# Patient Record
Sex: Male | Born: 1955
Health system: Southern US, Community
[De-identification: ages and names within clinical notes are randomized; demographics above are authoritative.]

## PROBLEM LIST (undated history)

## (undated) DIAGNOSIS — R972 Elevated prostate specific antigen [PSA]: Secondary | ICD-10-CM

## (undated) DIAGNOSIS — Z973 Presence of spectacles and contact lenses: Secondary | ICD-10-CM

## (undated) DIAGNOSIS — N138 Other obstructive and reflux uropathy: Secondary | ICD-10-CM

## (undated) DIAGNOSIS — E7800 Pure hypercholesterolemia, unspecified: Secondary | ICD-10-CM

## (undated) DIAGNOSIS — J40 Bronchitis, not specified as acute or chronic: Secondary | ICD-10-CM

## (undated) DIAGNOSIS — K219 Gastro-esophageal reflux disease without esophagitis: Secondary | ICD-10-CM

## (undated) DIAGNOSIS — R310 Gross hematuria: Secondary | ICD-10-CM

## (undated) DIAGNOSIS — N200 Calculus of kidney: Secondary | ICD-10-CM

## (undated) DIAGNOSIS — H15101 Unspecified episcleritis, right eye: Secondary | ICD-10-CM

## (undated) DIAGNOSIS — F329 Major depressive disorder, single episode, unspecified: Secondary | ICD-10-CM

## (undated) DIAGNOSIS — I10 Essential (primary) hypertension: Secondary | ICD-10-CM

## (undated) DIAGNOSIS — E291 Testicular hypofunction: Secondary | ICD-10-CM

## (undated) DIAGNOSIS — J189 Pneumonia, unspecified organism: Secondary | ICD-10-CM

## (undated) DIAGNOSIS — Z87442 Personal history of urinary calculi: Secondary | ICD-10-CM

## (undated) DIAGNOSIS — N401 Enlarged prostate with lower urinary tract symptoms: Secondary | ICD-10-CM

## (undated) DIAGNOSIS — M1712 Unilateral primary osteoarthritis, left knee: Secondary | ICD-10-CM

## (undated) DIAGNOSIS — L709 Acne, unspecified: Secondary | ICD-10-CM

## (undated) DIAGNOSIS — F32A Depression, unspecified: Secondary | ICD-10-CM

## (undated) DIAGNOSIS — R030 Elevated blood-pressure reading, without diagnosis of hypertension: Secondary | ICD-10-CM

## (undated) DIAGNOSIS — E785 Hyperlipidemia, unspecified: Secondary | ICD-10-CM

## (undated) DIAGNOSIS — K8020 Calculus of gallbladder without cholecystitis without obstruction: Secondary | ICD-10-CM

## (undated) DIAGNOSIS — F909 Attention-deficit hyperactivity disorder, unspecified type: Secondary | ICD-10-CM

## (undated) HISTORY — DX: Unspecified episcleritis, right eye: H15.101

## (undated) HISTORY — DX: Elevated prostate specific antigen (PSA): R97.20

## (undated) HISTORY — DX: Gastro-esophageal reflux disease without esophagitis: K21.9

## (undated) HISTORY — DX: Unilateral primary osteoarthritis, left knee: M17.12

## (undated) HISTORY — DX: Gross hematuria: R31.0

## (undated) HISTORY — PX: COLONOSCOPY: SHX174

## (undated) HISTORY — DX: Benign prostatic hyperplasia with lower urinary tract symptoms: N40.1

## (undated) HISTORY — DX: Major depressive disorder, single episode, unspecified: F32.9

## (undated) HISTORY — PX: PROSTATE BIOPSY: SHX241

## (undated) HISTORY — DX: Attention-deficit hyperactivity disorder, unspecified type: F90.9

## (undated) HISTORY — DX: Calculus of kidney: N20.0

## (undated) HISTORY — DX: Calculus of gallbladder without cholecystitis without obstruction: K80.20

## (undated) HISTORY — DX: Hyperlipidemia, unspecified: E78.5

## (undated) HISTORY — PX: LITHOTRIPSY: SUR834

## (undated) HISTORY — DX: Acne, unspecified: L70.9

## (undated) HISTORY — DX: Elevated blood-pressure reading, without diagnosis of hypertension: R03.0

## (undated) HISTORY — PX: FRACTURE SURGERY: SHX138

## (undated) HISTORY — DX: Depression, unspecified: F32.A

## (undated) HISTORY — DX: Other obstructive and reflux uropathy: N13.8

## (undated) HISTORY — PX: THROAT SURGERY: SHX803

## (undated) HISTORY — DX: Bronchitis, not specified as acute or chronic: J40

## (undated) HISTORY — DX: Testicular hypofunction: E29.1

## (undated) HISTORY — PX: KNEE ARTHROSCOPY: SHX127

## (undated) HISTORY — PX: ROTATOR CUFF REPAIR: SHX139

---
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
## 2002-08-24 HISTORY — PX: ROTATOR CUFF REPAIR: SHX139

## 2006-01-01 ENCOUNTER — Inpatient Hospital Stay (HOSPITAL_COMMUNITY): Admission: EM | Admit: 2006-01-01 | Discharge: 2006-01-04 | Payer: Self-pay | Admitting: Emergency Medicine

## 2007-08-25 DIAGNOSIS — E291 Testicular hypofunction: Secondary | ICD-10-CM

## 2007-08-25 HISTORY — DX: Testicular hypofunction: E29.1

## 2008-05-10 LAB — HM COLONOSCOPY

## 2008-12-07 ENCOUNTER — Encounter: Admission: RE | Admit: 2008-12-07 | Discharge: 2008-12-07 | Payer: Self-pay | Admitting: Orthopedic Surgery

## 2011-01-09 NOTE — H&P (Signed)
NAME:  JT, BRABEC              ACCOUNT NO.:  [id number]   MEDICAL RECORD NO.:  [id number]          PATIENT TYPE:  INP   LOCATION:  1823                         FACILITY:  [facility]   PHYSICIAN:  Jefry H. Pollyann Kennedy, MD     DATE OF BIRTH:  August 08, 1956   DATE OF ADMISSION:  01/01/2006  DATE OF DISCHARGE:                                HISTORY & PHYSICAL   TIME AND PLACE OF ADM:  10 p.m., [facility].   REASON FOR ADMISSION:  Retropharyngeal abscess.   HISTORY:  This is a 55 year old gentleman who began having a severe sore  throat, difficulty swallowing, and difficulty turning his head because of  neck discomfort on Tuesday, 3 days prior to today.  His symptoms have  progressed, gotten worse, and he went to an Urgent Care earlier today where  recommendation was made that he go to the emergency department for workup of  possible meningitis.  In the emergency department, he was found to have a  left shift on his differential but no significantly elevated white blood  cell count.  He was also found on CT scan of the neck to have a  retropharyngeal fluid collection.   PAST MEDICAL HISTORY:  Negative.   PAST SURGICAL HISTORY:  Multiple orthopedic surgeries.   MEDICATIONS:  None.   ALLERGIES:  SULFA causes a rash.   SOCIAL HISTORY:  No history of smoking or alcohol use.  The patient and his  wife and family recently moved here from [place].   PHYSICAL EXAMINATION:  He is a healthy-appearing gentleman lying on his side  guarding his neck and head, trying very hard not to move.  There are no  palpable neck masses.  There is no swelling or erythema of the neck.  There  is no trismus.  Oral cavity and pharynx reveal healthy-appearing tongue,  soft palate, and lateral pharyngeal walls.  There is some fullness of the  posterior pharyngeal wall and the oropharynx.  Nasal exam clear.   IMPRESSION:  Suspected retropharyngeal abscess.   PLAN:  Admit to the hospital.  Perform incision and drainage under anesthesia  in the operating room and then continue on intravenous support and  intravenous antibiotic.      Jefry H. Pollyann Kennedy, MD  Electronically Signed    JHR/MEDQ  D:  01/01/2006  T:  01/01/2006  Job:  [id number]

## 2011-01-09 NOTE — Op Note (Signed)
Carlos Ellis, Carlos Ellis              ACCOUNT NO.:  [id number]   MEDICAL RECORD NO.:  [id number]          PATIENT TYPE:  INP   LOCATION:  3027                         FACILITY:  [facility]   PHYSICIAN:  Jefry H. Pollyann Kennedy, MD     DATE OF BIRTH:  October 06, 1955   DATE OF PROCEDURE:  01/01/2006  DATE OF DISCHARGE:                                 OPERATIVE REPORT   PREOPERATIVE DIAGNOSIS:  Suspected retropharyngeal abscess.   POSTOPERATIVE DIAGNOSIS:  Suspected retropharyngeal abscess.   OPERATION/PROCEDURE:  Incision and drainage of suspected retropharyngeal  abscess.   SURGEON:  Jefry H. Pollyann Kennedy, M.D.   ANESTHESIA:  General endotracheal anesthesia.   COMPLICATIONS:  None.   FINDINGS:  Edematous, boggytype tissue of the posterior pharynx without a  discrete abscess cavity identified.  No complications.   SPECIMENS:  Specimen sent for culture and sensitivity.   DISPOSITION:  The patient tolerated the procedure well.  Was awakened and  extubated and transferred to the recovery room in stable condition.   INDICATIONS:  This is a 55 year old gentleman with a three-day history of  sore throat, difficulty swallowing and trouble turning his head.  The CT  scan imaging in the emergency department revealed food collection in the  retropharyngeal space.  Risks, benefits and alternatives and complications  of the procedure were explained to the patient and his wife who seemed to  understand and agreed to surgery.   DESCRIPTION OF PROCEDURE:  The patient was taken to the operating room and  placed on the operating table in the supine position.  Following induction  of general endotracheal anesthesia, head was turned.  The patient was draped  in the standard fashion.  A Crowe-Davis mouth gag was inserted into the oral  cavity, used for retracting the tongue and mandible and attached to the Mayo  stand. Red rubber catheter was inserted into the right side of the nose and  withdrawn through the mouth and  used to retract the soft palate and uvula.  One percent Xylocaine with epinephrine was infiltrated into the mucosa of  the posterior pharyngeal wall.  An 18-gauge needle was used on a 5 mL  syringe to aspirate in multiple areas to try to obtain sample of the pus.  Very small amount of cloudy fluid was obtained and was sent for culture and  sensitivity testing.  During the aspiration, the needle inadvertently  punctured the endotracheal tube cuff line which deflated the cuff.  The  endotracheal tube was placed without difficulty.  A 15-  scalpel was used to make the vertical incision in the posterior pharyngeal  wall mucosa.  Hemostat was then used to spread open the retropharyngeal  tissues.  Again no distinct abscess cavity was identified.  There was no  bleeding.  The patient was then awakened from anesthesia, extubated and  transferred to recovery in stable condition.      Jefry H. Pollyann Kennedy, MD  Electronically Signed     JHR/MEDQ  D:  01/02/2006  T:  01/04/2006  Job:  [id number]

## 2011-01-09 NOTE — Discharge Summary (Signed)
NAMEBRAYTON, BAUMGARTNER              ACCOUNT NO.:  [id number]   MEDICAL RECORD NO.:  [id number]          PATIENT TYPE:  INP   LOCATION:  3027                         FACILITY:  [facility]   PHYSICIAN:  Jefry H. Pollyann Kennedy, MD     DATE OF BIRTH:  12-17-1955   DATE OF ADMISSION:  01/01/2006  DATE OF DISCHARGE:  01/04/2006                                 DISCHARGE SUMMARY   ADMISSION DIAGNOSIS:  Suspected retropharyngeal abscess.   DISCHARGE DIAGNOSIS:  Status post incision and drainage of retropharyngeal  cellulitis.   PROCEDURE DURING STAY:  Incision and drainage of retropharyngeal space.   COMPLICATIONS:  None.   CONSULTATIONS:  None.   HISTORY:  This is a 55 year old who was admitted through the emergency  department  on May 11th with clinical evidence of retropharyngeal space  abscess. He underwent the above-mentioned procedure on the day of admission.  Following that he remained in the hospital and general nursing floor on  intravenous antibiotics and slowly improved to where he was able to turn his  head to the side better, able to eat and drink a little bit better. He was  discharged to home on the 14th with instructions to continue on Augmentin on  10 days, use narcotic analgesics that were prescribed and to follow up with  me later in the week.      Jefry H. Pollyann Kennedy, MD  Electronically Signed     JHR/MEDQ  D:  01/04/2006  T:  01/04/2006  Job:  [id number]

## 2011-05-18 ENCOUNTER — Encounter: Payer: Self-pay | Admitting: Family Medicine

## 2011-05-18 ENCOUNTER — Ambulatory Visit (INDEPENDENT_AMBULATORY_CARE_PROVIDER_SITE_OTHER): Payer: BC Managed Care – PPO | Admitting: Family Medicine

## 2011-05-18 ENCOUNTER — Telehealth: Payer: Self-pay | Admitting: Family Medicine

## 2011-05-18 VITALS — BP 118/78 | HR 88 | Temp 97.7°F | Wt 260.0 lb

## 2011-05-18 DIAGNOSIS — E785 Hyperlipidemia, unspecified: Secondary | ICD-10-CM

## 2011-05-18 DIAGNOSIS — Z23 Encounter for immunization: Secondary | ICD-10-CM

## 2011-05-18 DIAGNOSIS — E291 Testicular hypofunction: Secondary | ICD-10-CM

## 2011-05-18 MED ORDER — TESTOSTERONE 12.5 MG/ACT (1%) TD GEL: 5.0000 g | Freq: Every day | TRANSDERMAL | Status: DC

## 2011-05-18 MED ORDER — TESTOSTERONE CYPIONATE 200 MG/ML IM SOLN: 200.0000 mg | INTRAMUSCULAR | Status: AC

## 2011-05-18 NOTE — Telephone Encounter (Signed)
Pls request records from [facility] in [place].  Thx--PM

## 2011-05-18 NOTE — Assessment & Plan Note (Signed)
Will check prolactin and FLP as soon as he can. Rx's given for Androgel and testosterone injections: he'll see which of these will be the most viable option financially when he goes to his pharmacy (he has a very high rx med deductable). If he chooses androgel, he'll come for next f/u in about 2-3 wks for o/v and testosterone level. If he chooses testost injections, he'll come in here for first one, then have his wife give the next one in 2 wks, then f/u in office for o/v and testos level 1 week after this second injection.

## 2011-05-18 NOTE — Assessment & Plan Note (Signed)
Obtain old records. Check FLP ASAP.

## 2011-05-18 NOTE — Progress Notes (Signed)
Office Note 05/18/2011  CC:  Chief Complaint  Patient presents with  . Establish Care    low testosterone    HPI:  Carlos Ellis is a 55 y.o. White male who is here to establish care and discuss low testosterone. Patient's most recent primary MD: Brooks Sailors in [place]. Old records (labs from NP at [facility]) were reviewed during today's visit.  Has suffered from mild depression, lack of motivation, poor energy level over the last couple of years, somewhat associated with poor economy and job seeking. Also has hx of low testosterone a few years ago and took injections for a while but due to lack of effective communication at his former PMD office he decided to d/c the med.  Says he never felt much improvement from it anyway.  At counseling center recently, the NP checked some labs and his testosterone came back low again and they recommended he f/u with his primary care MD about this.  Denies ED.    Past Medical History  Diagnosis Date  . Depression   . Borderline hyperlipidemia     Past Surgical History  Procedure Date  . Knee arthroscopy     Left: 1990s and early 2000's.  . Rotator cuff repair     Right  . Lithotripsy     1990s  . Throat surgery     Infected branchial cleft cyst 2007  . Colonoscopy     X 2, normal (for strong FH of colon cancer.  Last in 2009 was normal.  Repeat 5 yrs ([facility]).    No family history on file.  History   Social History  . Marital Status: Married    Spouse Name: N/A    Number of Children: N/A  . Years of Education: N/A   Occupational History  . Not on file.   Social History Main Topics  . Smoking status: Never Smoker   . Smokeless tobacco: Never Used  . Alcohol Use: 1.8 oz/week    3 Glasses of wine per week  . Drug Use: No  . Sexually Active: Not on file   Other Topics Concern  . 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  Drinks 2-3 glasses of wine per week.  No drugs.NO exercise.    Outpatient Encounter Prescriptions as of 05/18/2011  Medication Sig Dispense Refill  . citalopram (CELEXA) 20 MG tablet Take 20 mg by mouth daily.        . Testosterone (ANDROGEL PUMP) 1.25 GM/ACT (1%) GEL Place 5 g onto the skin daily.  75 g  5  . testosterone cypionate (DEPOTESTOTERONE CYPIONATE) 200 MG/ML injection Inject 1 mL (200 mg total) into the muscle every 14 (fourteen) days.  10 mL  5    Allergies  Allergen Reactions  . Sulfa Antibiotics     Can't remember reaction    ROS Review of Systems  Constitutional: Negative for fever and fatigue.  HENT: Negative for congestion and sore throat.   Eyes: Negative for visual disturbance.  Respiratory: Negative for cough.   Cardiovascular: Negative for chest pain.  Gastrointestinal: Negative for nausea and abdominal pain.  Genitourinary: Negative for dysuria.  Musculoskeletal: Negative for back pain and joint swelling.  Skin: Negative for rash.  Neurological: Negative for weakness and headaches.  Hematological:  Negative for adenopathy.     PE; Blood pressure 118/78, pulse 88, temperature 97.7 F (36.5 C), temperature source Oral, weight 260 lb (117.935 kg), SpO2 95.00%. Gen: Alert, well appearing.  Patient is oriented to person, place, time, and situation. HEENT: Scalp without lesions or hair loss.  Ears: EACs clear, normal epithelium.  TMs with good light reflex and landmarks bilaterally.  Eyes: no injection, icteris, swelling, or exudate.  EOMI, PERRLA. Nose: no drainage or turbinate edema/swelling.  No injection or focal lesion.  Mouth: lips without lesion/swelling.  Oral mucosa pink and moist.  Dentition intact and without obvious caries or gingival swelling.  Oropharynx without erythema, exudate, or swelling.  Neck: supple,  ROM full. No lymphadenopathy, thyromegaly, or mass. Chest: symmetric expansion, nonlabored respirations.  Clear and equal breath sounds in all lung fields.   CV: RRR, no m/r/g.  Peripheral pulses 2+ and symmetric. ABD: soft, NT, ND, BS normal.  No hepatospenomegaly or mass.  No bruits. EXT: no clubbing, cyanosis, or edema.   Pertinent labs:  None here today. Testost 05/13/11 via [facility] was 203.52 ng/dl.  Remainder of labs from that date were wnl (CBC, CMET, TSH, T4, vit B12, folate, and vitamin D).  ASSESSMENT AND PLAN:   New patient: obtain old records.  Hypogonadism, male Will check prolactin and FLP as soon as he can. Rx's given for Androgel and testosterone injections: he'll see which of these will be the most viable option financially when he goes to his pharmacy (he has a very high rx med deductable). If he chooses androgel, he'll come for next f/u in about 2-3 wks for o/v and testosterone level. If he chooses testost injections, he'll come in here for first one, then have his wife give the next one in 2 wks, then f/u in office for o/v and testos level 1 week after this second injection.  Borderline hyperlipidemia Obtain old records. Check FLP ASAP.   Depression: recently started on citalopram by therapist/NP at [facility], FNP.  Masoud Hejazi is the MD there but patient states he has not seen him).  I told patient he could f/u with them or with me for this problem, either one is fine.  Return for lab visit for fasting lipids and prolactin level ASAP, and office follow up to be determined.

## 2011-05-19 ENCOUNTER — Other Ambulatory Visit (INDEPENDENT_AMBULATORY_CARE_PROVIDER_SITE_OTHER): Payer: BC Managed Care – PPO

## 2011-05-19 DIAGNOSIS — E785 Hyperlipidemia, unspecified: Secondary | ICD-10-CM

## 2011-05-19 DIAGNOSIS — E291 Testicular hypofunction: Secondary | ICD-10-CM

## 2011-05-19 LAB — PROLACTIN: Prolactin: 7.2 ng/mL (ref 2.1–17.1)

## 2011-05-19 LAB — LIPID PANEL
Cholesterol: 160 mg/dL (ref 0–200)
HDL: 48 mg/dL (ref >39.00)
LDL Cholesterol: 98 mg/dL (ref 0–99)
Total CHOL/HDL Ratio: 3
Triglycerides: 69 mg/dL (ref 0.0–149.0)
VLDL: 13.8 mg/dL (ref 0.0–40.0)

## 2011-05-19 NOTE — Telephone Encounter (Signed)
Faxed request

## 2011-05-26 ENCOUNTER — Encounter: Payer: Self-pay | Admitting: Family Medicine

## 2011-06-02 ENCOUNTER — Telehealth: Payer: Self-pay | Admitting: Family Medicine

## 2011-06-02 NOTE — Telephone Encounter (Signed)
Pt will have injections.  Appt scheduled for tomorrow at 10:00am.  Advised next OV will be after 2nd injection.

## 2011-06-02 NOTE — Telephone Encounter (Signed)
Patient is asking when he should schedule his "injection" and his next OV

## 2011-06-03 ENCOUNTER — Ambulatory Visit: Payer: BC Managed Care – PPO

## 2011-06-04 ENCOUNTER — Encounter: Payer: Self-pay | Admitting: Family Medicine

## 2011-10-06 ENCOUNTER — Encounter: Payer: Self-pay | Admitting: Family Medicine

## 2011-10-06 ENCOUNTER — Ambulatory Visit (INDEPENDENT_AMBULATORY_CARE_PROVIDER_SITE_OTHER): Payer: Managed Care, Other (non HMO) | Admitting: Family Medicine

## 2011-10-06 DIAGNOSIS — L259 Unspecified contact dermatitis, unspecified cause: Secondary | ICD-10-CM

## 2011-10-06 DIAGNOSIS — L309 Dermatitis, unspecified: Secondary | ICD-10-CM

## 2011-10-06 DIAGNOSIS — J40 Bronchitis, not specified as acute or chronic: Secondary | ICD-10-CM

## 2011-10-06 HISTORY — DX: Bronchitis, not specified as acute or chronic: J40

## 2011-10-06 MED ORDER — NYSTATIN 100000 UNIT/GM EX CREA: TOPICAL_CREAM | Freq: Two times a day (BID) | CUTANEOUS | Status: DC

## 2011-10-06 MED ORDER — HYDROCOD POLST-CHLORPHEN POLST 10-8 MG/5ML PO LQCR: 5.0000 mL | Freq: Two times a day (BID) | ORAL | Status: DC | PRN

## 2011-10-06 MED ORDER — AMOXICILLIN-POT CLAVULANATE 875-125 MG PO TABS: 1.0000 | ORAL_TABLET | Freq: Two times a day (BID) | ORAL | Status: AC

## 2011-10-06 MED ORDER — GUAIFENESIN ER 600 MG PO TB12: 1200.0000 mg | ORAL_TABLET | Freq: Two times a day (BID) | ORAL | Status: DC

## 2011-10-06 MED ORDER — ALIGN PO CAPS: 1.0000 | ORAL_CAPSULE | Freq: Every day | ORAL | Status: DC

## 2011-10-06 NOTE — Patient Instructions (Signed)

## 2011-10-07 ENCOUNTER — Encounter: Payer: Self-pay | Admitting: Family Medicine

## 2011-10-07 DIAGNOSIS — K219 Gastro-esophageal reflux disease without esophagitis: Secondary | ICD-10-CM | POA: Insufficient documentation

## 2011-10-07 DIAGNOSIS — F329 Major depressive disorder, single episode, unspecified: Secondary | ICD-10-CM | POA: Insufficient documentation

## 2011-10-07 DIAGNOSIS — H15101 Unspecified episcleritis, right eye: Secondary | ICD-10-CM | POA: Insufficient documentation

## 2011-10-07 DIAGNOSIS — F32A Depression, unspecified: Secondary | ICD-10-CM | POA: Insufficient documentation

## 2011-10-07 DIAGNOSIS — M1990 Unspecified osteoarthritis, unspecified site: Secondary | ICD-10-CM | POA: Insufficient documentation

## 2011-10-07 DIAGNOSIS — L709 Acne, unspecified: Secondary | ICD-10-CM | POA: Insufficient documentation

## 2011-10-07 DIAGNOSIS — N200 Calculus of kidney: Secondary | ICD-10-CM | POA: Insufficient documentation

## 2011-10-07 DIAGNOSIS — M109 Gout, unspecified: Secondary | ICD-10-CM | POA: Insufficient documentation

## 2011-10-07 NOTE — Assessment & Plan Note (Signed)
Increase rest and fluids, start Augmentin and Mucinex and report if symptoms worsen

## 2011-10-07 NOTE — Progress Notes (Signed)
Patient ID: Carlos Ellis, male   DOB: [birth year], 56 y.o.   MRN: [id number] Carlos Ellis [id number] [birth year] 10/07/2011      Progress Note-Follow Up  Subjective  Chief Complaint  Chief Complaint  Patient presents with  . URI    HPI  Patient is a 56 year old Caucasian male who is in today with roughly two-week history of worsening respiratory symptoms. Has been struggling with head congestion and rhinorrhea. Malaise and myalgias also present on and off for the last several days it moved into his chest and he is having more coughing and rattling in his chest. No high-grade fevers and chills but some low-grade ones for possible. No chest pain or palpitations. No shortness of breath or wheezing. No ear pain but some throat irritation is noted. He has not taken any significant medications thus far for that illness but is concerned about the length of time he's had the symptoms.  Past Medical History  Diagnosis Date  . Borderline hyperlipidemia   . Gout   . Acne     Dr. Katrinka Blazing (was on accutane at one point)  . Episcleritis of right eye     w/mild scleritis right eye ([facility] opht 2011); also with hx of ocular varicella at age 56.  . Hypogonadism male 2009  . Bronchitis 10/06/2011  . Osteoarthritis     Dr. Turner Daniels  . Depression     Zoloft 2005  . GERD (gastroesophageal reflux disease)   . Nephrolithiasis     Past Surgical History  Procedure Date  . Knee arthroscopy     Left: 1990s and early 2000's.  . Rotator cuff repair     Right  . Lithotripsy     1990s  . Throat surgery     Infected branchial cleft cyst 2007  . Colonoscopy     X 2, normal (for strong FH of colon cancer.  Last in 2009 was normal.  Repeat 10 yrs ([facility]).    History reviewed. No pertinent family history.  History   Social History  . Marital Status: Married    Spouse Name: N/A    Number of Children: N/A  . Years of Education: N/A   Occupational History  . Not on file.   Social History Main Topics    . Smoking status: Never Smoker   . Smokeless tobacco: Never Used  . Alcohol Use: 1.8 oz/week    3 Glasses of wine per week  . Drug Use: No  . Sexually Active: Not on file   Other Topics Concern  . 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  Drinks 2-3 glasses of wine per week.  No drugs.NO exercise.    Current Outpatient Prescriptions on File Prior to Visit  Medication Sig Dispense Refill  . citalopram (CELEXA) 20 MG tablet Take 20 mg by mouth daily.        . Testosterone (ANDROGEL PUMP) 1.25 GM/ACT (1%) GEL Place 5 g onto the skin daily.  75 g  5    Allergies  Allergen Reactions  . Sulfa Antibiotics     Can't remember reaction    Review of Systems  Review of Systems  Constitutional: Positive for malaise/fatigue. Negative for fever.  HENT: Positive for congestion and sore throat.   Eyes: Negative for discharge.  Respiratory: Positive  for cough and sputum production. Negative for shortness of breath.   Cardiovascular: Negative for chest pain, palpitations and leg swelling.  Gastrointestinal: Negative for nausea, abdominal pain and diarrhea.  Genitourinary: Negative for dysuria.  Musculoskeletal: Positive for myalgias. Negative for falls.  Skin: Negative for rash.  Neurological: Positive for headaches. Negative for loss of consciousness.  Endo/Heme/Allergies: Negative for polydipsia.  Psychiatric/Behavioral: Negative for depression and suicidal ideas. The patient is not nervous/anxious and does not have insomnia.     Objective  BP 132/85  Pulse 92  Temp(Src) 99.6 F (37.6 C) (Temporal)  Wt 263 lb (119.296 kg)  SpO2 96%  Physical Exam  Physical Exam  Constitutional: He is oriented to person, place, and time and well-developed, well-nourished, and in no  distress. No distress.  HENT:  Head: Normocephalic and atraumatic.  Eyes: Conjunctivae are normal.  Neck: Neck supple. No thyromegaly present.  Cardiovascular: Normal rate, regular rhythm and normal heart sounds.   No murmur heard. Pulmonary/Chest: Effort normal and breath sounds normal. No respiratory distress.       Coughing, decreased bs b/l bases  Abdominal: He exhibits no distension and no mass. There is no tenderness.  Musculoskeletal: He exhibits no edema.  Neurological: He is alert and oriented to person, place, and time.  Skin: Skin is warm.  Psychiatric: Memory, affect and judgment normal.     Lab Results  Component Value Date   CHOL 160 05/19/2011   Lab Results  Component Value Date   HDL 48.00 05/19/2011   Lab Results  Component Value Date   LDLCALC 98 05/19/2011   Lab Results  Component Value Date   TRIG 69.0 05/19/2011   Lab Results  Component Value Date   CHOLHDL 3 05/19/2011     Assessment & Plan  Bronchitis Increase rest and fluids, start Augmentin and Mucinex and report if symptoms worsen

## 2012-02-12 ENCOUNTER — Encounter: Payer: Self-pay | Admitting: Family Medicine

## 2012-02-12 ENCOUNTER — Ambulatory Visit (INDEPENDENT_AMBULATORY_CARE_PROVIDER_SITE_OTHER): Payer: Managed Care, Other (non HMO) | Admitting: Family Medicine

## 2012-02-12 ENCOUNTER — Ambulatory Visit: Payer: Managed Care, Other (non HMO)

## 2012-02-12 VITALS — BP 127/80 | HR 83 | Temp 97.2°F | Wt 260.0 lb

## 2012-02-12 DIAGNOSIS — M1990 Unspecified osteoarthritis, unspecified site: Secondary | ICD-10-CM

## 2012-02-12 DIAGNOSIS — Z0000 Encounter for general adult medical examination without abnormal findings: Secondary | ICD-10-CM

## 2012-02-12 DIAGNOSIS — E291 Testicular hypofunction: Secondary | ICD-10-CM

## 2012-02-12 MED ORDER — MELOXICAM 7.5 MG PO TABS: ORAL_TABLET | ORAL | Status: DC

## 2012-02-12 NOTE — Progress Notes (Signed)
Office Note 02/14/2012  CC:  Chief Complaint  Patient presents with  . Annual Exam    BSA physical form    HPI:  Carlos Ellis is a 56 y.o. White male who is here for CPE/ [employer] form to fill out.  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   Reports no new problems.  Still struggling with his weight, depression is stable, feels like his left knee pains are bothering him more lately: he describes intermittent pain, locking/catching with deep flexion and extension and says it does not swell or turn red.  No giving way. He tries advil occasionally but says past rx of mobic helped quite a bit and asks to get back on this, plus he says he'll likely go back to Dr. Turner Daniels, his orthopedist, soon.     Past Medical History  Diagnosis Date  . Borderline hyperlipidemia   . Gout   . Acne     Dr. Katrinka Blazing (was on accutane at one point)  . Episcleritis of right eye     w/mild scleritis right eye ([facility] opht 2011); also with hx of ocular varicella at age 19.  . Hypogonadism male 2009  . Bronchitis 10/06/2011  . Osteoarthritis     Dr. Turner Daniels  . Depression     Zoloft 2005  . GERD (gastroesophageal reflux disease)   . Nephrolithiasis     Past Surgical History  Procedure Date  . Knee arthroscopy     Left: 1990s and early 2000's.  . Rotator cuff repair     Right  . Lithotripsy     1990s  . Throat surgery     Infected branchial cleft cyst 2007  . Colonoscopy     X 2, normal (for strong FH of colon cancer.  Last in 2009 was normal.  Repeat 10 yrs ([facility]).    History reviewed. No pertinent family history.  History   Social History  . Marital Status: Married    Spouse Name: N/A    Number of Children: N/A  . Years of Education: N/A   Occupational History  . Not on file.   Social History Main Topics  . Smoking status: Never Smoker   . Smokeless tobacco: Never Used  . Alcohol Use: 1.8 oz/week    3 Glasses of wine per week    . Drug Use: No  . Sexually Active: Not on file   Other Topics Concern  . 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  Drinks 2-3 glasses of wine per week.  No drugs.NO exercise.    MEDS: citalopram 20mg  qd, advil prn,   Allergies  Allergen Reactions  . Sulfa Antibiotics     Can't remember reaction    ROS Review of Systems  Constitutional: Negative for fever, chills, appetite change and fatigue.  HENT: Negative for ear pain, congestion, sore throat, neck stiffness and dental problem.   Eyes: Negative for discharge, redness and visual disturbance.  Respiratory: Negative for cough, chest tightness, shortness of breath and wheezing.   Cardiovascular: Negative for chest pain, palpitations and leg swelling.  Gastrointestinal: Negative for nausea, vomiting, abdominal pain, diarrhea and blood in stool.  Genitourinary: Negative for dysuria, urgency, frequency, hematuria, flank pain and difficulty urinating.  Musculoskeletal: Positive for  arthralgias (left knee). Negative for myalgias, back pain and joint swelling.  Skin: Negative for pallor and rash.  Neurological: Negative for dizziness, speech difficulty, weakness and headaches.  Hematological: Negative for adenopathy. Does not bruise/bleed easily.  Psychiatric/Behavioral: Negative for confusion and disturbed wake/sleep cycle. The patient is not nervous/anxious.     PE; Blood pressure 127/80, pulse 83, temperature 97.2 F (36.2 C), temperature source Temporal, weight 260 lb (117.935 kg). Gen: Alert, well appearing.  Patient is oriented to person, place, time, and situation. ENT: Ears: EACs clear, normal epithelium.  TMs with good light reflex and landmarks bilaterally.  Eyes: no injection, icteris, swelling, or exudate.   EOMI, PERRLA. Nose: no drainage or turbinate edema/swelling.  No injection or focal lesion.  Mouth: lips without lesion/swelling.  Oral mucosa pink and moist.  Dentition intact and without obvious caries or gingival swelling.  Oropharynx without erythema, exudate, or swelling.  Neck: supple/nontender.  No LAD, mass, or TM.  Carotid pulses 2+ bilaterally, without bruits. CV: RRR, no m/r/g.   LUNGS: CTA bilat, nonlabored resps, good aeration in all lung fields. ABD: soft, NT, ND, BS normal.  No hepatospenomegaly or mass.  No bruits. EXT: no clubbing, cyanosis, or edema.  Musculoskeletal: no joint swelling, erythema, warmth, or tenderness.  ROM of all joints intact. Left knee with mild medial joint line tenderness.  No instability.  Varus and valgus stress testing reveal no pain or laxity. Neuro: CN 2-12 intact bilaterally, strength 5/5 in proximal and distal upper extremities and lower extremities bilaterally.  No sensory deficits.  No tremor.  No disdiadochokinesis.  No ataxia.  Upper extremity and lower extremity DTRs symmetric.  No pronator drift. Genitals normal; both testes normal without tenderness, masses, hydroceles, varicoceles, erythema or swelling. Shaft normal, circumcised, meatus normal without discharge. No inguinal hernia noted. No inguinal lymphadenopathy. Rectal exam: negative without mass, lesions or tenderness.  Prostate without enlargement, tenderness, or nodule.  Pertinent labs:  Lab Results  Component Value Date   CHOL 160 05/19/2011   HDL 48.00 05/19/2011   LDLCALC 98 05/19/2011   TRIG 69.0 05/19/2011   CHOLHDL 3 05/19/2011     ASSESSMENT AND PLAN:   Health maintenance examination Reviewed age and gender appropriate health maintenance issues (prudent diet, regular exercise, health risks of tobacco and excessive alcohol, use of seatbelts, fire alarms in home, use of sunscreen).  Also reviewed age and gender appropriate health screening as well as vaccine  recommendations. He's UTD on vaccines and routine screening blood work.   Recheck in office in 3-4 mo for routine f/u and fasting labs.  Osteoarthritis Will restart meloxicam 7.5mg -15mg  qd prn. Encouraged pt to go back to Dr. Turner Daniels, his orthopedist, to get reappraisal of the knee and see if anything else needs to be pursued regarding treatment/therapy.  Hypogonadism, male He wants to return and restart testosterone injections.  After initial shot by [facility], he thinks he will decide to contine q2 wk injections but give them himself at home.    FOLLOW UP:  Return for office f/u + fasting labs in 3-4 months.  Nurse visit for testosterone injection at his convenience.Marland Kitchen

## 2012-02-14 DIAGNOSIS — Z0000 Encounter for general adult medical examination without abnormal findings: Secondary | ICD-10-CM | POA: Insufficient documentation

## 2012-02-14 NOTE — Assessment & Plan Note (Signed)
Will restart meloxicam 7.5mg -15mg  qd prn. Encouraged pt to go back to Dr. Turner Daniels, his orthopedist, to get reappraisal of the knee and see if anything else needs to be pursued regarding treatment/therapy.

## 2012-02-14 NOTE — Assessment & Plan Note (Signed)
He wants to return and restart testosterone injections.  After initial shot by [facility], he thinks he will decide to contine q2 wk injections but give them himself at home.

## 2012-02-14 NOTE — Assessment & Plan Note (Signed)
Reviewed age and gender appropriate health maintenance issues (prudent diet, regular exercise, health risks of tobacco and excessive alcohol, use of seatbelts, fire alarms in home, use of sunscreen).  Also reviewed age and gender appropriate health screening as well as vaccine recommendations. He's UTD on vaccines and routine screening blood work.   Recheck in office in 3-4 mo for routine f/u and fasting labs.

## 2012-05-13 ENCOUNTER — Ambulatory Visit: Payer: Managed Care, Other (non HMO) | Admitting: Family Medicine

## 2013-08-24 DIAGNOSIS — R972 Elevated prostate specific antigen [PSA]: Secondary | ICD-10-CM

## 2013-08-24 DIAGNOSIS — N138 Other obstructive and reflux uropathy: Secondary | ICD-10-CM

## 2013-08-24 HISTORY — DX: Elevated prostate specific antigen (PSA): R97.20

## 2013-08-24 HISTORY — DX: Benign prostatic hyperplasia with lower urinary tract symptoms: N13.8

## 2013-10-13 ENCOUNTER — Ambulatory Visit (INDEPENDENT_AMBULATORY_CARE_PROVIDER_SITE_OTHER): Payer: BC Managed Care – PPO | Admitting: Family Medicine

## 2013-10-13 ENCOUNTER — Encounter: Payer: Self-pay | Admitting: Family Medicine

## 2013-10-13 VITALS — BP 148/85 | HR 80 | Temp 98.0°F | Resp 16 | Ht 71.5 in | Wt 266.0 lb

## 2013-10-13 DIAGNOSIS — Z205 Contact with and (suspected) exposure to viral hepatitis: Secondary | ICD-10-CM

## 2013-10-13 DIAGNOSIS — Z0000 Encounter for general adult medical examination without abnormal findings: Secondary | ICD-10-CM

## 2013-10-13 DIAGNOSIS — Z23 Encounter for immunization: Secondary | ICD-10-CM

## 2013-10-13 DIAGNOSIS — Z20828 Contact with and (suspected) exposure to other viral communicable diseases: Secondary | ICD-10-CM

## 2013-10-13 DIAGNOSIS — Z125 Encounter for screening for malignant neoplasm of prostate: Secondary | ICD-10-CM

## 2013-10-13 LAB — LIPID PANEL
Cholesterol: 181 mg/dL (ref 0–200)
HDL: 48.5 mg/dL (ref >39.00)
LDL Cholesterol: 113 mg/dL — ABNORMAL HIGH (ref 0–99)
Total CHOL/HDL Ratio: 4
Triglycerides: 100 mg/dL (ref 0.0–149.0)
VLDL: 20 mg/dL (ref 0.0–40.0)

## 2013-10-13 LAB — CBC WITH DIFFERENTIAL/PLATELET
Basophils Absolute: 0 10*3/uL (ref 0.0–0.1)
Basophils Relative: 0.4 % (ref 0.0–3.0)
Eosinophils Absolute: 0.1 10*3/uL (ref 0.0–0.7)
Eosinophils Relative: 1.6 % (ref 0.0–5.0)
HCT: 46.8 % (ref 39.0–52.0)
Hemoglobin: 15.5 g/dL (ref 13.0–17.0)
Lymphocytes Relative: 17.3 % (ref 12.0–46.0)
Lymphs Abs: 1.4 10*3/uL (ref 0.7–4.0)
MCHC: 33 g/dL (ref 30.0–36.0)
MCV: 90.3 fl (ref 78.0–100.0)
Monocytes Absolute: 0.5 10*3/uL (ref 0.1–1.0)
Monocytes Relative: 6.1 % (ref 3.0–12.0)
Neutro Abs: 6.2 10*3/uL (ref 1.4–7.7)
Neutrophils Relative %: 74.6 % (ref 43.0–77.0)
Platelets: 293 10*3/uL (ref 150.0–400.0)
RBC: 5.19 Mil/uL (ref 4.22–5.81)
RDW: 14 % (ref 11.5–14.6)
WBC: 8.3 10*3/uL (ref 4.5–10.5)

## 2013-10-13 LAB — PSA: PSA: 3.65 ng/mL (ref 0.10–4.00)

## 2013-10-13 LAB — COMPREHENSIVE METABOLIC PANEL
ALT: 36 U/L (ref 0–53)
AST: 27 U/L (ref 0–37)
Albumin: 4.2 g/dL (ref 3.5–5.2)
Alkaline Phosphatase: 54 U/L (ref 39–117)
BUN: 12 mg/dL (ref 6–23)
CO2: 24 mEq/L (ref 19–32)
Calcium: 9.3 mg/dL (ref 8.4–10.5)
Chloride: 108 mEq/L (ref 96–112)
Creatinine, Ser: 0.9 mg/dL (ref 0.4–1.5)
GFR: 90.5 mL/min (ref >60.00)
Glucose, Bld: 101 mg/dL — ABNORMAL HIGH (ref 70–99)
Potassium: 4.5 mEq/L (ref 3.5–5.1)
Sodium: 139 mEq/L (ref 135–145)
Total Bilirubin: 0.6 mg/dL (ref 0.3–1.2)
Total Protein: 6.6 g/dL (ref 6.0–8.3)

## 2013-10-13 LAB — TSH: TSH: 3.48 u[IU]/mL (ref 0.35–5.50)

## 2013-10-13 MED ORDER — MELOXICAM 7.5 MG PO TABS: ORAL_TABLET | ORAL | Status: DC

## 2013-10-13 NOTE — Patient Instructions (Signed)
Monitor bp outside of office (your drugstore) 3-5 times over the next 1-2 weeks. Call or return if average top number is >140 or average bottom number is > 90. Remember to rest for 10 min prior to checking your bp.

## 2013-10-13 NOTE — Addendum Note (Signed)
Addended by: Eulah PontALBRIGHT, Matai Carpenito M on: 10/13/2013 02:44 PM   Modules accepted: Orders

## 2013-10-13 NOTE — Progress Notes (Signed)
Office Note 10/13/2013  CC:  Chief Complaint  Patient presents with  . Annual Exam    HPI:  Carlos Ellis is a 58 y.o. White male who is here for CPE. Last CPE was 02/12/12 and this is the last time I saw him.   Still some left knee arthritis ("bone on bone" per pt) for which he gets occ steroid injection--most recent with Dr. Turner Daniels was fall 2014. He got his screening labs done this morning.  Has decreased urine stream on many mornings, without pain or straining involved.  As the day goes on this improves.  No nocturia.  No feeling of incomplete emptying or daytime urinary urgency or frequency.  No hematuria.   Past Medical History  Diagnosis Date  . Borderline hyperlipidemia   . Gout   . Acne     Dr. Katrinka Blazing (was on accutane at one point)  . Episcleritis of right eye     w/mild scleritis right eye ([facility] opht 2011); also with hx of ocular varicella at age 8.  . Hypogonadism male 2009  . Bronchitis 10/06/2011  . Osteoarthritis of left knee     Dr. Turner Daniels  . Depression     Zoloft 2005  . GERD (gastroesophageal reflux disease)   . Nephrolithiasis     Past Surgical History  Procedure Laterality Date  . Knee arthroscopy      Left: 1990s and early 2000's.  . Rotator cuff repair      Right  . Lithotripsy      1990s  . Throat surgery      Infected branchial cleft cyst 2007  . Colonoscopy      X 2, normal (for strong FH of colon cancer.  Last in 2009 was normal.  Repeat 10 yrs ([facility]).    History reviewed. No pertinent family history.  History   Social History  . Marital Status: Married    Spouse Name: N/A    Number of Children: N/A  . Years of Education: N/A   Occupational History  . Not on file.   Social History Main Topics  . Smoking status: Never Smoker   . Smokeless tobacco: Never Used  . Alcohol Use: 1.8 oz/week    3 Glasses of wine per week  . Drug Use: No  . Sexual Activity: Not on file   Other Topics Concern  . Not on file   Social History  Narrative   Married, 2 teenage children.   Relocated to [place] from [place] 2007.   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   No tobacco.  Drinks 2-3 glasses of wine per week.  No drugs.   Walks his dog about 1 mile per day.   MEDS: ibuprofen prn  Allergies  Allergen Reactions  . Sulfa Antibiotics     Can't remember reaction  *-  ROS Review of Systems  Constitutional: Negative for fever, chills, appetite change and fatigue.  HENT: Negative for congestion, dental problem, ear pain and sore throat.   Eyes: Negative for discharge, redness and visual disturbance.  Respiratory: Negative for cough, chest tightness, shortness of breath and wheezing.   Cardiovascular: Negative for chest pain, palpitations and leg swelling.  Gastrointestinal: Negative for nausea, vomiting, abdominal pain, diarrhea and blood in stool.  Genitourinary: Positive for difficulty urinating (as per HPI). Negative for dysuria, urgency, frequency, hematuria and flank pain.  Musculoskeletal: Positive for arthralgias (left knee-as per HPI). Negative for back pain,  joint swelling, myalgias and neck stiffness.  Skin: Negative for pallor and rash.  Neurological: Negative for dizziness, speech difficulty, weakness and headaches.  Hematological: Negative for adenopathy. Does not bruise/bleed easily.  Psychiatric/Behavioral: Negative for confusion and sleep disturbance. The patient is not nervous/anxious.     PE; Blood pressure 148/85, pulse 80, temperature 98 F (36.7 C), temperature source Oral, resp. rate 16, height 5' 11.5" (1.816 m), weight 266 lb (120.657 kg), SpO2 97.00%. Gen: Alert, well appearing, overweight-appearing.  Patient is oriented to person, place, time, and situation. AFFECT: pleasant, lucid thought and speech. ENT: Ears: EACs clear, normal epithelium.  TMs with good light reflex and landmarks bilaterally.  Eyes: no injection, icteris, swelling, or  exudate.  EOMI, PERRLA. Nose: no drainage or turbinate edema/swelling.  No injection or focal lesion.  Mouth: lips without lesion/swelling.  Oral mucosa pink and moist.  Dentition intact and without obvious caries or gingival swelling.  Oropharynx without erythema, exudate, or swelling.  Neck: supple/nontender.  No LAD, mass, or TM.  Carotid pulses 2+ bilaterally, without bruits. CV: RRR, no m/r/g.   LUNGS: CTA bilat, nonlabored resps, good aeration in all lung fields. ABD: soft, NT, ND, BS normal.  No hepatospenomegaly or mass.  No bruits. EXT: no clubbing, cyanosis, or edema.  Musculoskeletal: no joint swelling, erythema, warmth, or tenderness.  ROM of all joints intact. Skin - no sores or suspicious lesions or rashes or color changes Rectal exam: negative without mass, lesions or tenderness, PROSTATE EXAM: smooth and symmetric without nodules or tenderness.   Pertinent labs:  Health panel and PSA are pending  ASSESSMENT AND PLAN:   Health maintenance examination Reviewed age and gender appropriate health maintenance issues (prudent diet, regular exercise, health risks of tobacco and excessive alcohol, use of seatbelts, fire alarms in home, use of sunscreen).  Also reviewed age and gender appropriate health screening as well as vaccine recommendations. HP and PSA drawn today. DRE normal today. Colon cancer screening UTD. Flu vaccine IM today.  Borderline elevated bp today: Monitor bp outside of office (your drugstore) 3-5 times over the next 1-2 weeks. Call or return if average top number is >140 or average bottom number is > 90. Remember to rest for 10 min prior to checking your bp.  He brings up something about contact with a coworker "years ago" who had hepatitis---doesn't know what kind--says "they came in after that and gave everybody shots".  Also recalls being told after that at a blood bank that he could not donate blood.  He is not sure about f/u testing so I added hep panel to  labs today.   An After Visit Summary was printed and given to the patient.  FOLLOW UP:  Return in about 1 year (around 10/13/2014) for annual CPE (fasting).

## 2013-10-13 NOTE — Progress Notes (Signed)
Pre visit review using our clinic review tool, if applicable. No additional management support is needed unless otherwise documented below in the visit note. 

## 2013-10-13 NOTE — Assessment & Plan Note (Addendum)
Reviewed age and gender appropriate health maintenance issues (prudent diet, regular exercise, health risks of tobacco and excessive alcohol, use of seatbelts, fire alarms in home, use of sunscreen).  Also reviewed age and gender appropriate health screening as well as vaccine recommendations. HP and PSA drawn today. DRE normal today. Colon cancer screening UTD. Flu vaccine IM today.  Borderline elevated bp today: Monitor bp outside of office (your drugstore) 3-5 times over the next 1-2 weeks. Call or return if average top number is >140 or average bottom number is > 90. Remember to rest for 10 min prior to checking your bp.  He brings up something about contact with a coworker "years ago" who had hepatitis---doesn't know what kind--says "they came in after that and gave everybody shots".  Also recalls being told after that at a blood bank that he could not donate blood.  He is not sure about f/u testing so I added hep panel to labs today.

## 2013-10-18 ENCOUNTER — Other Ambulatory Visit: Payer: Self-pay | Admitting: Family Medicine

## 2013-10-18 DIAGNOSIS — R972 Elevated prostate specific antigen [PSA]: Secondary | ICD-10-CM

## 2013-10-19 LAB — HEPATITIS B SURFACE ANTIGEN: Hepatitis B Surface Ag: NEGATIVE

## 2013-10-19 LAB — HEPATITIS B SURFACE ANTIBODY,QUALITATIVE: Hep B S Ab: POSITIVE — AB

## 2013-10-19 LAB — HEPATITIS C ANTIBODY: HCV Ab: NEGATIVE

## 2013-10-19 LAB — HEPATITIS A ANTIBODY, TOTAL: Hep A Total Ab: NONREACTIVE

## 2013-10-23 ENCOUNTER — Telehealth: Payer: Self-pay | Admitting: Family Medicine

## 2013-10-23 NOTE — Telephone Encounter (Signed)
Pls call pt and tell him the PSA elevation is very mild, but any elevation of this number COULD mean that he has prostate cancer.  However, tell him it is only a screening test--it does not necessarily mean he has prostate cancer. However, it means he needs to see an expert (urologist) to discuss a plan for further evaluation.  This may mean simply repeating the PSA test more often OR it could mean a biopsy of the prostate gland.  All I recommend at this point is that he go see the urologist and discuss things with him.--thx

## 2013-10-23 NOTE — Telephone Encounter (Signed)
Patient now calling back concerned with elevated PSA.  Patient would like to know what exactly that could mean and also what he should expect urology to do in order to find out why it is elevated.  Please advise.

## 2013-10-24 NOTE — Telephone Encounter (Signed)
Patient aware of results and recommendations of urology.

## 2014-09-18 ENCOUNTER — Encounter: Payer: Self-pay | Admitting: Family Medicine

## 2014-12-23 HISTORY — PX: PROSTATE BIOPSY: SHX241

## 2015-08-28 ENCOUNTER — Encounter: Payer: Self-pay | Admitting: Family Medicine

## 2015-08-30 ENCOUNTER — Ambulatory Visit (INDEPENDENT_AMBULATORY_CARE_PROVIDER_SITE_OTHER): Payer: BLUE CROSS/BLUE SHIELD | Admitting: Family Medicine

## 2015-08-30 ENCOUNTER — Encounter: Payer: Self-pay | Admitting: Family Medicine

## 2015-08-30 VITALS — BP 138/88 | HR 70 | Temp 98.2°F | Resp 16 | Ht 71.25 in | Wt 266.0 lb

## 2015-08-30 DIAGNOSIS — Z125 Encounter for screening for malignant neoplasm of prostate: Secondary | ICD-10-CM

## 2015-08-30 DIAGNOSIS — R400 Somnolence: Secondary | ICD-10-CM

## 2015-08-30 DIAGNOSIS — Z23 Encounter for immunization: Secondary | ICD-10-CM | POA: Diagnosis not present

## 2015-08-30 DIAGNOSIS — Z0001 Encounter for general adult medical examination with abnormal findings: Secondary | ICD-10-CM | POA: Diagnosis not present

## 2015-08-30 DIAGNOSIS — I10 Essential (primary) hypertension: Secondary | ICD-10-CM

## 2015-08-30 DIAGNOSIS — G4710 Hypersomnia, unspecified: Secondary | ICD-10-CM

## 2015-08-30 DIAGNOSIS — Z0000 Encounter for general adult medical examination without abnormal findings: Secondary | ICD-10-CM

## 2015-08-30 DIAGNOSIS — G4733 Obstructive sleep apnea (adult) (pediatric): Secondary | ICD-10-CM | POA: Diagnosis not present

## 2015-08-30 LAB — CBC WITH DIFFERENTIAL/PLATELET
Basophils Absolute: 0 10*3/uL (ref 0.0–0.1)
Basophils Relative: 0.4 % (ref 0.0–3.0)
Eosinophils Absolute: 0.2 10*3/uL (ref 0.0–0.7)
Eosinophils Relative: 2.1 % (ref 0.0–5.0)
HCT: 45.8 % (ref 39.0–52.0)
Hemoglobin: 15.2 g/dL (ref 13.0–17.0)
Lymphocytes Relative: 17.4 % (ref 12.0–46.0)
Lymphs Abs: 1.4 10*3/uL (ref 0.7–4.0)
MCHC: 33.3 g/dL (ref 30.0–36.0)
MCV: 87.9 fl (ref 78.0–100.0)
Monocytes Absolute: 0.6 10*3/uL (ref 0.1–1.0)
Monocytes Relative: 7 % (ref 3.0–12.0)
Neutro Abs: 6 10*3/uL (ref 1.4–7.7)
Neutrophils Relative %: 73.1 % (ref 43.0–77.0)
Platelets: 273 10*3/uL (ref 150.0–400.0)
RBC: 5.21 Mil/uL (ref 4.22–5.81)
RDW: 13.7 % (ref 11.5–15.5)
WBC: 8.2 10*3/uL (ref 4.0–10.5)

## 2015-08-30 LAB — COMPREHENSIVE METABOLIC PANEL
ALT: 31 U/L (ref 0–53)
AST: 22 U/L (ref 0–37)
Albumin: 4.4 g/dL (ref 3.5–5.2)
Alkaline Phosphatase: 62 U/L (ref 39–117)
BUN: 18 mg/dL (ref 6–23)
CO2: 28 mEq/L (ref 19–32)
Calcium: 9.4 mg/dL (ref 8.4–10.5)
Chloride: 108 mEq/L (ref 96–112)
Creatinine, Ser: 0.9 mg/dL (ref 0.40–1.50)
GFR: 92.2 mL/min (ref >60.00)
Glucose, Bld: 84 mg/dL (ref 70–99)
Potassium: 4.5 mEq/L (ref 3.5–5.1)
Sodium: 143 mEq/L (ref 135–145)
Total Bilirubin: 0.5 mg/dL (ref 0.2–1.2)
Total Protein: 6.6 g/dL (ref 6.0–8.3)

## 2015-08-30 LAB — LIPID PANEL
Cholesterol: 179 mg/dL (ref 0–200)
HDL: 51.5 mg/dL (ref >39.00)
LDL Cholesterol: 112 mg/dL — ABNORMAL HIGH (ref 0–99)
NonHDL: 127.79
Total CHOL/HDL Ratio: 3
Triglycerides: 80 mg/dL (ref 0.0–149.0)
VLDL: 16 mg/dL (ref 0.0–40.0)

## 2015-08-30 LAB — TSH: TSH: 2.8 u[IU]/mL (ref 0.35–4.50)

## 2015-08-30 NOTE — Progress Notes (Signed)
Pre visit review using our clinic review tool, if applicable. No additional management support is needed unless otherwise documented below in the visit note. 

## 2015-08-30 NOTE — Progress Notes (Signed)
Office Note 08/30/2015  CC:  Chief Complaint  Patient presents with  . Annual Exam    Pt is fasting.     HPI:  Carlos Ellis is a 60 y.o. White male who is here for annual health maintenance exam. Takes naproxen about every other day for knee pain.   BP at a [facility] since I last saw him and he recalls it being 160s/80s. He doesn't recall exactly when this measurement was. Not exercising, mainly due to chronic knee pain (got hyaluronic acid injections via his orthopedist and these did not help). Was dieting and lost 20lbs but then gained it all back. +Hx of snoring and excessive daytime somnolence, which he still endorses.  He wakes himself from sleep with a gasping breath occasionally.  Says he had sleep study in remote past (10 yrs or so ago) and was told it was normal.  He says he has gained 20-30 lbs since that time.  Regarding hx of elevated PSA and mildly abnl prostate bx, he was instructed by urology to f/u 6 mo after his biopsy, which he has not done but says he will.  He did have some hematospermia for 2 weeks after his bx and then it went away until the last couple weeks when he noted another episode of seeing blood in semen.   Past Medical History  Diagnosis Date  . Borderline hyperlipidemia   . Gout   . Acne     Dr. Katrinka Blazing (was on accutane at one point)  . Episcleritis of right eye     w/mild scleritis right eye ([facility] opht 2011); also with hx of ocular varicella at age 70.  . Hypogonadism male 2009  . Bronchitis 10/06/2011  . Osteoarthritis of left knee     Dr. Turner Daniels  . Depression     Zoloft 2005  . GERD (gastroesophageal reflux disease)   . Nephrolithiasis   . 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  Marland Kitchen BPH with obstruction/lower urinary tract symptoms 2015    Tamsulosin trial per urol 08/2014  . Elevated blood pressure reading without diagnosis of hypertension     Past  Surgical History  Procedure Laterality Date  . Knee arthroscopy      Left: 1990s and early 2000's.  . Rotator cuff repair      Right  . Lithotripsy      1990s  . Throat surgery      Infected branchial cleft cyst 2007  . Colonoscopy      X 2, normal (for strong FH of colon cancer.  Last in 2009 was normal.  Repeat 10 yrs ([facility]).    No family history on file.  Social History   Social History  . Marital Status: Married    Spouse Name: N/A  . Number of Children: N/A  . Years of Education: N/A   Occupational History  . Not on file.   Social History Main Topics  . Smoking status: Never Smoker   . Smokeless tobacco: Never Used  . Alcohol Use: 1.8 oz/week    3 Glasses of wine per week  . Drug Use: No  . Sexual Activity: Not on file   Other Topics Concern  . Not on file   Social History Narrative   Married, 2 teenage children.   Relocated to [place] from [place] 2007.   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   No tobacco.  Drinks 2-3 glasses of wine per week.  No drugs.   Walks his dog about 1 mile per day.    Outpatient Prescriptions Prior to Visit  Medication Sig Dispense Refill  . meloxicam (MOBIC) 7.5 MG tablet 1-2 tabs po qd prn knee pain (Patient not taking: Reported on 08/30/2015) 60 tablet 5   No facility-administered medications prior to visit.    Allergies  Allergen Reactions  . Sulfa Antibiotics     Can't remember reaction    ROS Review of Systems  Constitutional: Positive for fatigue. Negative for fever, chills and appetite change.  HENT: Negative for congestion, dental problem, ear pain and sore throat.   Eyes: Negative for discharge, redness and visual disturbance.  Respiratory: Negative for cough, chest tightness, shortness of breath and wheezing.   Cardiovascular: Negative for chest pain, palpitations and leg swelling.  Gastrointestinal: Negative for nausea, vomiting, abdominal pain,  diarrhea and blood in stool.  Genitourinary: Negative for dysuria, urgency, frequency, hematuria, flank pain and difficulty urinating.       Blood in semen recently--see HPI  Musculoskeletal: Negative for myalgias, back pain, joint swelling, arthralgias and neck stiffness.  Skin: Negative for pallor and rash.  Neurological: Negative for dizziness, speech difficulty, weakness and headaches.  Hematological: Negative for adenopathy. Does not bruise/bleed easily.  Psychiatric/Behavioral: Negative for confusion and sleep disturbance. The patient is not nervous/anxious.     PE; Blood pressure 150/86, pulse 70, temperature 98.2 F (36.8 C), temperature source Oral, resp. rate 16, height 5' 11.25" (1.81 m), weight 266 lb (120.657 kg), SpO2 93 %. BP recheck manual cuff 138/88 Gen: Alert, well appearing, obese-appearing WM in NAD.  Patient is oriented to person, place, time, and situation. AFFECT: pleasant, lucid thought and speech. ENT: Ears: EACs clear, normal epithelium.  TMs with good light reflex and landmarks bilaterally.  Eyes: no injection, icteris, swelling, or exudate.  EOMI, PERRLA. Nose: no drainage or turbinate edema/swelling.  No injection or focal lesion.  Mouth: lips without lesion/swelling.  Oral mucosa pink and moist.  Dentition intact and without obvious caries or gingival swelling.  Oropharynx without erythema, exudate, or swelling.  Neck: supple/nontender.  No LAD, mass, or TM.  Carotid pulses 2+ bilaterally, without bruits. CV: RRR, no m/r/g.   LUNGS: CTA bilat, nonlabored resps, good aeration in all lung fields. ABD: soft, NT, ND, BS normal.  No hepatospenomegaly or mass.  No bruits. EXT: no clubbing, cyanosis, or edema.  Musculoskeletal: no joint swelling, erythema, warmth, or tenderness.  ROM of all joints intact. Skin - no sores or suspicious lesions or rashes or color changes Rectal: deferred--he'll get this with urologist soon   Pertinent labs:  Lab Results  Component  Value Date   TSH 3.48 10/13/2013   Lab Results  Component Value Date   WBC 8.3 10/13/2013   HGB 15.5 10/13/2013   HCT 46.8 10/13/2013   MCV 90.3 10/13/2013   PLT 293.0 10/13/2013   Lab Results  Component Value Date   CREATININE 0.9 10/13/2013   BUN 12 10/13/2013   NA 139 10/13/2013   K 4.5 10/13/2013   CL 108 10/13/2013   CO2 24 10/13/2013   Lab Results  Component Value Date   ALT 36 10/13/2013   AST 27 10/13/2013   ALKPHOS 54 10/13/2013   BILITOT 0.6 10/13/2013   Lab Results  Component Value Date   CHOL 181 10/13/2013   Lab Results  Component Value Date  HDL 48.50 10/13/2013   Lab Results  Component Value Date   LDLCALC 113* 10/13/2013   Lab Results  Component Value Date   TRIG 100.0 10/13/2013   Lab Results  Component Value Date   CHOLHDL 4 10/13/2013   Lab Results  Component Value Date   PSA 3.65 10/13/2013    ASSESSMENT AND PLAN:   1) Essential HTN, stage I readings.  Repeat was wnl today. I will hold off on starting med until he gets re-eval for OSA. Instructions: Check bp at least once per week at a drug store or fire dept (rest 5 min prior to checking it). Write these numbers down and bring them to your follow up visit with me (within a month or so of getting your sleep study).  2) Snoring+ excessive daytime somnolence. Will refer to pulm for re-eval for possible OSA.  3) Hx of elevated PSA, bx with a couple of areas of high grade intraepithelial neoplasia (?significance?). Now with recent episode of hematospermia: encouraged pt strongly to arrange the f/u appt with Dr. Mena GoesEskridge that he was supposed to have made: he said he would do this.  4) Health maintenance exam: Reviewed age and gender appropriate health maintenance issues (prudent diet, regular exercise, health risks of tobacco and excessive alcohol, use of seatbelts, fire alarms in home, use of sunscreen).  Also reviewed age and gender appropriate health screening as well as vaccine  recommendations. Tdap and flu vaccines today. Colon ca screening UTD. Fasting HP labs drawn today.  An After Visit Summary was printed and given to the patient.  FOLLOW UP:  Return for pt to make f/u appt for HTN f/u 1 mo after sleep study--he'll call to arrange.

## 2015-08-30 NOTE — Patient Instructions (Signed)
Check bp at least once per week at a drug store or fire dept (rest 5 min prior to checking it). Write these numbers down and bring them to your follow up visit with me (within a month or so of getting your sleep study).  Arrange follow up visit with your urologist, Dr. Mena GoesEskridge.

## 2015-09-27 ENCOUNTER — Encounter: Payer: Self-pay | Admitting: Pulmonary Disease

## 2015-09-27 ENCOUNTER — Telehealth: Payer: Self-pay | Admitting: Pulmonary Disease

## 2015-09-27 ENCOUNTER — Ambulatory Visit (INDEPENDENT_AMBULATORY_CARE_PROVIDER_SITE_OTHER): Payer: BLUE CROSS/BLUE SHIELD | Admitting: Pulmonary Disease

## 2015-09-27 VITALS — BP 128/78 | HR 95 | Ht 71.5 in | Wt 264.8 lb

## 2015-09-27 DIAGNOSIS — G4710 Hypersomnia, unspecified: Secondary | ICD-10-CM | POA: Diagnosis not present

## 2015-09-27 DIAGNOSIS — R0683 Snoring: Secondary | ICD-10-CM

## 2015-09-27 DIAGNOSIS — E669 Obesity, unspecified: Secondary | ICD-10-CM

## 2015-09-27 NOTE — Assessment & Plan Note (Signed)
Patient has snoring, hypersomnia, choking, gasping during sleep.  ESS is -- 6.  Had a sleep study (inlab) in 2000 in [place] which was (-).  Does a lot of out of town driving.   We discussed about the diagnosis of Obstructive Sleep Apnea (OSA) and implications of untreated OSA. We discussed about CPAP and BiPaP as possible treatment options.   We will schedule the patient for a sleep study -- home sleep study. .    Patient was instructed to call the office if he/she has not heard back from the office 1-2 weeks after the sleep study.   Patient was instructed to call the office if he/she is having issues with the PAP device.   We discussed good sleep hygiene.   Patient was advised not to engage in activities requiring concentration and/or vigilance if he/she is sleepy.  Patient was advised not to drive if he/she is sleepy.   Plan: 1. Plan to schedule pt for a home sleep study first week of March. We will call him up then. He has dental issues now which need to be resolved prior to sleep study. 2. Pt wants to discuss results of sleep study before CPAP. Likely has mild-moderate OSA. He wants to avoid "things" on his face.

## 2015-09-27 NOTE — Progress Notes (Signed)
Subjective:    Patient ID: Carlos Ellis, male    DOB: 10-Mar-1956, 60 y.o.   MRN: [id number]  HPI  Patient is Carlos Ellis, 88M, referred by Dr. [name] for possible OSA. I am seeing patient in consultation for snoring, hypersomnia, possible OSA.  Patient states wife has noticed snoring 15 yrs ago. Severe enough that she needs to sleep before he does.  He had an inlab sleep study done in [place] in 2000 which was (-).  Pt continues to snore. Has hypersomnia, unrefreshed sleep in am.  He works at home -- he does not fall asleep at work but he struggles to stay awake in the afternoon.  He sometimes takes a noon nap if he did not get enough sleep at night. He sleeps at 11 pm, falls asleep right away then wakes up at 6 am. Sometimes, he awakens at 3am for no reason. Has choking, gasping. No witnessed apneas. Denies current abnormal behavior in sleep. He was acting out before but not recently.  Has not hurt himself or wife during sleep. Patient has gained 20-30 lbs since 2000.  He does frequent driving to out of state [place], [place]) as his son does hockey. Denies falling asleep during drive.     Review of Systems  Constitutional: Negative.  Negative for fever and unexpected weight change.  HENT: Positive for congestion. Negative for dental problem, ear pain, nosebleeds, postnasal drip, rhinorrhea, sinus pressure, sneezing, sore throat and trouble swallowing.   Eyes: Negative.  Negative for redness and itching.  Respiratory: Negative.  Negative for cough, chest tightness, shortness of breath and wheezing.   Cardiovascular: Negative.  Negative for palpitations and leg swelling.  Gastrointestinal: Negative.  Negative for nausea and vomiting.  Endocrine: Negative.   Genitourinary: Negative.  Negative for dysuria.  Musculoskeletal: Positive for joint swelling and arthralgias.  Skin: Negative.  Negative for rash.  Allergic/Immunologic: Negative.   Neurological: Negative.  Negative  for headaches.  Hematological: Negative.  Does not bruise/bleed easily.  Psychiatric/Behavioral: Negative.  Negative for dysphoric mood. The patient is not nervous/anxious.   All other systems reviewed and are negative.  Past Medical History  Diagnosis Date  . Borderline hyperlipidemia   . Gout   . Acne     Dr. Katrinka Blazing (was on accutane at one point)  . Episcleritis of right eye     w/mild scleritis right eye ([facility] opht 2011); also with hx of ocular varicella at age 8.  . Hypogonadism male 2009  . Bronchitis 10/06/2011  . Osteoarthritis of left knee     Dr. Turner Daniels  . Depression     Zoloft 2005  . GERD (gastroesophageal reflux disease)   . Nephrolithiasis   . 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  Marland Kitchen BPH with obstruction/lower urinary tract symptoms 2015    Tamsulosin trial per urol 08/2014  . Elevated blood pressure reading without diagnosis of hypertension      No family history on file.   Past Surgical History  Procedure Laterality Date  . Knee arthroscopy      Left: 1990s and early 2000's.  . Rotator cuff repair      Right  . Lithotripsy      1990s  . Throat surgery      Infected branchial cleft cyst 2007  . Colonoscopy      X 2, normal (for strong FH of colon  cancer.  Last in 2009 was normal.  Repeat 10 yrs ([facility]).    Social History   Social History  . Marital Status: Married    Spouse Name: N/A  . Number of Children: N/A  . Years of Education: N/A   Occupational History  . Not on file.   Social History Main Topics  . Smoking status: Never Smoker   . Smokeless tobacco: Never Used  . Alcohol Use: 1.8 oz/week    3 Glasses of wine per week  . Drug Use: No  . Sexual Activity: Not on file   Other Topics Concern  . Not on file   Social History Narrative   Married, 2 teenage children.   Relocated to  from [place] 2007.   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   No tobacco.  Drinks 2-3 glasses of wine per week.  No drugs.   Walks his dog about 1 mile per day.     Allergies  Allergen Reactions  . Sulfa Antibiotics     Can't remember reaction     Outpatient Prescriptions Prior to Visit  Medication Sig Dispense Refill  . naproxen (NAPROSYN) 500 MG tablet Take 1 tablet by mouth 2 (two) times daily after a meal.  6   No facility-administered medications prior to visit.   Meds ordered this encounter  Medications  . HYDROcodone-acetaminophen (NORCO/VICODIN) 5-325 MG tablet    Sig: Take 1 tablet by mouth every 6 (six) hours as needed.    Refill:  0          Objective:   Physical Exam  Constitutional/General:  Pleasant, well-nourished, well-developed, obese, not in any distress,  Comfortably seating.  Well kempt  HEENT: Pupils equal and reactive to light and accommodation. Anicteric sclerae. Normal nasal mucosa.   No oral  lesions,  mouth clear,  oropharynx clear, no postnasal drip. (-) Oral thrush. No dental caries.  Airway - Mallampati class III-IV  Neck: No masses. Midline trachea. No JVD, (-) LAD. (-) bruits appreciated.  Respiratory/Chest: Grossly normal chest. (-) deformity. (-) Accessory muscle use.  Symmetric expansion. (-) Tenderness on palpation.  Resonant on percussion.  Diminished BS on both lower lung zones. (-) wheezing, crackles, rhonchi (-) egophony  Cardiovascular: Regular rate and  rhythm, heart sounds normal, no murmur or gallops, no peripheral edema  Gastrointestinal:  Normal bowel sounds. Soft, non-tender. No hepatosplenomegaly.  (-) masses.   Musculoskeletal:  Normal muscle tone. Normal gait.   Extremities: Grossly normal. (-) clubbing, cyanosis.  (-) edema  Skin: (-) rash,lesions seen.   Neurological/Psychiatric : alert, oriented to time, place, person. Normal mood and affect            Assessment & Plan:  Snoring Patient  has snoring, hypersomnia, choking, gasping during sleep.  ESS is -- 6.  Had a sleep study (inlab) in 2000 in [place] which was (-).  Does a lot of out of town driving.   We discussed about the diagnosis of Obstructive Sleep Apnea (OSA) and implications of untreated OSA. We discussed about CPAP and BiPaP as possible treatment options.   We will schedule the patient for a sleep study -- home sleep study. .    Patient was instructed to call the office if he/she has not heard back from the office 1-2 weeks after the sleep study.   Patient was instructed to call the office if he/she is having issues with the PAP device.  We discussed good sleep hygiene.   Patient was advised not to engage in activities requiring concentration and/or vigilance if he/she is sleepy.  Patient was advised not to drive if he/she is sleepy.   Plan: 1. Plan to schedule pt for a home sleep study first week of March. We will call him up then. He has dental issues now which need to be resolved prior to sleep study. 2. Pt wants to discuss results of sleep study before CPAP. Likely has mild-moderate OSA. He wants to avoid "things" on his face.     Hypersomnia 62M with hypersomnia, snoring, fatigue. As per snoring A and P. Plan for a home sleep study.   Obesity Will need to discuss weight reduction on f/u. Encouraged pt to be more active. Plan for a stationary bike.  Treat OSA as well if ever.

## 2015-09-27 NOTE — Patient Instructions (Signed)
  We discussed about the diagnosis of Obstructive Sleep Apnea (OSA) and implications of untreated OSA.   We will schedule patient for a home sleep study. He has dental issues now and we want those resolved prior to sleep study. Plan for sleep study first week of march.    Patient was instructed to call the office if he/she has not heard back from the office 1-2 weeks after the sleep study.   We discussed good sleep hygiene.   Patient was advised not to engage in activities requiring concentration and/or vigilance if he/she is sleepy.  Patient was advised not to drive if he/she is sleepy.   Follow up in 6 weeks.

## 2015-09-27 NOTE — Assessment & Plan Note (Signed)
Will need to discuss weight reduction on f/u. Encouraged pt to be more active. Plan for a stationary bike.  Treat OSA as well if ever.

## 2015-09-27 NOTE — Assessment & Plan Note (Signed)
59M with hypersomnia, snoring, fatigue. As per snoring A and P. Plan for a home sleep study.

## 2015-09-27 NOTE — Telephone Encounter (Signed)
Will forward to [facility] to hold for when Dr. Christene Slates opens schedule.

## 2015-10-01 NOTE — Telephone Encounter (Signed)
Dr. Tommi Rumps Dios's schedule is still not available.

## 2015-10-02 NOTE — Telephone Encounter (Signed)
Pt is waiting a few more weeks to schedule HST due to dental procedure. Pt advised that we need to schedule f/u 2 weeks after HST and to please call us to schedule that once he knows date of HST.

## 2015-10-13 ENCOUNTER — Encounter: Payer: Self-pay | Admitting: Family Medicine

## 2015-10-29 ENCOUNTER — Telehealth: Payer: Self-pay | Admitting: Family Medicine

## 2015-10-29 NOTE — Telephone Encounter (Signed)
 Primary Care [facility] - Client TELEPHONE ADVICE RECORD [facility] Patient Name: Carlos Ellis DOB: 06/08/1956 Initial Comment Caller states he had a root canal in January- He was running a temp . Body aches. Lump inside his mouth. Facial swelling and facial pain. Chills. Fatigue. Nurse Assessment Nurse: Lane HackerHarley, RN, Elvin SoWindy Date/Time Lamount Cohen(Eastern Time): 10/29/2015 12:52:06 PM Confirm and document reason for call. If symptomatic, describe symptoms. You must click the next button to save text entered. ---Caller states he had a tender lump on bottom right inside of his mouth, with swelling right side of face then yesterday afternoon with body aches, and cold chills. Temp of 100.5 orally. Sweating a couple of times thru the night. 30 min. fever 100.7 orally. Fatigued. Denies tooth pain, however, just beyond where he had a root canal in January. Has the patient traveled out of the country within the last 30 days? ---Not Applicable Does the patient have any new or worsening symptoms? ---Yes Will a triage be completed? ---Yes Related visit to physician within the last 2 weeks? ---No Does the PT have any chronic conditions? (i.e. diabetes, asthma, etc.) ---Yes List chronic conditions. ---Enlarged prostate; Borderline HTN Is this a behavioral health or substance abuse call? ---No Guidelines Guideline Title Affirmed Question Affirmed Notes Face Swelling Fever Final Disposition User Go to ED Now (or PCP triage) Lane HackerHarley, RN, Elvin SoWindy Comments Appt with Felix Pacinienee Kuneff at office 3:15 pm today made Referrals GO TO FACILITY UNDECIDED Disagree/Comply: Comply

## 2015-10-30 ENCOUNTER — Ambulatory Visit: Payer: Self-pay | Admitting: Family Medicine

## 2015-11-25 ENCOUNTER — Encounter: Payer: Self-pay | Admitting: Family Medicine

## 2015-11-25 ENCOUNTER — Ambulatory Visit (INDEPENDENT_AMBULATORY_CARE_PROVIDER_SITE_OTHER): Payer: BLUE CROSS/BLUE SHIELD | Admitting: Family Medicine

## 2015-11-25 VITALS — BP 132/77 | HR 89 | Temp 98.5°F | Resp 16 | Ht 71.5 in | Wt 265.2 lb

## 2015-11-25 DIAGNOSIS — L309 Dermatitis, unspecified: Secondary | ICD-10-CM | POA: Diagnosis not present

## 2015-11-25 DIAGNOSIS — B3789 Other sites of candidiasis: Secondary | ICD-10-CM

## 2015-11-25 MED ORDER — NYSTATIN 100000 UNIT/GM EX CREA: TOPICAL_CREAM | Freq: Two times a day (BID) | CUTANEOUS | Status: DC

## 2015-11-25 NOTE — Progress Notes (Signed)
Pre visit review using our clinic review tool, if applicable. No additional management support is needed unless otherwise documented below in the visit note. 

## 2015-11-25 NOTE — Progress Notes (Signed)
OFFICE VISIT  11/25/2015   CC:  Chief Complaint  Patient presents with  . Rash    groin area x 1 month     HPI:    Patient is a 60 y.o. Caucasian male who presents for rash in groin area for the last month or so.  Reddish color, +spreading, trying lotrimin otc, gold bond powder, trying to keep it dry.  Located on both groin creases, now some on scrotum.  Sometimes it itches.  Says he had same thing before and I rx'd "some cream" and it helped.  Past Medical History  Diagnosis Date  . Borderline hyperlipidemia   . Gout   . Acne     Dr. Katrinka BlazingSmith (was on accutane at one point)  . Episcleritis of right eye     w/mild scleritis right eye ([facility] opht 2011); also with hx of ocular varicella at age 60.  . Hypogonadism male 2009  . Bronchitis 10/06/2011  . Osteoarthritis of left knee     Dr. Turner Danielsowan  . Depression     Zoloft 2005  . GERD (gastroesophageal reflux disease)   . Nephrolithiasis   . 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  Urol following PSA closely.  Marland Kitchen. BPH with obstruction/lower urinary tract symptoms 2015    finasteride trial 09/2015  . Elevated blood pressure reading without diagnosis of hypertension     Past Surgical History  Procedure Laterality Date  . Knee arthroscopy      Left: 1990s and early 2000's.  . Rotator cuff repair      Right  . Lithotripsy      1990s  . Throat surgery      Infected branchial cleft cyst 2007  . Colonoscopy      X 2, normal (for strong FH of colon cancer.  Last in 2009 was normal.  Repeat 10 yrs ([facility]).    Outpatient Prescriptions Prior to Visit  Medication Sig Dispense Refill  . naproxen (NAPROSYN) 500 MG tablet Take 1 tablet by mouth 2 (two) times daily after a meal.  6  . HYDROcodone-acetaminophen (NORCO/VICODIN) 5-325 MG tablet Take 1 tablet by mouth every 6 (six) hours as needed. Reported on 11/25/2015  0   No facility-administered  medications prior to visit.    Allergies  Allergen Reactions  . Sulfa Antibiotics     Can't remember reaction    ROS As per HPI  PE: Blood pressure 132/77, pulse 89, temperature 98.5 F (36.9 C), temperature source Oral, resp. rate 16, height 5' 11.5" (1.816 m), weight 265 lb 4 oz (120.317 kg), SpO2 97 %. Gen: Alert, well appearing.  Patient is oriented to person, place, time, and situation. SKIN: groin creases with extensive erythematous macular rash with well demarcated borders and satellite lesions. NO macerated areas of skin.  LABS:  none  IMPRESSION AND PLAN:  1) Candida rash of groin: nystatin cream bid rx'd today. Keep area dry.  2) Elev bp w/out dx of HTN: reassuring/normal bp's at some recent specialists visits, plus normal here today.  An After Visit Summary was printed and given to the patient.  FOLLOW UP: Return if symptoms worsen or fail to improve.  Signed:  Santiago BumpersPhil Janesia Joswick, MD           11/25/2015

## 2016-01-01 DIAGNOSIS — M25562 Pain in left knee: Secondary | ICD-10-CM | POA: Diagnosis not present

## 2016-01-02 DIAGNOSIS — R972 Elevated prostate specific antigen [PSA]: Secondary | ICD-10-CM | POA: Diagnosis not present

## 2016-01-13 DIAGNOSIS — R972 Elevated prostate specific antigen [PSA]: Secondary | ICD-10-CM | POA: Diagnosis not present

## 2016-01-13 DIAGNOSIS — R350 Frequency of micturition: Secondary | ICD-10-CM | POA: Diagnosis not present

## 2016-01-13 DIAGNOSIS — Z0000 Encounter for general adult medical examination without abnormal findings: Secondary | ICD-10-CM | POA: Diagnosis not present

## 2016-01-13 DIAGNOSIS — N401 Enlarged prostate with lower urinary tract symptoms: Secondary | ICD-10-CM | POA: Diagnosis not present

## 2016-02-10 ENCOUNTER — Other Ambulatory Visit: Payer: Self-pay | Admitting: Orthopaedic Surgery

## 2016-02-28 ENCOUNTER — Ambulatory Visit (HOSPITAL_COMMUNITY)
Admission: RE | Admit: 2016-02-28 | Discharge: 2016-02-28 | Disposition: A | Discharge status: Home / Self Care | Payer: BLUE CROSS/BLUE SHIELD | Source: Ambulatory Visit | Attending: Orthopaedic Surgery | Admitting: Orthopaedic Surgery

## 2016-02-28 ENCOUNTER — Encounter (HOSPITAL_COMMUNITY)
Admission: RE | Admit: 2016-02-28 | Discharge: 2016-02-28 | Disposition: A | Discharge status: Home / Self Care | Payer: BLUE CROSS/BLUE SHIELD | Source: Ambulatory Visit | Attending: Orthopaedic Surgery | Admitting: Orthopaedic Surgery

## 2016-02-28 ENCOUNTER — Other Ambulatory Visit (HOSPITAL_COMMUNITY): Payer: Self-pay | Admitting: *Deleted

## 2016-02-28 ENCOUNTER — Encounter (HOSPITAL_COMMUNITY): Payer: Self-pay

## 2016-02-28 DIAGNOSIS — Z0183 Encounter for blood typing: Secondary | ICD-10-CM | POA: Insufficient documentation

## 2016-02-28 DIAGNOSIS — M1712 Unilateral primary osteoarthritis, left knee: Secondary | ICD-10-CM | POA: Diagnosis not present

## 2016-02-28 DIAGNOSIS — Z01818 Encounter for other preprocedural examination: Secondary | ICD-10-CM

## 2016-02-28 DIAGNOSIS — Z01812 Encounter for preprocedural laboratory examination: Secondary | ICD-10-CM | POA: Diagnosis not present

## 2016-02-28 LAB — BASIC METABOLIC PANEL
Anion gap: 8 (ref 5–15)
BUN: 14 mg/dL (ref 6–20)
CO2: 24 mmol/L (ref 22–32)
Calcium: 9.3 mg/dL (ref 8.9–10.3)
Chloride: 106 mmol/L (ref 101–111)
Creatinine, Ser: 0.9 mg/dL (ref 0.61–1.24)
GFR calc Af Amer: >60 mL/min (ref >60)
GFR calc non Af Amer: >60 mL/min (ref >60)
Glucose, Bld: 129 mg/dL — ABNORMAL HIGH (ref 65–99)
Potassium: 3.7 mmol/L (ref 3.5–5.1)
Sodium: 138 mmol/L (ref 135–145)

## 2016-02-28 LAB — PROTIME-INR
INR: 1.01 (ref 0.00–1.49)
Prothrombin Time: 13.5 seconds (ref 11.6–15.2)

## 2016-02-28 LAB — CBC WITH DIFFERENTIAL/PLATELET
Basophils Absolute: 0 10*3/uL (ref 0.0–0.1)
Basophils Relative: 0 %
Eosinophils Absolute: 0.2 10*3/uL (ref 0.0–0.7)
Eosinophils Relative: 2 %
HCT: 43.6 % (ref 39.0–52.0)
Hemoglobin: 14.9 g/dL (ref 13.0–17.0)
Lymphocytes Relative: 21 %
Lymphs Abs: 1.8 10*3/uL (ref 0.7–4.0)
MCH: 29.7 pg (ref 26.0–34.0)
MCHC: 34.2 g/dL (ref 30.0–36.0)
MCV: 86.9 fL (ref 78.0–100.0)
Monocytes Absolute: 0.4 10*3/uL (ref 0.1–1.0)
Monocytes Relative: 5 %
Neutro Abs: 6.1 10*3/uL (ref 1.7–7.7)
Neutrophils Relative %: 72 %
Platelets: 256 10*3/uL (ref 150–400)
RBC: 5.02 MIL/uL (ref 4.22–5.81)
RDW: 13.2 % (ref 11.5–15.5)
WBC: 8.5 10*3/uL (ref 4.0–10.5)

## 2016-02-28 LAB — URINALYSIS, ROUTINE W REFLEX MICROSCOPIC
Bilirubin Urine: NEGATIVE
Glucose, UA: NEGATIVE mg/dL
Hgb urine dipstick: NEGATIVE
Ketones, ur: NEGATIVE mg/dL
Leukocytes, UA: NEGATIVE
Nitrite: NEGATIVE
Protein, ur: NEGATIVE mg/dL
Specific Gravity, Urine: 1.026 (ref 1.005–1.030)
pH: 5.5 (ref 5.0–8.0)

## 2016-02-28 LAB — SURGICAL PCR SCREEN
MRSA, PCR: NEGATIVE
Staphylococcus aureus: NEGATIVE

## 2016-02-28 LAB — TYPE AND SCREEN
ABO/RH(D): B NEG
Antibody Screen: NEGATIVE

## 2016-02-28 LAB — APTT: aPTT: 31 seconds (ref 24–37)

## 2016-02-28 LAB — ABO/RH: ABO/RH(D): B NEG

## 2016-02-28 NOTE — Pre-Procedure Instructions (Signed)
    Margo CommonStephen Tribbey  02/28/2016      [id number] - [place] [place] - [address] [place] [id number] Phone: [phone] Fax: [phone]    Your procedure is scheduled on 03-10-2016  Tuesday    [facility] Admitting at 8:20 A.M.   Call this number if you have problems the morning of surgery:  [phone]   Remember:  Do not eat food or drink liquids after midnight.   Take these medicines the morning of surgery with A SIP OF WATER finasteride(Proscar),Hydrocodone If needed  STOP ASPIRIN,ANTIINFLAMATORIES (IBUPROFEN,ALEVE,MOTRIN,ADVIL,GOODY'S POWDERS),HERBAL SUPPLEMENTS,FISH OIL,AND VITAMINS 5-7 DAYS PRIOR TO SURGERY   Do not wear jewelry, make-up or nail polish.  Do not wear lotions, powders, or perfumes.  You may not wear deoderant.  Do not shave 48 hours prior to surgery.  Men may shave face and neck.   Do not bring valuables to the hospital.  [facility] is not responsible for any belongings or valuables.  Contacts, dentures or bridgework may not be worn into surgery.  Leave your suitcase in the car.  After surgery it may be brought to your room.  For patients admitted to the hospital, discharge time will be determined by your treatment team.  Patients discharged the day of surgery will not be allowed to drive home.    Special instructions:  See attached Sheet for instructions on CHG showers  Please read over the following fact sheets that you were given. MRSA Information and Surgical Site Infection Prevention

## 2016-03-04 NOTE — H&P (Signed)
TOTAL KNEE ADMISSION H&P  Patient is being admitted for left total knee arthroplasty.  Subjective:  Chief Complaint:left knee pain.  HPI: Carlos Ellis, 60 y.o. male, has a history of pain and functional disability in the left knee due to arthritis and has failed non-surgical conservative treatments for greater than 12 weeks to includeNSAID's and/or analgesics, corticosteriod injections, viscosupplementation injections, flexibility and strengthening excercises, supervised PT with diminished ADL's post treatment, use of assistive devices, weight reduction as appropriate and activity modification.  Onset of symptoms was gradual, starting 5 years ago with gradually worsening course since that time. The patient noted prior procedures on the knee to include  arthroscopy on the left knee(s).  Patient currently rates pain in the left knee(s) at 10 out of 10 with activity. Patient has night pain, worsening of pain with activity and weight bearing, pain that interferes with activities of daily living, crepitus and joint swelling.  Patient has evidence of subchondral cysts, subchondral sclerosis, periarticular osteophytes and joint space narrowing by imaging studies.  There is no active infection.  Patient Active Problem List   Diagnosis Date Noted  . Snoring 09/27/2015  . Hypersomnia 09/27/2015  . Obesity 09/27/2015  . Health maintenance examination 02/14/2012  . Osteoarthritis   . Depression   . GERD (gastroesophageal reflux disease)   . Nephrolithiasis   . Episcleritis of right eye   . Gout   . Acne   . Bronchitis 10/06/2011  . Hypogonadism, male 05/18/2011  . Borderline hyperlipidemia    Past Medical History  Diagnosis Date  . Borderline hyperlipidemia   . Gout   . Acne     Dr. Katrinka BlazingSmith (was on accutane at one point)  . Episcleritis of right eye     w/mild scleritis right eye ([facility] opht 2011); also with hx of ocular varicella at age 60.  . Hypogonadism male 2009  . Bronchitis 10/06/2011   . Osteoarthritis of left knee     Dr. Turner Danielsowan  . Depression     Zoloft 2005  . Nephrolithiasis   . 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  Urol following PSA closely.  Marland Kitchen. BPH with obstruction/lower urinary tract symptoms 2015    finasteride trial 09/2015  . Elevated blood pressure reading without diagnosis of hypertension   . GERD (gastroesophageal reflux disease)     Tums    Past Surgical History  Procedure Laterality Date  . Knee arthroscopy      Left: 1990s and early 2000's.  . Rotator cuff repair      Right  . Lithotripsy      1990s  . Throat surgery      Infected branchial cleft cyst 2007  . Colonoscopy      X 2, normal (for strong FH of colon cancer.  Last in 2009 was normal.  Repeat 10 yrs ([facility]).  . Fracture surgery Left     No prescriptions prior to admission   Allergies  Allergen Reactions  . Sulfa Antibiotics Rash    Social History  Substance Use Topics  . Smoking status: Never Smoker   . Smokeless tobacco: Never Used  . Alcohol Use: 1.8 oz/week    3 Glasses of wine per week    No family history on file.   Review of Systems  Musculoskeletal: Positive for joint pain.       Left knee  All other systems reviewed and  are negative.   Objective:  Physical Exam  Constitutional: He is oriented to person, place, and time. He appears well-developed and well-nourished.  HENT:  Head: Normocephalic and atraumatic.  Eyes: Pupils are equal, round, and reactive to light.  Neck: Normal range of motion.  Cardiovascular: Normal rate and regular rhythm.   Respiratory: Effort normal.  GI: Soft.  Musculoskeletal:  Left knee has healed arthroscopic portals. There is no effusion. His motion is from 0-120. He has medial joint line pain and crepitation. Hip motion is full.   Neurological: He is alert and oriented to person, place, and time.  Skin: Skin is warm and dry.   Psychiatric: He has a normal mood and affect. His behavior is normal. Judgment and thought content normal.    Vital signs in last 24 hours:    Labs:   Estimated body mass index is 36.48 kg/(m^2) as calculated from the following:   Height as of 11/25/15: 5' 11.5" (1.816 m).   Weight as of 11/25/15: 120.317 kg (265 lb 4 oz).   Imaging Review Plain radiographs demonstrate severe degenerative joint disease of the left knee(s). The overall alignment isneutral. The bone quality appears to be good for age and reported activity level.  Assessment/Plan:  End stage  Primary arthritis, left knee   The patient history, physical examination, clinical judgment of the provider and imaging studies are consistent with end stage degenerative joint disease of the left knee(s) and total knee arthroplasty is deemed medically necessary. The treatment options including medical management, injection therapy arthroscopy and arthroplasty were discussed at length. The risks and benefits of total knee arthroplasty were presented and reviewed. The risks due to aseptic loosening, infection, stiffness, patella tracking problems, thromboembolic complications and other imponderables were discussed. The patient acknowledged the explanation, agreed to proceed with the plan and consent was signed. Patient is being admitted for inpatient treatment for surgery, pain control, PT, OT, prophylactic antibiotics, VTE prophylaxis, progressive ambulation and ADL's and discharge planning. The patient is planning to be discharged home with home health services

## 2016-03-09 MED ORDER — DEXTROSE 5 % IV SOLN
3.0000 g | INTRAVENOUS | Status: AC
  Administered 2016-03-10: 3 g via INTRAVENOUS
  Filled 2016-03-09: qty 3000

## 2016-03-10 ENCOUNTER — Encounter (HOSPITAL_COMMUNITY): Payer: Self-pay | Admitting: Anesthesiology

## 2016-03-10 ENCOUNTER — Inpatient Hospital Stay (HOSPITAL_COMMUNITY)
Admission: RE | Admit: 2016-03-10 | Discharge: 2016-03-13 | DRG: 470 | Disposition: A | Discharge status: Home Health | Payer: BLUE CROSS/BLUE SHIELD | Source: Ambulatory Visit | Attending: Orthopaedic Surgery | Admitting: Orthopaedic Surgery

## 2016-03-10 ENCOUNTER — Inpatient Hospital Stay (HOSPITAL_COMMUNITY): Payer: BLUE CROSS/BLUE SHIELD | Admitting: Anesthesiology

## 2016-03-10 ENCOUNTER — Encounter (HOSPITAL_COMMUNITY)
Admission: RE | Disposition: A | Discharge status: Home Health | Payer: Self-pay | Source: Ambulatory Visit | Attending: Orthopaedic Surgery

## 2016-03-10 DIAGNOSIS — M109 Gout, unspecified: Secondary | ICD-10-CM | POA: Diagnosis not present

## 2016-03-10 DIAGNOSIS — M1712 Unilateral primary osteoarthritis, left knee: Principal | ICD-10-CM | POA: Diagnosis present

## 2016-03-10 DIAGNOSIS — K219 Gastro-esophageal reflux disease without esophagitis: Secondary | ICD-10-CM | POA: Diagnosis not present

## 2016-03-10 DIAGNOSIS — Z7982 Long term (current) use of aspirin: Secondary | ICD-10-CM | POA: Diagnosis not present

## 2016-03-10 DIAGNOSIS — M179 Osteoarthritis of knee, unspecified: Secondary | ICD-10-CM | POA: Diagnosis not present

## 2016-03-10 DIAGNOSIS — E669 Obesity, unspecified: Secondary | ICD-10-CM | POA: Diagnosis not present

## 2016-03-10 DIAGNOSIS — Z96652 Presence of left artificial knee joint: Secondary | ICD-10-CM | POA: Diagnosis not present

## 2016-03-10 DIAGNOSIS — Z6837 Body mass index (BMI) 37.0-37.9, adult: Secondary | ICD-10-CM

## 2016-03-10 DIAGNOSIS — Z6836 Body mass index (BMI) 36.0-36.9, adult: Secondary | ICD-10-CM | POA: Diagnosis not present

## 2016-03-10 DIAGNOSIS — M25562 Pain in left knee: Secondary | ICD-10-CM | POA: Diagnosis not present

## 2016-03-10 HISTORY — PX: TOTAL KNEE ARTHROPLASTY: SHX125

## 2016-03-10 SURGERY — ARTHROPLASTY, KNEE, TOTAL
Anesthesia: Spinal | Laterality: Left

## 2016-03-10 MED ORDER — PROPOFOL 10 MG/ML IV BOLUS
INTRAVENOUS | Status: DC | PRN
  Administered 2016-03-10: 10 mg via INTRAVENOUS
  Administered 2016-03-10: 20 mg via INTRAVENOUS

## 2016-03-10 MED ORDER — TRANEXAMIC ACID 1000 MG/10ML IV SOLN
2000.0000 mg | INTRAVENOUS | Status: DC | PRN
  Administered 2016-03-10: 2000 mg via TOPICAL

## 2016-03-10 MED ORDER — METOCLOPRAMIDE HCL 5 MG/ML IJ SOLN: 5.0000 mg | Freq: Three times a day (TID) | INTRAMUSCULAR | Status: DC | PRN

## 2016-03-10 MED ORDER — HYDROMORPHONE HCL 1 MG/ML IJ SOLN: 0.2500 mg | INTRAMUSCULAR | Status: DC | PRN

## 2016-03-10 MED ORDER — LACTATED RINGERS IV SOLN
INTRAVENOUS | Status: DC
  Administered 2016-03-10 – 2016-03-11 (×2): via INTRAVENOUS

## 2016-03-10 MED ORDER — BUPIVACAINE IN DEXTROSE 0.75-8.25 % IT SOLN
INTRATHECAL | Status: DC | PRN
  Administered 2016-03-10: 2 mL via INTRATHECAL

## 2016-03-10 MED ORDER — DOCUSATE SODIUM 100 MG PO CAPS
100.0000 mg | ORAL_CAPSULE | Freq: Two times a day (BID) | ORAL | Status: DC
  Administered 2016-03-10 – 2016-03-13 (×6): 100 mg via ORAL
  Filled 2016-03-10 (×6): qty 1

## 2016-03-10 MED ORDER — PHENYLEPHRINE HCL 10 MG/ML IJ SOLN
INTRAMUSCULAR | Status: DC | PRN
  Administered 2016-03-10: 120 ug via INTRAVENOUS
  Administered 2016-03-10: 80 ug via INTRAVENOUS
  Administered 2016-03-10: 120 ug via INTRAVENOUS
  Administered 2016-03-10 (×2): 80 ug via INTRAVENOUS

## 2016-03-10 MED ORDER — DIPHENHYDRAMINE HCL 12.5 MG/5ML PO ELIX
12.5000 mg | ORAL_SOLUTION | ORAL | Status: DC | PRN
  Administered 2016-03-12 – 2016-03-13 (×3): 12.5 mg via ORAL
  Filled 2016-03-10 (×3): qty 10

## 2016-03-10 MED ORDER — PHENOL 1.4 % MT LIQD
1.0000 | OROMUCOSAL | Status: DC | PRN
Start: 2016-03-10 — End: 2016-03-13

## 2016-03-10 MED ORDER — ONDANSETRON HCL 4 MG/2ML IJ SOLN: 4.0000 mg | Freq: Once | INTRAMUSCULAR | Status: DC | PRN

## 2016-03-10 MED ORDER — TRANEXAMIC ACID 1000 MG/10ML IV SOLN
2000.0000 mg | INTRAVENOUS | Status: DC
  Filled 2016-03-10: qty 20

## 2016-03-10 MED ORDER — SODIUM CHLORIDE 0.9 % IR SOLN
Status: DC | PRN
  Administered 2016-03-10: 3000 mL

## 2016-03-10 MED ORDER — METHOCARBAMOL 500 MG PO TABS
500.0000 mg | ORAL_TABLET | Freq: Four times a day (QID) | ORAL | Status: DC | PRN
  Administered 2016-03-10 – 2016-03-13 (×11): 500 mg via ORAL
  Filled 2016-03-10 (×11): qty 1

## 2016-03-10 MED ORDER — BUPIVACAINE-EPINEPHRINE (PF) 0.5% -1:200000 IJ SOLN
INTRAMUSCULAR | Status: AC
  Filled 2016-03-10: qty 30

## 2016-03-10 MED ORDER — PROPOFOL 500 MG/50ML IV EMUL
INTRAVENOUS | Status: DC | PRN
  Administered 2016-03-10: 75 ug/kg/min via INTRAVENOUS

## 2016-03-10 MED ORDER — ACETAMINOPHEN 650 MG RE SUPP: 650.0000 mg | Freq: Four times a day (QID) | RECTAL | Status: DC | PRN

## 2016-03-10 MED ORDER — LACTATED RINGERS IV SOLN
INTRAVENOUS | Status: DC
  Administered 2016-03-10 (×2): via INTRAVENOUS

## 2016-03-10 MED ORDER — MIDAZOLAM HCL 5 MG/5ML IJ SOLN
INTRAMUSCULAR | Status: DC | PRN
  Administered 2016-03-10: 2 mg via INTRAVENOUS

## 2016-03-10 MED ORDER — CHLORHEXIDINE GLUCONATE 4 % EX LIQD: 60.0000 mL | Freq: Once | CUTANEOUS | Status: DC

## 2016-03-10 MED ORDER — MEPERIDINE HCL 25 MG/ML IJ SOLN: 6.2500 mg | INTRAMUSCULAR | Status: DC | PRN

## 2016-03-10 MED ORDER — SODIUM CHLORIDE 0.9 % IJ SOLN
INTRAMUSCULAR | Status: DC | PRN
  Administered 2016-03-10 (×4): 10 mL

## 2016-03-10 MED ORDER — HYDROCODONE-ACETAMINOPHEN 5-325 MG PO TABS
1.0000 | ORAL_TABLET | ORAL | Status: DC | PRN
  Administered 2016-03-10 – 2016-03-13 (×17): 2 via ORAL
  Filled 2016-03-10 (×18): qty 2

## 2016-03-10 MED ORDER — METOCLOPRAMIDE HCL 5 MG PO TABS: 5.0000 mg | ORAL_TABLET | Freq: Three times a day (TID) | ORAL | Status: DC | PRN

## 2016-03-10 MED ORDER — BISACODYL 5 MG PO TBEC: 5.0000 mg | DELAYED_RELEASE_TABLET | Freq: Every day | ORAL | Status: DC | PRN

## 2016-03-10 MED ORDER — HYDROMORPHONE HCL 1 MG/ML IJ SOLN
0.5000 mg | INTRAMUSCULAR | Status: DC | PRN
  Administered 2016-03-10 – 2016-03-11 (×7): 1 mg via INTRAVENOUS
  Filled 2016-03-10 (×7): qty 1

## 2016-03-10 MED ORDER — ASPIRIN EC 325 MG PO TBEC
325.0000 mg | DELAYED_RELEASE_TABLET | Freq: Two times a day (BID) | ORAL | Status: DC
  Administered 2016-03-11 – 2016-03-13 (×4): 325 mg via ORAL
  Filled 2016-03-10 (×5): qty 1

## 2016-03-10 MED ORDER — ACETAMINOPHEN 325 MG PO TABS
650.0000 mg | ORAL_TABLET | Freq: Four times a day (QID) | ORAL | Status: DC | PRN
  Administered 2016-03-11: 650 mg via ORAL
  Filled 2016-03-10: qty 2

## 2016-03-10 MED ORDER — MENTHOL 3 MG MT LOZG: 1.0000 | LOZENGE | OROMUCOSAL | Status: DC | PRN

## 2016-03-10 MED ORDER — ONDANSETRON HCL 4 MG/2ML IJ SOLN: 4.0000 mg | Freq: Four times a day (QID) | INTRAMUSCULAR | Status: DC | PRN

## 2016-03-10 MED ORDER — ONDANSETRON HCL 4 MG PO TABS: 4.0000 mg | ORAL_TABLET | Freq: Four times a day (QID) | ORAL | Status: DC | PRN

## 2016-03-10 MED ORDER — BUPIVACAINE LIPOSOME 1.3 % IJ SUSP
20.0000 mL | INTRAMUSCULAR | Status: DC
  Filled 2016-03-10: qty 20

## 2016-03-10 MED ORDER — FINASTERIDE 5 MG PO TABS
5.0000 mg | ORAL_TABLET | Freq: Every day | ORAL | Status: DC
  Administered 2016-03-11 – 2016-03-13 (×3): 5 mg via ORAL
  Filled 2016-03-10 (×3): qty 1

## 2016-03-10 MED ORDER — BUPIVACAINE LIPOSOME 1.3 % IJ SUSP
INTRAMUSCULAR | Status: DC | PRN
  Administered 2016-03-10: 11:00:00

## 2016-03-10 MED ORDER — FENTANYL CITRATE (PF) 250 MCG/5ML IJ SOLN
INTRAMUSCULAR | Status: AC
  Filled 2016-03-10: qty 5

## 2016-03-10 MED ORDER — MIDAZOLAM HCL 2 MG/2ML IJ SOLN
INTRAMUSCULAR | Status: AC
  Filled 2016-03-10: qty 2

## 2016-03-10 MED ORDER — METHOCARBAMOL 1000 MG/10ML IJ SOLN
500.0000 mg | Freq: Four times a day (QID) | INTRAVENOUS | Status: DC | PRN
  Filled 2016-03-10: qty 5

## 2016-03-10 MED ORDER — 0.9 % SODIUM CHLORIDE (POUR BTL) OPTIME
TOPICAL | Status: DC | PRN
  Administered 2016-03-10: 1000 mL

## 2016-03-10 MED ORDER — ALUM & MAG HYDROXIDE-SIMETH 200-200-20 MG/5ML PO SUSP: 30.0000 mL | ORAL | Status: DC | PRN

## 2016-03-10 MED ORDER — CEFAZOLIN SODIUM-DEXTROSE 2-4 GM/100ML-% IV SOLN
2.0000 g | Freq: Four times a day (QID) | INTRAVENOUS | Status: AC
  Administered 2016-03-10 (×2): 2 g via INTRAVENOUS
  Filled 2016-03-10 (×2): qty 100

## 2016-03-10 MED ORDER — BUPIVACAINE-EPINEPHRINE (PF) 0.5% -1:200000 IJ SOLN
INTRAMUSCULAR | Status: DC | PRN
  Administered 2016-03-10: 20 mL via PERINEURAL

## 2016-03-10 MED ORDER — FENTANYL CITRATE (PF) 100 MCG/2ML IJ SOLN
INTRAMUSCULAR | Status: DC | PRN
  Administered 2016-03-10: 100 ug via INTRAVENOUS

## 2016-03-10 MED ORDER — FENTANYL CITRATE (PF) 100 MCG/2ML IJ SOLN
INTRAMUSCULAR | Status: AC
  Filled 2016-03-10: qty 2

## 2016-03-10 MED ORDER — TRANEXAMIC ACID 1000 MG/10ML IV SOLN
1000.0000 mg | INTRAVENOUS | Status: AC
  Administered 2016-03-10: 1000 mg via INTRAVENOUS
  Filled 2016-03-10: qty 10

## 2016-03-10 SURGICAL SUPPLY — 66 items
BAG DECANTER FOR FLEXI CONT (MISCELLANEOUS) ×1 IMPLANT
BANDAGE ELASTIC 4 VELCRO ST LF (GAUZE/BANDAGES/DRESSINGS) ×2 IMPLANT
BANDAGE ELASTIC 6 VELCRO ST LF (GAUZE/BANDAGES/DRESSINGS) ×1 IMPLANT
BANDAGE ESMARK 6X9 LF (GAUZE/BANDAGES/DRESSINGS) ×1 IMPLANT
BLADE SAGITTAL 25.0X1.19X90 (BLADE) IMPLANT
BLADE SAW SGTL 13.0X1.19X90.0M (BLADE) IMPLANT
BLADE SURG ROTATE 9660 (MISCELLANEOUS) IMPLANT
BNDG CMPR 9X6 STRL LF SNTH (GAUZE/BANDAGES/DRESSINGS) ×1
BNDG CMPR MED 10X6 ELC LF (GAUZE/BANDAGES/DRESSINGS) ×1
BNDG ELASTIC 6X10 VLCR STRL LF (GAUZE/BANDAGES/DRESSINGS) ×2 IMPLANT
BNDG ESMARK 6X9 LF (GAUZE/BANDAGES/DRESSINGS) ×2
BNDG GAUZE ELAST 4 BULKY (GAUZE/BANDAGES/DRESSINGS) ×3 IMPLANT
BOWL SMART MIX CTS (DISPOSABLE) ×2 IMPLANT
CAP KNEE TOTAL 3 SIGMA ×1 IMPLANT
CEMENT HV SMART SET (Cement) ×4 IMPLANT
CLSR STERI-STRIP ANTIMIC 1/2X4 (GAUZE/BANDAGES/DRESSINGS) ×1 IMPLANT
COVER SURGICAL LIGHT HANDLE (MISCELLANEOUS) ×2 IMPLANT
CUFF TOURNIQUET SINGLE 34IN LL (TOURNIQUET CUFF) ×2 IMPLANT
CUFF TOURNIQUET SINGLE 44IN (TOURNIQUET CUFF) IMPLANT
DECANTER SPIKE VIAL GLASS SM (MISCELLANEOUS) ×1 IMPLANT
DRAPE EXTREMITY T 121X128X90 (DRAPE) ×2 IMPLANT
DRAPE PROXIMA HALF (DRAPES) ×2 IMPLANT
DRAPE U-SHAPE 47X51 STRL (DRAPES) ×2 IMPLANT
DRSG ADAPTIC 3X8 NADH LF (GAUZE/BANDAGES/DRESSINGS) ×2 IMPLANT
DRSG PAD ABDOMINAL 8X10 ST (GAUZE/BANDAGES/DRESSINGS) ×2 IMPLANT
DURAPREP 26ML APPLICATOR (WOUND CARE) ×2 IMPLANT
ELECT REM PT RETURN 9FT ADLT (ELECTROSURGICAL) ×2
ELECTRODE REM PT RTRN 9FT ADLT (ELECTROSURGICAL) ×1 IMPLANT
GAUZE SPONGE 4X4 12PLY STRL (GAUZE/BANDAGES/DRESSINGS) ×2 IMPLANT
GLOVE BIO SURGEON STRL SZ8 (GLOVE) ×4 IMPLANT
GLOVE BIOGEL PI IND STRL 8 (GLOVE) ×2 IMPLANT
GLOVE BIOGEL PI INDICATOR 8 (GLOVE) ×2
GOWN STRL REUS W/ TWL LRG LVL3 (GOWN DISPOSABLE) ×1 IMPLANT
GOWN STRL REUS W/ TWL XL LVL3 (GOWN DISPOSABLE) ×2 IMPLANT
GOWN STRL REUS W/TWL LRG LVL3 (GOWN DISPOSABLE) ×2
GOWN STRL REUS W/TWL XL LVL3 (GOWN DISPOSABLE) ×4
HANDPIECE INTERPULSE COAX TIP (DISPOSABLE) ×2
HOOD PEEL AWAY FACE SHEILD DIS (HOOD) ×4 IMPLANT
IMMOBILIZER KNEE 22 UNIV (SOFTGOODS) ×2 IMPLANT
KIT BASIN OR (CUSTOM PROCEDURE TRAY) ×2 IMPLANT
KIT ROOM TURNOVER OR (KITS) ×2 IMPLANT
MANIFOLD NEPTUNE II (INSTRUMENTS) ×2 IMPLANT
NDL HYPO 21X1 ECLIPSE (NEEDLE) ×1 IMPLANT
NEEDLE HYPO 21X1 ECLIPSE (NEEDLE) ×2 IMPLANT
NS IRRIG 1000ML POUR BTL (IV SOLUTION) ×2 IMPLANT
PACK TOTAL JOINT (CUSTOM PROCEDURE TRAY) ×2 IMPLANT
PACK UNIVERSAL I (CUSTOM PROCEDURE TRAY) ×1 IMPLANT
PAD ARMBOARD 7.5X6 YLW CONV (MISCELLANEOUS) ×4 IMPLANT
SET HNDPC FAN SPRY TIP SCT (DISPOSABLE) ×1 IMPLANT
SPONGE GAUZE 4X4 12PLY STER LF (GAUZE/BANDAGES/DRESSINGS) ×1 IMPLANT
STAPLER VISISTAT 35W (STAPLE) IMPLANT
STRIP CLOSURE SKIN 1/2X4 (GAUZE/BANDAGES/DRESSINGS) ×3 IMPLANT
SUCTION FRAZIER HANDLE 10FR (MISCELLANEOUS)
SUCTION TUBE FRAZIER 10FR DISP (MISCELLANEOUS) IMPLANT
SUT MNCRL AB 3-0 PS2 18 (SUTURE) IMPLANT
SUT VIC AB 0 CT1 27 (SUTURE) ×4
SUT VIC AB 0 CT1 27XBRD ANBCTR (SUTURE) ×2 IMPLANT
SUT VIC AB 2-0 CT1 27 (SUTURE) ×4
SUT VIC AB 2-0 CT1 TAPERPNT 27 (SUTURE) ×2 IMPLANT
SUT VLOC 180 0 24IN GS25 (SUTURE) ×2 IMPLANT
SYR 50ML LL SCALE MARK (SYRINGE) ×2 IMPLANT
TOWEL OR 17X24 6PK STRL BLUE (TOWEL DISPOSABLE) ×2 IMPLANT
TOWEL OR 17X26 10 PK STRL BLUE (TOWEL DISPOSABLE) ×2 IMPLANT
TRAY FOLEY CATH 14FR (SET/KITS/TRAYS/PACK) IMPLANT
UPCHARGE REV TRAY MBT KNEE ×1 IMPLANT
WATER STERILE IRR 1000ML POUR (IV SOLUTION) ×2 IMPLANT

## 2016-03-10 NOTE — Anesthesia Preprocedure Evaluation (Signed)
Anesthesia Evaluation  Patient identified by MRN, date of birth, ID band Patient awake    Reviewed: Allergy & Precautions, NPO status , Patient's Chart, lab work & pertinent test results  Airway Mallampati: I  TM Distance: >3 FB Neck ROM: Full    Dental   Pulmonary    Pulmonary exam normal        Cardiovascular Normal cardiovascular exam     Neuro/Psych    GI/Hepatic GERD  Controlled and Medicated,  Endo/Other    Renal/GU      Musculoskeletal   Abdominal   Peds  Hematology   Anesthesia Other Findings   Reproductive/Obstetrics                             Anesthesia Physical Anesthesia Plan  ASA: II  Anesthesia Plan: Spinal   Post-op Pain Management:    Induction: Intravenous  Airway Management Planned: Natural Airway  Additional Equipment:   Intra-op Plan:   Post-operative Plan:   Informed Consent: I have reviewed the patients History and Physical, chart, labs and discussed the procedure including the risks, benefits and alternatives for the proposed anesthesia with the patient or authorized representative who has indicated his/her understanding and acceptance.     Plan Discussed with: CRNA and Surgeon  Anesthesia Plan Comments:         Anesthesia Quick Evaluation

## 2016-03-10 NOTE — Progress Notes (Signed)
Lunch relief by Clare Gandy. Cook RN

## 2016-03-10 NOTE — Interval H&P Note (Signed)
History and Physical Interval Note:  03/10/2016 9:13 AM  Carlos Ellis  has presented today for surgery, with the diagnosis of LEFT KNEE DEGENERATIVE JOINT DISEASE  The various methods of treatment have been discussed with the patient and family. After consideration of risks, benefits and other options for treatment, the patient has consented to  Procedure(s): TOTAL KNEE ARTHROPLASTY (Left) as a surgical intervention .  The patient's history has been reviewed, patient examined, no change in status, stable for surgery.  I have reviewed the patient's chart and labs.  Questions were answered to the patient's satisfaction.     [name] G

## 2016-03-10 NOTE — Anesthesia Procedure Notes (Signed)
Spinal Patient location during procedure: OR Start time: 03/10/2016 10:10 AM End time: 03/10/2016 10:13 AM Staffing Anesthesiologist: Arta BruceSSEY, KEVIN Performed by: anesthesiologist  Preanesthetic Checklist Completed: patient identified, site marked, surgical consent, pre-op evaluation, timeout performed, IV checked, risks and benefits discussed and monitors and equipment checked Spinal Block Patient position: sitting Prep: Betadine Patient monitoring: heart rate, cardiac monitor, continuous pulse ox and blood pressure Approach: right paramedian Location: L3-4 Injection technique: single-shot Needle Needle type: Pencan  Needle gauge: 24 G Needle length: 9 cm Needle insertion depth: 7 cm

## 2016-03-10 NOTE — Evaluation (Signed)
Physical Therapy Evaluation Patient Details Name: Carlos Ellis MRN: [id number] DOB: Sep 27, 1955 Today's Date: 03/10/2016   History of Present Illness  Patient is a 60 y/o male with hx of gout, depression, borderline HLD presents s/p left TKA.   Clinical Impression  Patient presents with pain, pressure in lower abdomen and post surgical deficits LLE s/p left TKA. Tolerated static standing with Min A for balance. Trembling noted in RLE. Able to take a few steps to get back to bed with Min A for balance. Pt distracted by discomfort and pain. Mobility assessment limited due to above. Encouraged OOB to chair after in/out cath. Will follow acutely to perform further mobility assessment so pt can maximize independence and return home with wife.     Follow Up Recommendations Home health PT;Supervision for mobility/OOB    Equipment Recommendations  Rolling walker with 5" wheels    Recommendations for Other Services       Precautions / Restrictions Precautions Precautions: Knee Precaution Booklet Issued: No Precaution Comments: Reviewed no pillow under knee and precautions. Required Braces or Orthoses: Knee Immobilizer - Left Restrictions Weight Bearing Restrictions: Yes LLE Weight Bearing: Weight bearing as tolerated      Mobility  Bed Mobility Overal bed mobility: Needs Assistance Bed Mobility: Sit to Supine       Sit to supine: Min assist;HOB elevated   General bed mobility comments: Assist to bring LLE into bed. Pt able to scoot self up in bed using rails.  Transfers Overall transfer level: Needs assistance Equipment used: Rolling walker (2 wheeled) Transfers: Sit to/from [employer] Sit to Stand: Min assist Stand pivot transfers: Min assist       General transfer comment: Assist to boost to standing from [facility]. Able to take a few steps to get back to bed. Not able to tolerate more activity due to pain in lower abdomen and  knee.  Ambulation/Gait Ambulation/Gait assistance:  (Deferred due to above.)              Stairs            Wheelchair Mobility    Modified Rankin (Stroke Patients Only)       Balance Overall balance assessment: Needs assistance Sitting-balance support: Feet supported;No upper extremity supported Sitting balance-Leahy Scale: Fair     Standing balance support: During functional activity;Bilateral upper extremity supported Standing balance-Leahy Scale: Poor Standing balance comment: Reliant on BUEs for support- decreased sensation LLE due to nerve block.                             Pertinent Vitals/Pain Pain Assessment: 0-10 Pain Score: 6  Pain Location: left knee; 10 in lower abdomen Pain Descriptors / Indicators: Sore;Pressure Pain Intervention(s): Monitored during session;Repositioned;Limited activity within patient's tolerance    Home Living Family/patient expects to be discharged to:: Private residence Living Arrangements: Spouse/significant other Available Help at Discharge: Family Type of Home: House Home Access: Stairs to enter Entrance Stairs-Rails: Doctor, general practice of Steps: 4 Home Layout: Two level;Able to live on main level with bedroom/bathroom Home Equipment: None      Prior Function Level of Independence: Independent         Comments: Independent and still works.      Hand Dominance        Extremity/Trunk Assessment   Upper Extremity Assessment: Defer to OT evaluation           Lower Extremity Assessment: LLE deficits/detail  LLE Deficits / Details: not able to perform QS at this time; wearing KI.     Communication   Communication: No difficulties  Cognition Arousal/Alertness: Awake/alert Behavior During Therapy: Restless (Having pain in lower abdomen as pt not able to urinate. Very distracted and difficult to focus on task/questions due to pain/discomfort) Overall Cognitive Status: Within  Functional Limits for tasks assessed                      General Comments General comments (skin integrity, edema, etc.): Wife present in room during session.    Exercises        Assessment/Plan    PT Assessment Patient needs continued PT services  PT Diagnosis Difficulty walking;Generalized weakness;Acute pain   PT Problem List Decreased strength;Pain;Decreased range of motion;Impaired sensation;Decreased balance;Decreased mobility;Decreased knowledge of use of DME  PT Treatment Interventions Balance training;Gait training;Stair training;Functional mobility training;Therapeutic activities;Therapeutic exercise;Patient/family education   PT Goals (Current goals can be found in the Care Plan section) Acute Rehab PT Goals Patient Stated Goal: to pee PT Goal Formulation: With patient Time For Goal Achievement: 03/24/16 Potential to Achieve Goals: Good    Frequency 7X/week   Barriers to discharge Inaccessible home environment stairs to enter home    Co-evaluation               End of Session Equipment Utilized During Treatment: Left knee immobilizer;Gait belt Activity Tolerance: Treatment limited secondary to medical complications (Comment) (distracted by pain in lower abdomen; needing to urinate.) Patient left: in bed;with call bell/phone within reach;with bed alarm set;with family/visitor present Nurse Communication: Mobility status;Other (comment) (pt's need to urinate and having pressure in lower abdomen. RN notified of need for bladder scan.)         Time: 1553-1610 PT Time Calculation (min) (ACUTE ONLY): 17 min   Charges:   PT Evaluation $PT Eval Low Complexity: 1 Procedure     PT G Codes:        Carlos Ellis 03/10/2016, 4:19 PM Mylo RedShauna Etienne [place], PT, DPT [phone]

## 2016-03-10 NOTE — Op Note (Signed)
PREOP DIAGNOSIS: DJD LEFT KNEE POSTOP DIAGNOSIS:  same PROCEDURE: LEFT TKR ANESTHESIA: Spinal and MAC ATTENDING SURGEON: Carisa Backhaus G ASSISTANTElodia Florence PA  INDICATIONS FOR PROCEDURE: Carlos Ellis is a 60 y.o. male who has struggled for a long time with pain due to degenerative arthritis of the left knee.  The patient has failed many conservative non-operative measures and at this point has pain which limits the ability to sleep and walk.  The patient is offered total knee replacement.  Informed operative consent was obtained after discussion of possible risks of anesthesia, infection, neurovascular injury, DVT, and death.  The importance of the post-operative rehabilitation protocol to optimize result was stressed extensively with the patient.  SUMMARY OF FINDINGS AND PROCEDURE:  Carlos Ellis was taken to the operative suite where under the above anesthesia a left knee replacement was performed.  There were advanced degenerative changes and the bone quality was excellent.  We used the DePuyLCS system and placed size large femur, 6 MBT revision tibia, 41 mm all polyethylene patella, and a size 12.5 mm spacer.  Elodia Florence PA-C assisted throughout and was invaluable to the completion of the case in that he helped retract and maintain exposure while I placed the components.  He also helped close thereby minimizing OR time.  The patient was admitted for appropriate post-op care to include perioperative antibiotics and mechanical and pharmacologic measures for DVT prophylaxis.  DESCRIPTION OF PROCEDURE:  Carlos Ellis was taken to the operative suite where the above anesthesia was applied.  The patient was positioned supine and prepped and draped in normal sterile fashion.  An appropriate time out was performed.  After the administration of kefzol pre-op antibiotic the leg was elevated and exsanguinated and a tourniquet inflated.  A standard longitudinal incision was made on the anterior knee.   Dissection was carried down to the extensor mechanism.  All appropriate anti-infective measures were used including the pre-operative antibiotic, betadine impregnated drape, and closed hooded exhaust systems for each member of the surgical team.  A medial parapatellar incision was made in the extensor mechanism and the knee cap flipped and the knee flexed.  Some residual meniscal tissues were removed along with any remaining ACL/PCL tissue.  A guide was placed on the tibia and a flat cut was made on it's superior surface.  An intramedullary guide was placed in the femur and was utilized to make anterior and posterior cuts creating an appropriate flexion gap.  A second intramedullary guide was placed in the femur to make a distal cut properly balancing the knee with an extension gap equal to the flexion gap.  The three bones sized to the above mentioned sizes and the appropriate guides were placed and utilized.  A trial reduction was done and the knee easily came to full extension and the patella tracked well on flexion.  The trial components were removed and all bones were cleaned with pulsatile lavage and then dried thoroughly.  Cement was mixed and was pressurized onto the bones followed by placement of the aforementioned components.  Excess cement was trimmed and pressure was held on the components until the cement had hardened.  The tourniquet was deflated and a small amount of bleeding was controlled with cautery and pressure.  The knee was irrigated thoroughly.  The extensor mechanism was re-approximated with V-loc suture in running fashion.  The knee was flexed and the repair was solid.  The subcutaneous tissues were re-approximated with #0 and #2-0 vicryl and the skin closed with  a subcuticular stitch and steristrips.  A sterile dressing was applied.  Intraoperative fluids, EBL, and tourniquet time can be obtained from anesthesia records.  DISPOSITION:  The patient was taken to recovery room in stable  condition and admitted for appropriate post-op care to include peri-operative antibiotic and DVT prophylaxis with mechanical and pharmacologic measures.  Durenda Pechacek G 03/10/2016, 12:00 PM

## 2016-03-10 NOTE — Transfer of Care (Signed)
Immediate Anesthesia Transfer of Care Note  Patient: Carlos CommonStephen Ellis  Procedure(s) Performed: Procedure(s): TOTAL KNEE ARTHROPLASTY (Left)  Patient Location: PACU  Anesthesia Type:MAC and Spinal  Level of Consciousness: awake, alert , oriented and patient cooperative  Airway & Oxygen Therapy: Patient Spontanous Breathing and Patient connected to nasal cannula oxygen  Post-op Assessment: Report given to RN and Post -op Vital signs reviewed and stable  Post vital signs: Reviewed and stable  Last Vitals:  Filed Vitals:   03/10/16 0855 03/10/16 1234  BP: 146/81   Pulse: 72   Temp: 36.7 C 36.4 C  Resp: 20     Last Pain: There were no vitals filed for this visit.       Complications: No apparent anesthesia complications

## 2016-03-10 NOTE — Progress Notes (Signed)
Patient complains of lower abdominal pressure. States he is not sure if he needs to have a bowel movement or not.  Patient assisted by nurse and nurse tech to the bedside commode with walker.  Knee immobilizer in place.  No bowel movement, just gas. Patient still stating, lots of pressure. Bladder scan revealed 920cc.  In and out cath attempted by nurse and charge nurse. No urine return. Patient has history of BPH with obstruction.  Called surgeon's office.  Received verbal order to place coude catheter.  Nurse from [facility] contacted to assist in placement. Nursing will continue to monitor and assist.

## 2016-03-10 NOTE — Anesthesia Postprocedure Evaluation (Signed)
Anesthesia Post Note  Patient: Carlos Ellis  Procedure(s) Performed: Procedure(s) (LRB): TOTAL KNEE ARTHROPLASTY (Left)  Patient location during evaluation: PACU Anesthesia Type: Spinal Level of consciousness: oriented and awake and alert Pain management: pain level controlled Vital Signs Assessment: post-procedure vital signs reviewed and stable Respiratory status: spontaneous breathing, respiratory function stable and patient connected to nasal cannula oxygen Cardiovascular status: blood pressure returned to baseline and stable Postop Assessment: no headache and no backache Anesthetic complications: no    Last Vitals:  Filed Vitals:   03/10/16 1330 03/10/16 1345  BP: 115/73 125/70  Pulse: 61 61  Temp:    Resp: 12 12    Last Pain:  Filed Vitals:   03/10/16 1351  PainSc: 0-No pain                 Aluel Schwarz DAVID

## 2016-03-10 NOTE — Progress Notes (Signed)
Orthopedic Tech Progress Note Patient Details:  Carlos CommonStephen Ellis 03/19/1956 [id number]  CPM Left Knee CPM Left Knee: On Left Knee Flexion (Degrees): 90 Left Knee Extension (Degrees): 0 Additional Comments: trapeze bar patient helper Viewed order from doctor's order list  Nikki DomCrawford, Floris Neuhaus 03/10/2016, 1:14 PM

## 2016-03-11 ENCOUNTER — Encounter (HOSPITAL_COMMUNITY): Payer: Self-pay | Admitting: Orthopaedic Surgery

## 2016-03-11 NOTE — Progress Notes (Signed)
Physical Therapy Treatment Patient Details Name: Carlos Ellis MRN: [id number] DOB: 11/10/1955 Today's Date: 03/11/2016    History of Present Illness Patient is a 60 y/o male with hx of gout, depression, borderline HLD presents s/p left TKA.     PT Comments    Pt progressing better with therapeutic intervention.  Pt c/o of dizziness only near end of gait training.  Will f/u in am to perform stair training.    Follow Up Recommendations  Home health PT;Supervision for mobility/OOB     Equipment Recommendations  Rolling walker with 5" wheels    Recommendations for Other Services       Precautions / Restrictions Precautions Precautions: Knee;Fall Precaution Booklet Issued: No Precaution Comments: Reviewed no pillow under knee and precautions. Required Braces or Orthoses: Knee Immobilizer - Left Restrictions Weight Bearing Restrictions: Yes LLE Weight Bearing: Weight bearing as tolerated    Mobility  Bed Mobility Overal bed mobility: Needs Assistance Bed Mobility: Sit to Supine       Sit to supine: Min assist   General bed mobility comments: Assist to bring LLE into bed.  Transfers Overall transfer level: Needs assistance Equipment used: Rolling walker (2 wheeled) Transfers: Sit to/from Stand Sit to Stand: Min guard Stand pivot transfers: Min assist       General transfer comment: Cues for hand placement and technique with RW.  Ambulation/Gait Ambulation/Gait assistance: Min guard Ambulation Distance (Feet): 42 Feet Assistive device: Rolling walker (2 wheeled) Gait Pattern/deviations: Step-to pattern;Wide base of support;Trunk flexed;Antalgic;Decreased stride length;Decreased stance time - left   Gait velocity interpretation: Below normal speed for age/gender General Gait Details: Cues for upright posture, weight shifting and increasing stride length.  Required close chair follow for safety.     Stairs            Wheelchair Mobility    Modified  Rankin (Stroke Patients Only)       Balance Overall balance assessment: Needs assistance   Sitting balance-Leahy Scale: Fair       Standing balance-Leahy Scale: Poor Standing balance comment: able to release grasp in standing to perform ADL                    Cognition Arousal/Alertness: Awake/alert Behavior During Therapy: WFL for tasks assessed/performed Overall Cognitive Status: Within Functional Limits for tasks assessed                      Exercises Total Joint Exercises Ankle Circles/Pumps: AROM;Left;10 reps;Supine Quad Sets: AROM;Left;10 reps;Supine Towel Squeeze: AROM;Left;10 reps;Supine Short Arc Quad: AROM;Left;10 reps;Supine Heel Slides: AROM;Left;10 reps;Supine Hip ABduction/ADduction: AROM;Left;10 reps;Supine Straight Leg Raises: AROM;Left;10 reps;Supine Goniometric ROM: 13-50 degrees.  L knee.    General Comments        Pertinent Vitals/Pain Pain Assessment: 0-10 Pain Score: 6  Pain Location: L knee Pain Descriptors / Indicators: Guarding;Grimacing;Aching Pain Intervention(s): Monitored during session;Repositioned    Home Living Family/patient expects to be discharged to:: Private residence Living Arrangements: Spouse/significant other Available Help at Discharge: Family Type of Home: House Home Access: Stairs to enter Entrance Stairs-Rails: Right;Left Home Layout: Two level;Able to live on main level with bedroom/bathroom Home Equipment: None      Prior Function Level of Independence: Independent          PT Goals (current goals can now be found in the care plan section) Acute Rehab PT Goals Patient Stated Goal: pain relief Potential to Achieve Goals: Good Progress towards PT goals: Progressing toward goals  Frequency  7X/week    PT Plan Current plan remains appropriate    Co-evaluation   Reason for Co-Treatment: For patient/therapist safety (due to dizziness in previous session)   OT goals addressed during  session: ADL's and self-care;Proper use of [employer] equipment and DME     End of Session Equipment Utilized During Treatment: Gait belt Activity Tolerance: Patient tolerated treatment well Patient left: in bed;with call bell/phone within reach;with bed alarm set;with family/visitor present     Time: 8295-6213 PT Time Calculation (min) (ACUTE ONLY): 30 min  Charges:  $Gait Training: 8-22 mins                    G Codes:      Florestine Avers Mar 18, 2016, 5:52 PM  Joycelyn Rua, PTA pager [phone]

## 2016-03-11 NOTE — Progress Notes (Signed)
Physical Therapy Treatment Patient Details Name: Carlos Ellis MRN: [id number] DOB: 1956-07-31 Today's Date: 03/11/2016    History of Present Illness Patient is a 60 y/o male with hx of gout, depression, borderline HLD presents s/p left TKA.     PT Comments    Pt performed increased mobility and advanced to gait training.  Pt reports mild dizziness which limits progress.  Pt progressing slow.  Will f/u this pm.    Follow Up Recommendations  Home health PT;Supervision for mobility/OOB     Equipment Recommendations  Rolling walker with 5" wheels    Recommendations for Other Services       Precautions / Restrictions Precautions Precautions: Knee Precaution Booklet Issued: No Precaution Comments: Reviewed no pillow under knee and precautions. Required Braces or Orthoses: Knee Immobilizer - Left Restrictions Weight Bearing Restrictions: Yes LLE Weight Bearing: Weight bearing as tolerated    Mobility  Bed Mobility Overal bed mobility: Needs Assistance Bed Mobility: Sit to Supine       Sit to supine: Min assist;HOB elevated   General bed mobility comments: Assist to bring LLE out of bed.   Transfers Overall transfer level: Needs assistance Equipment used: Rolling walker (2 wheeled) Transfers: Sit to/from Stand Sit to Stand: Min guard         General transfer comment: Cues for sequencing and RW placement for safe transition from bed to standing.  Reports mild dizziness in standing.    Ambulation/Gait Ambulation/Gait assistance: Min guard Ambulation Distance (Feet): 18 Feet (Gait limited due to dizziness.  ) Assistive device: Rolling walker (2 wheeled) Gait Pattern/deviations: Step-to pattern;Wide base of support;Trunk flexed;Antalgic;Decreased stride length;Decreased stance time - left;Decreased step length - right   Gait velocity interpretation: Below normal speed for age/gender General Gait Details: Cues for upright posture, weight shifting and increasing stride  length.  Required close chair follow for safety.     Stairs            Wheelchair Mobility    Modified Rankin (Stroke Patients Only)       Balance     Sitting balance-Leahy Scale: Fair       Standing balance-Leahy Scale: Poor                      Cognition Arousal/Alertness: Lethargic;Suspect due to medications Behavior During Therapy: [facility] for tasks assessed/performed Overall Cognitive Status: Within Functional Limits for tasks assessed                      Exercises Total Joint Exercises Ankle Circles/Pumps: AROM;Left;10 reps;Supine Quad Sets: AROM;Left;10 reps;Supine Towel Squeeze: AROM;Left;10 reps;Supine Short Arc Quad: AROM;Left;10 reps;Supine Heel Slides: AROM;Left;10 reps;Supine Hip ABduction/ADduction: AROM;Left;10 reps;Supine Straight Leg Raises: AROM;Left;10 reps;Supine Goniometric ROM: 13-50 degrees.  L knee.    General Comments        Pertinent Vitals/Pain Pain Assessment: 0-10 Pain Score: 8  Pain Location: L knee  Pain Descriptors / Indicators: Guarding;Grimacing Pain Intervention(s): Monitored during session;Repositioned;Ice applied    Home Living                      Prior Function            PT Goals (current goals can now be found in the care plan section) Acute Rehab PT Goals Patient Stated Goal: to pee Potential to Achieve Goals: Good Progress towards PT goals: Progressing toward goals    Frequency  7X/week    PT Plan Current plan remains  appropriate    Co-evaluation             End of Session Equipment Utilized During Treatment: Left knee immobilizer;Gait belt Activity Tolerance: Patient limited by fatigue (limited due to fatigue) Patient left: in bed;with call bell/phone within reach;with bed alarm set;with family/visitor present     Time: 4782-95621142-1212 PT Time Calculation (min) (ACUTE ONLY): 30 min  Charges:  $Gait Training: 8-22 mins $Therapeutic Exercise: 8-22 mins                     G Codes:      Florestine Aversimee J Javarie Crisp 03/11/2016, 2:58 PM  Joycelyn RuaAimee Dorla Guizar, PTA pager [phone]

## 2016-03-11 NOTE — Care Management Note (Signed)
Case Management Note  Patient Details  Name: Carlos Ellis MRN: [id number] Date of Birth: 09/24/1955  Subjective/Objective:  60 yr old gentleman s/p left total knee arthroplasty.                  Action/Plan: Case manager spoke with patient concerning Home Health and DME needs. Patient was preoperatively setup with [facility], no changes. Dme will be delivered to patient by [employer].   Expected Discharge Date:   03/12/16               Expected Discharge Plan:  Home w Home Health Services  In-House Referral:     Discharge planning Services  CM Consult  Post Acute Care Choice:  Durable Medical Equipment, Home Health Choice offered to:  Patient  DME Arranged:  3-N-1, Walker rolling, CPM DME Agency:  [employer]  HH Arranged:  PT HH Agency:  [facility]  Status of Service:  Completed, signed off  If discussed at Long Length of Stay Meetings, dates discussed:    Additional Comments:  Durenda GuthrieBrady, Marquette Piontek Naomi, RN 03/11/2016, 11:05 AM

## 2016-03-11 NOTE — Progress Notes (Signed)
Greig CastillaAndrew, [place]PA called and stated that we will remove the coude catheter in the morning. Report passed on to the night nurse.

## 2016-03-11 NOTE — Evaluation (Signed)
Occupational Therapy Evaluation and Discharge Patient Details Name: Carlos CommonStephen Ellis MRN: [id number] DOB: 01/22/1956 Today's Date: 03/11/2016    History of Present Illness Patient is a 60 y/o male with hx of gout, depression, borderline HLD presents s/p left TKA.    Clinical Impression   Pt and wife educated in use of AE and compensatory strategies for LB bathing and dressing. Educated in multiple uses of 3 in 1 and in use of leg lifter. Educated in safe footwear, technique for shower transfer and in transporting items safely with RW. Pt and wife verbalizing understanding.     Follow Up Recommendations  No OT follow up    Equipment Recommendations  3 in 1 bedside comode (pt prefers an extra wide)    Recommendations for Other Services       Precautions / Restrictions Precautions Precautions: Knee;Fall Precaution Booklet Issued: No Precaution Comments: Reviewed no pillow under knee and precautions. Required Braces or Orthoses: Knee Immobilizer - Left Restrictions Weight Bearing Restrictions: Yes LLE Weight Bearing: Weight bearing as tolerated      Mobility Bed Mobility Overal bed mobility: Needs Assistance Bed Mobility: Sit to Supine       Sit to supine: Min assist   General bed mobility comments: Assist to bring LLE into bed. Instructed in use of leg lifter.   Transfers Overall transfer level: Needs assistance Equipment used: Rolling walker (2 wheeled) Transfers: Sit to/from Stand Sit to Stand: Min guard         General transfer comment: Cues for hand placement and technique with RW.    Balance     Sitting balance-Leahy Scale: Fair       Standing balance-Leahy Scale: Poor Standing balance comment: able to release grasp in standing to perform ADL                            ADL Overall ADL's : Needs assistance/impaired Eating/Feeding: Independent;Sitting   Grooming: Wash/dry hands;Oral care;Standing;Min guard   Upper Body Bathing: Set  up;Sitting   Lower Body Bathing: Sit to/from stand;Moderate assistance Lower Body Bathing Details (indicate cue type and reason): instructed in use of long handled bath sponge Upper Body Dressing : Set up;Sitting   Lower Body Dressing: Moderate assistance;Sit to/from stand Lower Body Dressing Details (indicate cue type and reason): instructed in use of reacher, long shoe horn, and sock aid, educated in safe footwear Toilet Transfer: Min IT sales professionalguard;RW;Ambulation Toilet Transfer Details (indicate cue type and reason): stood to urinate, instructed in use of 3 in 1 over toilet Toileting- ArchitectClothing Manipulation and Hygiene: Min guard;Sit to/from Nurse, children'sstand     Tub/Shower Transfer Details (indicate cue type and reason): instructed in use of 3 in 1 as shower seat and technique for shower transfer Functional mobility during ADLs: Min guard;Rolling walker General ADL Comments: Instructed in use of leg lifter     Vision     Perception     Praxis      Pertinent Vitals/Pain Pain Assessment: 0-10 Pain Score: 7  Pain Location: L knee Pain Descriptors / Indicators: Aching;Grimacing;Guarding;Moaning Pain Intervention(s): Monitored during session;Premedicated before session;Repositioned;Ice applied     Hand Dominance Right   Extremity/Trunk Assessment Upper Extremity Assessment Upper Extremity Assessment: Overall WFL for tasks assessed   Lower Extremity Assessment Lower Extremity Assessment: Defer to PT evaluation       Communication Communication Communication: No difficulties   Cognition Arousal/Alertness: Awake/alert Behavior During Therapy: Flat affect Overall Cognitive Status: Within Functional Limits for  tasks assessed                     General Comments       Exercises       Shoulder Instructions      Home Living Family/patient expects to be discharged to:: Private residence Living Arrangements: Spouse/significant other Available Help at Discharge: Family Type of  Home: House Home Access: Stairs to enter [employer] of Steps: 4 Entrance Stairs-Rails: Right;Left Home Layout: Two level;Able to live on main level with bedroom/bathroom     Bathroom Shower/Tub: Producer, television/film/video: Standard     Home Equipment: None          Prior Functioning/Environment Level of Independence: Independent             OT Diagnosis: Generalized weakness;Acute pain   OT Problem List:     OT Treatment/Interventions:      OT Goals(Current goals can be found in the care plan section) Acute Rehab OT Goals Patient Stated Goal: pain relief  OT Frequency:     Barriers to D/C:            Co-evaluation PT/OT/SLP Co-Evaluation/Treatment: Yes Reason for Co-Treatment: For patient/therapist safety (dove tailed with PT to follow with chair)   OT goals addressed during session: ADL's and self-care;Proper use of Adaptive equipment and DME      End of Session Equipment Utilized During Treatment: Gait belt;Rolling walker Nurse Communication:  (pt urinated)  Activity Tolerance: Patient tolerated treatment well Patient left: in bed;with call bell/phone within reach;with nursing/sitter in room   Time: 1500-1543 OT Time Calculation (min): 43 min Charges:  OT General Charges $OT Visit: 1 Procedure OT Evaluation $OT Eval Low Complexity: 1 Procedure G-Codes:    Evern Bio 03/11/2016, 3:55 PM  [phone]

## 2016-03-12 NOTE — Progress Notes (Signed)
Physical Therapy Treatment Patient Details Name: Carlos Ellis MRN: [id number] DOB: June 28, 1956 Today's Date: 03/12/2016    History of Present Illness Patient is a 60 y/o male with hx of gout, depression, borderline HLD presents s/p left TKA.     PT Comments    Pt performed increased mobility and did not require chair follow this am.  Pt remains to c/o slight dizziness but able to progress gait distance.  Increased flexion noted but remains to require education and cues to improve L  Knee extension.    Follow Up Recommendations  Home health PT;Supervision for mobility/OOB     Equipment Recommendations  Rolling walker with 5" wheels    Recommendations for Other Services       Precautions / Restrictions Precautions Precautions: Knee;Fall Precaution Booklet Issued: No Precaution Comments: Reviewed no pillow under knee and precautions. Required Braces or Orthoses: Knee Immobilizer - Left Restrictions Weight Bearing Restrictions: Yes LLE Weight Bearing: Weight bearing as tolerated    Mobility  Bed Mobility Overal bed mobility: Needs Assistance Bed Mobility: Sit to Supine       Sit to supine: Min assist   General bed mobility comments: Assist to bring LLE out of bed.  Attempted hooking method and reports discomfort requiring assist to complete transition.    Transfers Overall transfer level: Needs assistance Equipment used: Rolling walker (2 wheeled) Transfers: Sit to/from Stand Sit to Stand: Supervision Stand pivot transfers: Min guard       General transfer comment: Cues for hand placement and technique with RW. Cues to advance LLE forward.  Ambulation/Gait Ambulation/Gait assistance: Min guard Ambulation Distance (Feet): 84 Feet Assistive device: Rolling walker (2 wheeled) Gait Pattern/deviations: Step-to pattern;Wide base of support;Decreased stride length;Decreased stance time - left;Decreased step length - right   Gait velocity interpretation: Below normal  speed for age/gender General Gait Details: Cues for gait symmetry and upper trunk control.  remains to c/o slight dizziness but able to advance gait distance.     Stairs            Wheelchair Mobility    Modified Rankin (Stroke Patients Only)       Balance Overall balance assessment: Needs assistance   Sitting balance-Leahy Scale: Fair       Standing balance-Leahy Scale: Poor                      Cognition Arousal/Alertness: Awake/alert Behavior During Therapy: WFL for tasks assessed/performed Overall Cognitive Status: Within Functional Limits for tasks assessed                      Exercises Total Joint Exercises Ankle Circles/Pumps: AROM;Left;10 reps;Supine Quad Sets: AROM;Left;10 reps;Supine Towel Squeeze: AROM;Left;10 reps;Supine Short Arc Quad: AROM;Left;10 reps;Supine Heel Slides: AROM;Left;10 reps;Supine Hip ABduction/ADduction: AROM;Left;10 reps;Supine Straight Leg Raises: AROM;Left;10 reps;Supine Goniometric ROM: 15-64 degrees.  L knee.      General Comments        Pertinent Vitals/Pain Pain Assessment: 0-10 Pain Score: 6  Pain Location: L knee Pain Descriptors / Indicators: Grimacing;Guarding;Aching Pain Intervention(s): Monitored during session;Repositioned;Ice applied    Home Living                      Prior Function            PT Goals (current goals can now be found in the care plan section) Acute Rehab PT Goals Patient Stated Goal: pain relief Potential to Achieve Goals: Good Progress towards PT  goals: Progressing toward goals    Frequency  7X/week    PT Plan Current plan remains appropriate    Co-evaluation             End of Session Equipment Utilized During Treatment: Gait belt Activity Tolerance: Patient tolerated treatment well Patient left: with call bell/phone within reach;with bed alarm set;with family/visitor present;in chair     Time: [id number] PT Time Calculation (min) (ACUTE  ONLY): 30 min  Charges:  $Gait Training: 8-22 mins $Therapeutic Exercise: 8-22 mins                    G Codes:      Florestine Aversimee J Chyanna Flock 03/12/2016, 11:39 AM Joycelyn RuaAimee Teisha Trowbridge, PTA pager [phone]

## 2016-03-12 NOTE — Progress Notes (Signed)
Orthopedic Tech Progress Note Patient Details:  Carlos CommonStephen Ellis 04/23/1956 [id number]  CPM Left Knee CPM Left Knee: On Left Knee Flexion (Degrees): 65 Left Knee Extension (Degrees): 0 Additional Comments: trapeze bar patient helper   Saul FordyceJennifer C Elverta Dimiceli 03/12/2016, 2:21 PM

## 2016-03-12 NOTE — Progress Notes (Signed)
Orthopedic Tech Progress Note Patient Details:  Margo CommonStephen Lamons 02/10/1956 [id number]  Ortho Devices Type of Ortho Device: Crutches Ortho Device/Splint Interventions: Application   Saul FordyceJennifer C Vyron Fronczak 03/12/2016, 3:12 PM

## 2016-03-12 NOTE — Progress Notes (Signed)
Orthopedic Tech Progress Note Patient Details:  Carlos CommonStephen Ellis 02/24/1956 [id number]  Patient ID: Carlos CommonStephen Ellis, male   DOB: 12/25/1955, 60 y.o.   MRN: [id number] Applied cpm 0-65  Trinna PostMartinez, Carlos Ellis 03/12/2016, 6:05 AM

## 2016-03-12 NOTE — Progress Notes (Signed)
Physical Therapy Treatment Patient Details Name: Carlos Ellis MRN: [id number] DOB: 12-11-1955 Today's Date: 03/12/2016    History of Present Illness Patient is a 60 y/o male with hx of gout, depression, borderline HLD presents s/p left TKA.     PT Comments    Pt performed increased mobility and performed stair training during session.  Will f/u in am to advance number of stairs negotiated to simulate home environment.  Pt remains to make good progress with PT.    Follow Up Recommendations  Home health PT;Supervision for mobility/OOB     Equipment Recommendations  Rolling walker with 5" wheels    Recommendations for Other Services       Precautions / Restrictions Precautions Precautions: Knee;Fall Precaution Booklet Issued: No Precaution Comments: Reviewed no pillow under knee and precautions. Required Braces or Orthoses: Knee Immobilizer - Left Restrictions Weight Bearing Restrictions: Yes LLE Weight Bearing: Weight bearing as tolerated    Mobility  Bed Mobility Overal bed mobility: Needs Assistance Bed Mobility: Sit to Supine;Supine to Sit     Supine to sit: Supervision     General bed mobility comments: Pt able to perform OOB with gravity assisting LLE, required strap to Lift LLE into bed.  Pt moving more independently physically but remains to require cueing for strategies.    Transfers Overall transfer level: Needs assistance Equipment used: Rolling walker (2 wheeled) Transfers: Sit to/from Stand Sit to Stand: Supervision Stand pivot transfers: Supervision       General transfer comment: Cues for hand placement and technique with RW, Cues to advance LLE forward to improve ease and decrease pain during transition.    Ambulation/Gait Ambulation/Gait assistance: Min guard Ambulation Distance (Feet): 70 Feet (x2 to and from rehab gym.  Pt with noticeable fatigue and mild DOE.  )   Gait Pattern/deviations: Step-through pattern;Antalgic;Decreased stride  length;Wide base of support     General Gait Details: Remains to require cues for gait symmetry and forward gaze.     Stairs  Pt performed stair training with L rail and sideways negotiation x2 stairs.  Pt fatigued and required min guard for safety with max VCs for sequencing.            Wheelchair Mobility    Modified Rankin (Stroke Patients Only)       Balance Overall balance assessment: Needs assistance   Sitting balance-Leahy Scale: Good       Standing balance-Leahy Scale: Fair                      Cognition Arousal/Alertness: Awake/alert Behavior During Therapy: WFL for tasks assessed/performed Overall Cognitive Status: Within Functional Limits for tasks assessed                      Exercises Total Joint Exercises Ankle Circles/Pumps: AROM;Left;10 reps;Supine Quad Sets: AROM;Left;10 reps;Supine Towel Squeeze: AROM;Left;10 reps;Supine Short Arc Quad: AROM;Left;10 reps;Supine Heel Slides: AROM;Left;10 reps;Supine Straight Leg Raises: AROM;Left;10 reps;Supine    General Comments        Pertinent Vitals/Pain Pain Assessment: 0-10 Pain Score: 6  Pain Location: L knee Pain Descriptors / Indicators: Grimacing;Guarding;Aching Pain Intervention(s): Monitored during session;Repositioned    Home Living                      Prior Function            PT Goals (current goals can now be found in the care plan section) Acute Rehab PT Goals  Patient Stated Goal: pain relief Potential to Achieve Goals: Good Progress towards PT goals: Progressing toward goals    Frequency  7X/week    PT Plan Current plan remains appropriate    Co-evaluation             End of Session Equipment Utilized During Treatment: Gait belt Activity Tolerance: Patient tolerated treatment well Patient left: with call bell/phone within reach;with bed alarm set;with family/visitor present;in chair     Time: [id number] PT Time Calculation (min)  (ACUTE ONLY): 36 min  Charges:  $Gait Training: 8-22 mins $Therapeutic Exercise: 8-22 mins                    G Codes:      Florestine Aversimee J Shiri Hodapp 03/12/2016, 4:43 PM  Joycelyn RuaAimee Kiah Vanalstine, PTA pager [phone]

## 2016-03-12 NOTE — Progress Notes (Signed)
Subjective: 2 Days Post-Op Procedure(s) (LRB): TOTAL KNEE ARTHROPLASTY (Left)  Activity level:  wbat Diet tolerance:  ok Voiding:  ok Patient reports pain as mild.    Objective: Vital signs in last 24 hours: Temp:  [98.3 F (36.8 C)-100.2 F (37.9 C)] 98.3 F (36.8 C) (07/20 0416) Pulse Rate:  [89-99] 89 (07/20 0416) Resp:  [16-18] 18 (07/20 0416) BP: (141-154)/(67-75) 141/67 mmHg (07/20 0416) SpO2:  [93 %-95 %] 95 % (07/20 0416)  Labs: No results for input(s): HGB in the last 72 hours. No results for input(s): WBC, RBC, HCT, PLT in the last 72 hours. No results for input(s): NA, K, CL, CO2, BUN, CREATININE, GLUCOSE, CALCIUM in the last 72 hours. No results for input(s): LABPT, INR in the last 72 hours.  Physical Exam:  Neurologically intact ABD soft Neurovascular intact Sensation intact distally Intact pulses distally Dorsiflexion/Plantar flexion intact Incision: dressing C/D/I and no drainage No cellulitis present Compartment soft  Assessment/Plan:  2 Days Post-Op Procedure(s) (LRB): TOTAL KNEE ARTHROPLASTY (Left) Advance diet Up with therapy Plan for discharge tomorrow Discharge home with home health  I changed dressing to aquacel today. Continue on ASA 325mg  BID x 2 weeks post op. Follow up in office 2 weeks post op.  Verity Gilcrest, Ginger OrganNDREW PAUL 03/12/2016, 12:59 PM

## 2016-03-13 MED ORDER — METHOCARBAMOL 500 MG PO TABS: 500.0000 mg | ORAL_TABLET | Freq: Four times a day (QID) | ORAL | Status: DC | PRN

## 2016-03-13 MED ORDER — HYDROCODONE-ACETAMINOPHEN 5-325 MG PO TABS: 1.0000 | ORAL_TABLET | ORAL | Status: DC | PRN

## 2016-03-13 MED ORDER — ASPIRIN 325 MG PO TBEC: 325.0000 mg | DELAYED_RELEASE_TABLET | Freq: Two times a day (BID) | ORAL | Status: DC

## 2016-03-13 NOTE — Progress Notes (Signed)
Pt stable for d/c home today per MD. Pt met PT goals, and necessary equipment was delivered to pt's room. His standard rolling walker was exchanged for a wide rolling walker per his request. Pain is controlled on PO pain meds. Discharge instructions and prescriptions reviewed with pt and his wife, they denied any questions. Pt was assisted to car in wheelchair by volunteer.   [place], Jerry Caras

## 2016-03-13 NOTE — Discharge Summary (Signed)
Patient ID: Carlos CommonStephen Ellis MRN: [id number] DOB/AGE: 58/04/1956 58 y.o.  Admit date: 03/10/2016 Discharge date: 03/13/2016  Admission Diagnoses:  Principal Problem:   Primary osteoarthritis of left knee   Discharge Diagnoses:  Same  Past Medical History  Diagnosis Date  . Borderline hyperlipidemia   . Gout   . Acne     Dr. Katrinka BlazingSmith (was on accutane at one point)  . Episcleritis of right eye     w/mild scleritis right eye ([facility] opht 2011); also with hx of ocular varicella at age 89.  . Hypogonadism male 2009  . Bronchitis 10/06/2011  . Osteoarthritis of left knee     Dr. Turner Danielsowan  . Depression     Zoloft 2005  . Nephrolithiasis   . 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  Urol following PSA closely.  Marland Kitchen. BPH with obstruction/lower urinary tract symptoms 2015    finasteride trial 09/2015  . Elevated blood pressure reading without diagnosis of hypertension   . GERD (gastroesophageal reflux disease)     Tums    Surgeries: Procedure(s): TOTAL KNEE ARTHROPLASTY on 03/10/2016   Consultants:    Discharged Condition: Improved  Hospital Course: Carlos CommonStephen Laskin is an 60 y.o. male who was admitted 03/10/2016 for operative treatment ofPrimary osteoarthritis of left knee. Patient has severe unremitting pain that affects sleep, daily activities, and work/hobbies. After pre-op clearance the patient was taken to the operating room on 03/10/2016 and underwent  Procedure(s): TOTAL KNEE ARTHROPLASTY.    Patient was given perioperative antibiotics: Anti-infectives    Start     Dose/Rate Route Frequency Ordered Stop   03/10/16 1600  ceFAZolin (ANCEF) IVPB 2g/100 mL premix     2 g 200 mL/hr over 30 Minutes Intravenous Every 6 hours 03/10/16 1445 03/10/16 2215   03/10/16 1000  ceFAZolin (ANCEF) 3 g in dextrose 5 % 50 mL IVPB     3 g 130 mL/hr over 30 Minutes Intravenous To [facility] 03/09/16 1431  03/10/16 1011       Patient was given sequential compression devices, early ambulation, and chemoprophylaxis to prevent DVT.  Patient benefited maximally from hospital stay and there were no complications.    Recent vital signs: Patient Vitals for the past 24 hrs:  BP Temp Temp src Pulse Resp SpO2  03/13/16 0654 128/62 mmHg 99.2 F (37.3 C) Oral 95 18 97 %  03/12/16 2015 138/68 mmHg 99.7 F (37.6 C) Oral (!) 108 18 100 %  03/12/16 1459 126/63 mmHg 98.9 F (37.2 C) Oral 100 18 96 %     Recent laboratory studies: No results for input(s): WBC, HGB, HCT, PLT, NA, K, CL, CO2, BUN, CREATININE, GLUCOSE, INR, CALCIUM in the last 72 hours.  Invalid input(s): PT, 2   Discharge Medications:     Medication List    STOP taking these medications        ADVIL PM 200-25 MG Caps  Generic drug:  Ibuprofen-Diphenhydramine HCl     ibuprofen 200 MG tablet  Commonly known as:  ADVIL,MOTRIN     naproxen 500 MG tablet  Commonly known as:  NAPROSYN      TAKE these medications        aspirin 325 MG EC tablet  Take 1 tablet (325 mg total) by mouth 2 (two) times daily after a meal.     COLACE PO  Take 1 tablet by mouth daily.  DULCOLAX PO  Take 1 tablet by mouth as needed (was directed to take before surgery).     finasteride 5 MG tablet  Commonly known as:  PROSCAR  Take 5 mg by mouth daily.     HYDROcodone-acetaminophen 5-325 MG tablet  Commonly known as:  NORCO/VICODIN  Take 1-2 tablets by mouth every 4 (four) hours as needed for moderate pain.     methocarbamol 500 MG tablet  Commonly known as:  ROBAXIN  Take 1 tablet (500 mg total) by mouth every 6 (six) hours as needed for muscle spasms.     nystatin cream  Commonly known as:  MYCOSTATIN  Apply topically 2 (two) times daily.        Diagnostic Studies: Dg Chest 2 View  02/28/2016  CLINICAL DATA:  Preop total knee arthroplasty EXAM: CHEST  2 VIEW COMPARISON:  01/01/2006 FINDINGS: Lungs are clear.  No pleural effusion  or pneumothorax. The heart is normal in size. Mild degenerative changes of the visualized thoracolumbar spine. IMPRESSION: No evidence of acute cardiopulmonary disease. Electronically Signed   By: Charline Bills M.D.   On: 02/28/2016 17:07    Disposition:       Discharge Instructions    Call MD / Call 911    Complete by:  As directed   If you experience chest pain or shortness of breath, CALL 911 and be transported to the hospital emergency room.  If you develope a fever above 101 F, pus (white drainage) or increased drainage or redness at the wound, or calf pain, call your surgeon's office.     Constipation Prevention    Complete by:  As directed   Drink plenty of fluids.  Prune juice may be helpful.  You may use a stool softener, such as Colace (over the counter) 100 mg twice a day.  Use MiraLax (over the counter) for constipation as needed.     Diet - low sodium heart healthy    Complete by:  As directed      Discharge instructions    Complete by:  As directed   INSTRUCTIONS AFTER JOINT REPLACEMENT   Remove items at home which could result in a fall. This includes throw rugs or furniture in walking pathways ICE to the affected joint every three hours while awake for 30 minutes at a time, for at least the first 3-5 days, and then as needed for pain and swelling.  Continue to use ice for pain and swelling. You may notice swelling that will progress down to the foot and ankle.  This is normal after surgery.  Elevate your leg when you are not up walking on it.   Continue to use the breathing machine you got in the hospital (incentive spirometer) which will help keep your temperature down.  It is Ellis for your temperature to cycle up and down following surgery, especially at night when you are not up moving around and exerting yourself.  The breathing machine keeps your lungs expanded and your temperature down.   DIET:  As you were doing prior to hospitalization, we recommend a  well-balanced diet.  DRESSING / WOUND CARE / SHOWERING  You may shower 3 days after surgery, but keep the wounds dry during showering.  You may use an occlusive plastic wrap (Press'n Seal for example), NO SOAKING/SUBMERGING IN THE BATHTUB.  If the bandage gets wet, change with a clean dry gauze.  If the incision gets wet, pat the wound dry with a clean towel.  ACTIVITY  Increase activity slowly as tolerated, but follow the weight bearing instructions below.   No driving for 6 weeks or until further direction given by your physician.  You cannot drive while taking narcotics.  No lifting or carrying greater than 10 lbs. until further directed by your surgeon. Avoid periods of inactivity such as sitting longer than an hour when not asleep. This helps prevent blood clots.  You may return to work once you are authorized by your doctor.     WEIGHT BEARING   Weight bearing as tolerated with assist device (walker, cane, etc) as directed, use it as long as suggested by your surgeon or therapist, typically at least 4-6 weeks.   EXERCISES  Results after joint replacement surgery are often greatly improved when you follow the exercise, range of motion and muscle strengthening exercises prescribed by your doctor. Safety measures are also important to protect the joint from further injury. Any time any of these exercises cause you to have increased pain or swelling, decrease what you are doing until you are comfortable again and then slowly increase them. If you have problems or questions, call your caregiver or physical therapist for advice.   Rehabilitation is important following a joint replacement. After just a few days of immobilization, the muscles of the leg can become weakened and shrink (atrophy).  These exercises are designed to build up the tone and strength of the thigh and leg muscles and to improve motion. Often times heat used for twenty to thirty minutes before working out will loosen up  your tissues and help with improving the range of motion but do not use heat for the first two weeks following surgery (sometimes heat can increase post-operative swelling).   These exercises can be done on a training (exercise) mat, on the floor, on a table or on a bed. Use whatever works the best and is most comfortable for you.    Use music or television while you are exercising so that the exercises are a pleasant break in your day. This will make your life better with the exercises acting as a break in your routine that you can look forward to.   Perform all exercises about fifteen times, three times per day or as directed.  You should exercise both the operative leg and the other leg as well.   Exercises include:   Quad Sets - Tighten up the muscle on the front of the thigh (Quad) and hold for 5-10 seconds.   Straight Leg Raises - With your knee straight (if you were given a brace, keep it on), lift the leg to 60 degrees, hold for 3 seconds, and slowly lower the leg.  Perform this exercise against resistance later as your leg gets stronger.  Leg Slides: Lying on your back, slowly slide your foot toward your buttocks, bending your knee up off the floor (only go as far as is comfortable). Then slowly slide your foot back down until your leg is flat on the floor again.  Angel Wings: Lying on your back spread your legs to the side as far apart as you can without causing discomfort.  Hamstring Strength:  Lying on your back, push your heel against the floor with your leg straight by tightening up the muscles of your buttocks.  Repeat, but this time bend your knee to a comfortable angle, and push your heel against the floor.  You may put a pillow under the heel to make it more comfortable if necessary.  A rehabilitation program following joint replacement surgery can speed recovery and prevent re-injury in the future due to weakened muscles. Contact your doctor or a physical therapist for more  information on knee rehabilitation.    CONSTIPATION  Constipation is defined medically as fewer than three stools per week and severe constipation as less than one stool per week.  Even if you have a regular bowel pattern at home, your normal regimen is likely to be disrupted due to multiple reasons following surgery.  Combination of anesthesia, postoperative narcotics, change in appetite and fluid intake all can affect your bowels.   YOU MUST use at least one of the following options; they are listed in order of increasing strength to get the job done.  They are all available over the counter, and you may need to use some, POSSIBLY even all of these options:    Drink plenty of fluids (prune juice may be helpful) and high fiber foods Colace 100 mg by mouth twice a day  Senokot for constipation as directed and as needed Dulcolax (bisacodyl), take with full glass of water  Miralax (polyethylene glycol) once or twice a day as needed.  If you have tried all these things and are unable to have a bowel movement in the first 3-4 days after surgery call either your surgeon or your primary doctor.    If you experience loose stools or diarrhea, hold the medications until you stool forms back up.  If your symptoms do not get better within 1 week or if they get worse, check with your doctor.  If you experience "the worst abdominal pain ever" or develop nausea or vomiting, please contact the office immediately for further recommendations for treatment.   ITCHING:  If you experience itching with your medications, try taking only a single pain pill, or even half a pain pill at a time.  You can also use Benadryl over the counter for itching or also to help with sleep.   TED HOSE STOCKINGS:  Use stockings on both legs until for at least 2 weeks or as directed by physician office. They may be removed at night for sleeping.  MEDICATIONS:  See your medication summary on the "After Visit Summary" that nursing will  review with you.  You may have some home medications which will be placed on hold until you complete the course of blood thinner medication.  It is important for you to complete the blood thinner medication as prescribed.  PRECAUTIONS:  If you experience chest pain or shortness of breath - call 911 immediately for transfer to the hospital emergency department.   If you develop a fever greater that 101 F, purulent drainage from wound, increased redness or drainage from wound, foul odor from the wound/dressing, or calf pain - CONTACT YOUR SURGEON.                                                   FOLLOW-UP APPOINTMENTS:  If you do not already have a post-op appointment, please call the office for an appointment to be seen by your surgeon.  Guidelines for how soon to be seen are listed in your "After Visit Summary", but are typically between 1-4 weeks after surgery.  OTHER INSTRUCTIONS:   Knee Replacement:  Do not place pillow under knee, focus on keeping the knee straight while resting.  CPM instructions: 0-90 degrees, 2 hours in the morning, 2 hours in the afternoon, and 2 hours in the evening. Place foam block, curve side up under heel at all times except when in CPM or when walking.  DO NOT modify, tear, cut, or change the foam block in any way.  MAKE SURE YOU:  Understand these instructions.  Get help right away if you are not doing well or get worse.    Thank you for letting us be a part of your medical care team.  It is a privilege we respect greatly.  We hope these instructions will help you stay on track for a fast and full recovery!     Increase activity slowly as tolerated    Complete by:  As directed            Follow-up Information    Follow up with Velna Ochs, MD. Schedule an appointment as soon as possible for a visit in 2 weeks.   Specialty:  Orthopedic Surgery   Contact information:   [address] [place] [address] [phone]       Follow up with [facility].   Why:  Someone from [facility] will contact you to aarange start date and time for therapy.   Contact information:   [address] [place] [address] [phone]        Signed: Drema Halon 03/13/2016, 7:35 AM

## 2016-03-13 NOTE — Progress Notes (Signed)
Orthopedic Tech Progress Note Patient Details:  Carlos CommonStephen Ellis 10/23/1955 [id number]  CPM Left Knee CPM Left Knee: On Left Knee Flexion (Degrees): 75 Left Knee Extension (Degrees): 0 Additional Comments: trapeze bar patient helper   Saul FordyceJennifer C Olevia Westervelt 03/13/2016, 1:05 PM

## 2016-03-13 NOTE — Progress Notes (Signed)
Orthopedic Tech Progress Note Patient Details:  Margo CommonStephen Vink 11/20/1955 [id number]  Patient ID: Margo CommonStephen Escoe, male   DOB: 09/11/1955, 60 y.o.   MRN: [id number] Applied cpm 0-75  Trinna PostMartinez, Aundra Espin J 03/13/2016, 6:07 AM

## 2016-03-13 NOTE — Progress Notes (Signed)
Physical Therapy Treatment Patient Details Name: Carlos Ellis MRN: [id number] DOB: 1955-11-22 Today's Date: 03/13/2016    History of Present Illness Patient is a 60 y/o male with hx of gout, depression, borderline HLD presents s/p left TKA.     PT Comments    Pt performed increased mobility including completed HEP and stair training with cane.  Pt issued HEP and educated on frequency of program.  Pt set to d/c home.  Will f/u in pm if patient remains hospitalized.    Follow Up Recommendations  Home health PT;Supervision for mobility/OOB     Equipment Recommendations  Rolling walker with 5" wheels    Recommendations for Other Services       Precautions / Restrictions Precautions Precautions: Knee;Fall Precaution Booklet Issued: No Precaution Comments: Reviewed no pillow under knee and precautions. Required Braces or Orthoses: Knee Immobilizer - Left Restrictions Weight Bearing Restrictions: Yes LLE Weight Bearing: Weight bearing as tolerated    Mobility  Bed Mobility               General bed mobility comments: NT- pt recieved in recliner on arrival.    Transfers Overall transfer level: Needs assistance Equipment used: Rolling walker (2 wheeled) Transfers: Sit to/from Stand Sit to Stand: Supervision Stand pivot transfers: Supervision       General transfer comment: Cues for hand placement and technique with RW, Cues to advance LLE forward to improve ease and decrease pain during transition.    Ambulation/Gait Ambulation/Gait assistance: Supervision Ambulation Distance (Feet): 70 Feet (gait limited due to mild lightheadedness and fatigue post stair training.  ) Assistive device: Rolling walker (2 wheeled) Gait Pattern/deviations: Step-through pattern     General Gait Details: Remains to require cues for gait symmetry and forward gaze.     Stairs Stairs: Yes Stairs assistance: Min guard Stair Management: One rail Left;One rail Right;With  cane;Forwards;Step to pattern Number of Stairs: 4 General stair comments: L rail ascending and R rail descending.  Pt required cues for sequencing and use of cane.  Pt reports feeling more confident with technique with cane.  Wife educated on technique.    Wheelchair Mobility    Modified Rankin (Stroke Patients Only)       Balance     Sitting balance-Leahy Scale: Good       Standing balance-Leahy Scale: Fair                      Cognition Arousal/Alertness: Awake/alert Behavior During Therapy: WFL for tasks assessed/performed Overall Cognitive Status: Within Functional Limits for tasks assessed                      Exercises Total Joint Exercises Ankle Circles/Pumps: AROM;Left;10 reps;Supine Quad Sets: AROM;Left;10 reps;Supine Towel Squeeze: AROM;Left;10 reps;Supine Short Arc Quad: AROM;Left;10 reps;Supine Heel Slides: AROM;Left;10 reps;Supine Hip ABduction/ADduction: AROM;Left;10 reps;Supine Straight Leg Raises: AROM;Left;10 reps;Supine Long Arc Quad: AROM;Left;10 reps;Seated Knee Flexion: AROM;AAROM;Left;10 reps;Seated Goniometric ROM: 11-66 degrees. L knee.    General Comments        Pertinent Vitals/Pain Pain Assessment: 0-10 Pain Score: 6  Pain Descriptors / Indicators: Grimacing;Guarding;Aching Pain Intervention(s): Monitored during session;Repositioned;Ice applied    Home Living                      Prior Function            PT Goals (current goals can now be found in the care plan section) Acute Rehab PT Goals Patient  Stated Goal: pain relief Potential to Achieve Goals: Good Progress towards PT goals: Progressing toward goals    Frequency  7X/week    PT Plan Current plan remains appropriate    Co-evaluation             End of Session Equipment Utilized During Treatment: Gait belt Activity Tolerance: Patient tolerated treatment well Patient left: with call bell/phone within reach;with bed alarm set;with  family/visitor present;in chair     Time: [id number] PT Time Calculation (min) (ACUTE ONLY): 34 min  Charges:  $Gait Training: 8-22 mins $Therapeutic Exercise: 8-22 mins                    G Codes:      Carlos Ellis 03/13/2016, 12:11 PM  Carlos Ellis, PTA pager [phone]

## 2016-03-14 DIAGNOSIS — Z7982 Long term (current) use of aspirin: Secondary | ICD-10-CM | POA: Diagnosis not present

## 2016-03-14 DIAGNOSIS — Z96652 Presence of left artificial knee joint: Secondary | ICD-10-CM | POA: Diagnosis not present

## 2016-03-14 DIAGNOSIS — F329 Major depressive disorder, single episode, unspecified: Secondary | ICD-10-CM | POA: Diagnosis not present

## 2016-03-14 DIAGNOSIS — N400 Enlarged prostate without lower urinary tract symptoms: Secondary | ICD-10-CM | POA: Diagnosis not present

## 2016-03-14 DIAGNOSIS — Z471 Aftercare following joint replacement surgery: Secondary | ICD-10-CM | POA: Diagnosis not present

## 2016-03-14 DIAGNOSIS — K219 Gastro-esophageal reflux disease without esophagitis: Secondary | ICD-10-CM | POA: Diagnosis not present

## 2016-03-14 DIAGNOSIS — R030 Elevated blood-pressure reading, without diagnosis of hypertension: Secondary | ICD-10-CM | POA: Diagnosis not present

## 2016-03-16 DIAGNOSIS — Z7982 Long term (current) use of aspirin: Secondary | ICD-10-CM | POA: Diagnosis not present

## 2016-03-16 DIAGNOSIS — Z471 Aftercare following joint replacement surgery: Secondary | ICD-10-CM | POA: Diagnosis not present

## 2016-03-16 DIAGNOSIS — Z96652 Presence of left artificial knee joint: Secondary | ICD-10-CM | POA: Diagnosis not present

## 2016-03-16 DIAGNOSIS — N400 Enlarged prostate without lower urinary tract symptoms: Secondary | ICD-10-CM | POA: Diagnosis not present

## 2016-03-16 DIAGNOSIS — F329 Major depressive disorder, single episode, unspecified: Secondary | ICD-10-CM | POA: Diagnosis not present

## 2016-03-16 DIAGNOSIS — R030 Elevated blood-pressure reading, without diagnosis of hypertension: Secondary | ICD-10-CM | POA: Diagnosis not present

## 2016-03-16 DIAGNOSIS — K219 Gastro-esophageal reflux disease without esophagitis: Secondary | ICD-10-CM | POA: Diagnosis not present

## 2016-03-18 DIAGNOSIS — F329 Major depressive disorder, single episode, unspecified: Secondary | ICD-10-CM | POA: Diagnosis not present

## 2016-03-18 DIAGNOSIS — N400 Enlarged prostate without lower urinary tract symptoms: Secondary | ICD-10-CM | POA: Diagnosis not present

## 2016-03-18 DIAGNOSIS — Z7982 Long term (current) use of aspirin: Secondary | ICD-10-CM | POA: Diagnosis not present

## 2016-03-18 DIAGNOSIS — Z96652 Presence of left artificial knee joint: Secondary | ICD-10-CM | POA: Diagnosis not present

## 2016-03-18 DIAGNOSIS — K219 Gastro-esophageal reflux disease without esophagitis: Secondary | ICD-10-CM | POA: Diagnosis not present

## 2016-03-18 DIAGNOSIS — Z471 Aftercare following joint replacement surgery: Secondary | ICD-10-CM | POA: Diagnosis not present

## 2016-03-18 DIAGNOSIS — R030 Elevated blood-pressure reading, without diagnosis of hypertension: Secondary | ICD-10-CM | POA: Diagnosis not present

## 2016-03-20 DIAGNOSIS — Z96652 Presence of left artificial knee joint: Secondary | ICD-10-CM | POA: Diagnosis not present

## 2016-03-20 DIAGNOSIS — Z471 Aftercare following joint replacement surgery: Secondary | ICD-10-CM | POA: Diagnosis not present

## 2016-03-20 DIAGNOSIS — R030 Elevated blood-pressure reading, without diagnosis of hypertension: Secondary | ICD-10-CM | POA: Diagnosis not present

## 2016-03-20 DIAGNOSIS — Z7982 Long term (current) use of aspirin: Secondary | ICD-10-CM | POA: Diagnosis not present

## 2016-03-20 DIAGNOSIS — M1712 Unilateral primary osteoarthritis, left knee: Secondary | ICD-10-CM | POA: Diagnosis not present

## 2016-03-20 DIAGNOSIS — F329 Major depressive disorder, single episode, unspecified: Secondary | ICD-10-CM | POA: Diagnosis not present

## 2016-03-20 DIAGNOSIS — K219 Gastro-esophageal reflux disease without esophagitis: Secondary | ICD-10-CM | POA: Diagnosis not present

## 2016-03-20 DIAGNOSIS — N400 Enlarged prostate without lower urinary tract symptoms: Secondary | ICD-10-CM | POA: Diagnosis not present

## 2016-03-24 DIAGNOSIS — R030 Elevated blood-pressure reading, without diagnosis of hypertension: Secondary | ICD-10-CM | POA: Diagnosis not present

## 2016-03-24 DIAGNOSIS — F329 Major depressive disorder, single episode, unspecified: Secondary | ICD-10-CM | POA: Diagnosis not present

## 2016-03-24 DIAGNOSIS — Z7982 Long term (current) use of aspirin: Secondary | ICD-10-CM | POA: Diagnosis not present

## 2016-03-24 DIAGNOSIS — Z96652 Presence of left artificial knee joint: Secondary | ICD-10-CM | POA: Diagnosis not present

## 2016-03-24 DIAGNOSIS — N400 Enlarged prostate without lower urinary tract symptoms: Secondary | ICD-10-CM | POA: Diagnosis not present

## 2016-03-24 DIAGNOSIS — K219 Gastro-esophageal reflux disease without esophagitis: Secondary | ICD-10-CM | POA: Diagnosis not present

## 2016-03-24 DIAGNOSIS — Z471 Aftercare following joint replacement surgery: Secondary | ICD-10-CM | POA: Diagnosis not present

## 2016-03-26 DIAGNOSIS — Z96652 Presence of left artificial knee joint: Secondary | ICD-10-CM | POA: Diagnosis not present

## 2016-03-26 DIAGNOSIS — Z471 Aftercare following joint replacement surgery: Secondary | ICD-10-CM | POA: Diagnosis not present

## 2016-03-26 DIAGNOSIS — N400 Enlarged prostate without lower urinary tract symptoms: Secondary | ICD-10-CM | POA: Diagnosis not present

## 2016-03-26 DIAGNOSIS — K219 Gastro-esophageal reflux disease without esophagitis: Secondary | ICD-10-CM | POA: Diagnosis not present

## 2016-03-26 DIAGNOSIS — Z7982 Long term (current) use of aspirin: Secondary | ICD-10-CM | POA: Diagnosis not present

## 2016-03-26 DIAGNOSIS — F329 Major depressive disorder, single episode, unspecified: Secondary | ICD-10-CM | POA: Diagnosis not present

## 2016-03-26 DIAGNOSIS — R030 Elevated blood-pressure reading, without diagnosis of hypertension: Secondary | ICD-10-CM | POA: Diagnosis not present

## 2016-03-28 DIAGNOSIS — F329 Major depressive disorder, single episode, unspecified: Secondary | ICD-10-CM | POA: Diagnosis not present

## 2016-03-28 DIAGNOSIS — Z471 Aftercare following joint replacement surgery: Secondary | ICD-10-CM | POA: Diagnosis not present

## 2016-03-28 DIAGNOSIS — Z7982 Long term (current) use of aspirin: Secondary | ICD-10-CM | POA: Diagnosis not present

## 2016-03-28 DIAGNOSIS — R030 Elevated blood-pressure reading, without diagnosis of hypertension: Secondary | ICD-10-CM | POA: Diagnosis not present

## 2016-03-28 DIAGNOSIS — K219 Gastro-esophageal reflux disease without esophagitis: Secondary | ICD-10-CM | POA: Diagnosis not present

## 2016-03-28 DIAGNOSIS — Z96652 Presence of left artificial knee joint: Secondary | ICD-10-CM | POA: Diagnosis not present

## 2016-03-28 DIAGNOSIS — N400 Enlarged prostate without lower urinary tract symptoms: Secondary | ICD-10-CM | POA: Diagnosis not present

## 2016-04-01 DIAGNOSIS — M1712 Unilateral primary osteoarthritis, left knee: Secondary | ICD-10-CM | POA: Diagnosis not present

## 2016-04-03 DIAGNOSIS — M1712 Unilateral primary osteoarthritis, left knee: Secondary | ICD-10-CM | POA: Diagnosis not present

## 2016-04-04 DIAGNOSIS — M1712 Unilateral primary osteoarthritis, left knee: Secondary | ICD-10-CM | POA: Diagnosis not present

## 2016-04-06 DIAGNOSIS — M1712 Unilateral primary osteoarthritis, left knee: Secondary | ICD-10-CM | POA: Diagnosis not present

## 2016-04-08 DIAGNOSIS — M1712 Unilateral primary osteoarthritis, left knee: Secondary | ICD-10-CM | POA: Diagnosis not present

## 2016-04-10 DIAGNOSIS — M1712 Unilateral primary osteoarthritis, left knee: Secondary | ICD-10-CM | POA: Diagnosis not present

## 2016-04-13 DIAGNOSIS — R972 Elevated prostate specific antigen [PSA]: Secondary | ICD-10-CM | POA: Diagnosis not present

## 2016-04-13 DIAGNOSIS — M1712 Unilateral primary osteoarthritis, left knee: Secondary | ICD-10-CM | POA: Diagnosis not present

## 2016-04-15 DIAGNOSIS — M1712 Unilateral primary osteoarthritis, left knee: Secondary | ICD-10-CM | POA: Diagnosis not present

## 2016-04-17 DIAGNOSIS — M1712 Unilateral primary osteoarthritis, left knee: Secondary | ICD-10-CM | POA: Diagnosis not present

## 2016-04-20 ENCOUNTER — Other Ambulatory Visit: Payer: Self-pay | Admitting: Urology

## 2016-04-20 ENCOUNTER — Telehealth: Payer: Self-pay | Admitting: Family Medicine

## 2016-04-20 DIAGNOSIS — R350 Frequency of micturition: Secondary | ICD-10-CM | POA: Diagnosis not present

## 2016-04-20 DIAGNOSIS — R972 Elevated prostate specific antigen [PSA]: Secondary | ICD-10-CM

## 2016-04-20 DIAGNOSIS — Z86010 Personal history of colonic polyps: Secondary | ICD-10-CM

## 2016-04-20 DIAGNOSIS — Z1211 Encounter for screening for malignant neoplasm of colon: Secondary | ICD-10-CM

## 2016-04-20 DIAGNOSIS — M1712 Unilateral primary osteoarthritis, left knee: Secondary | ICD-10-CM | POA: Diagnosis not present

## 2016-04-20 DIAGNOSIS — N401 Enlarged prostate with lower urinary tract symptoms: Secondary | ICD-10-CM | POA: Diagnosis not present

## 2016-04-20 NOTE — Telephone Encounter (Signed)
According to my notes, he got his [facility], so all he has to do is call them and set up a repeat colonoscopy.  I only need to do a referral if he wants to switch over to a DIFFERENT GI group.-thx

## 2016-04-20 NOTE — Telephone Encounter (Signed)
Please advise 

## 2016-04-20 NOTE — Telephone Encounter (Signed)
Pt received a notice from [employer] that it is time for his colonoscopy. He would like a referral to have his next one set up.

## 2016-04-21 ENCOUNTER — Telehealth: Payer: Self-pay | Admitting: Internal Medicine

## 2016-04-21 NOTE — Telephone Encounter (Signed)
Patient insists that he get a new referral to [facility].  Please order.

## 2016-04-21 NOTE — Telephone Encounter (Signed)
lmom for patient to call back 

## 2016-04-21 NOTE — Telephone Encounter (Signed)
OK, referral to [facility] ordered. 

## 2016-04-21 NOTE — Telephone Encounter (Signed)
Dr. Lurline IdolPyrtle--Doc of the Day. [facility] and placed on Dr. Lauro FranklinPyrtle's desk for review.

## 2016-04-22 ENCOUNTER — Encounter: Payer: Self-pay | Admitting: Family Medicine

## 2016-04-22 DIAGNOSIS — M1712 Unilateral primary osteoarthritis, left knee: Secondary | ICD-10-CM | POA: Diagnosis not present

## 2016-04-24 DIAGNOSIS — M1712 Unilateral primary osteoarthritis, left knee: Secondary | ICD-10-CM | POA: Diagnosis not present

## 2016-04-24 NOTE — Telephone Encounter (Signed)
Dr. Rhea BeltonPyrtle reviewed records and has accepted patient. Per Dr. Rhea BeltonPyrtle, patient is not due for recall colon until 04/2018. Recall Colon has been entered. Patient states that he is not having any problems and I informed him that our office will contact him when time to schedule colonoscopy. Records have been sent to scan.

## 2016-04-28 DIAGNOSIS — M1712 Unilateral primary osteoarthritis, left knee: Secondary | ICD-10-CM | POA: Diagnosis not present

## 2016-04-29 ENCOUNTER — Ambulatory Visit (HOSPITAL_COMMUNITY): Payer: BLUE CROSS/BLUE SHIELD

## 2016-04-30 DIAGNOSIS — M1712 Unilateral primary osteoarthritis, left knee: Secondary | ICD-10-CM | POA: Diagnosis not present

## 2016-05-01 ENCOUNTER — Ambulatory Visit (HOSPITAL_COMMUNITY)
Admission: RE | Admit: 2016-05-01 | Discharge: 2016-05-01 | Disposition: A | Discharge status: Home / Self Care | Payer: BLUE CROSS/BLUE SHIELD | Source: Ambulatory Visit | Attending: Urology | Admitting: Urology

## 2016-05-01 DIAGNOSIS — R972 Elevated prostate specific antigen [PSA]: Secondary | ICD-10-CM | POA: Diagnosis present

## 2016-05-01 DIAGNOSIS — K4090 Unilateral inguinal hernia, without obstruction or gangrene, not specified as recurrent: Secondary | ICD-10-CM | POA: Insufficient documentation

## 2016-05-01 DIAGNOSIS — N400 Enlarged prostate without lower urinary tract symptoms: Secondary | ICD-10-CM | POA: Insufficient documentation

## 2016-05-01 MED ORDER — GADOBENATE DIMEGLUMINE 529 MG/ML IV SOLN
20.0000 mL | Freq: Once | INTRAVENOUS | Status: AC | PRN
  Administered 2016-05-01: 20 mL via INTRAVENOUS

## 2016-05-02 DIAGNOSIS — M1712 Unilateral primary osteoarthritis, left knee: Secondary | ICD-10-CM | POA: Diagnosis not present

## 2016-05-04 DIAGNOSIS — M1712 Unilateral primary osteoarthritis, left knee: Secondary | ICD-10-CM | POA: Diagnosis not present

## 2016-05-06 DIAGNOSIS — Z96652 Presence of left artificial knee joint: Secondary | ICD-10-CM | POA: Diagnosis not present

## 2016-05-07 DIAGNOSIS — M1712 Unilateral primary osteoarthritis, left knee: Secondary | ICD-10-CM | POA: Diagnosis not present

## 2016-05-14 DIAGNOSIS — M1712 Unilateral primary osteoarthritis, left knee: Secondary | ICD-10-CM | POA: Diagnosis not present

## 2016-05-19 DIAGNOSIS — M1712 Unilateral primary osteoarthritis, left knee: Secondary | ICD-10-CM | POA: Diagnosis not present

## 2016-05-21 DIAGNOSIS — M1712 Unilateral primary osteoarthritis, left knee: Secondary | ICD-10-CM | POA: Diagnosis not present

## 2016-05-26 DIAGNOSIS — M1712 Unilateral primary osteoarthritis, left knee: Secondary | ICD-10-CM | POA: Diagnosis not present

## 2016-05-28 DIAGNOSIS — M1712 Unilateral primary osteoarthritis, left knee: Secondary | ICD-10-CM | POA: Diagnosis not present

## 2016-05-29 DIAGNOSIS — R972 Elevated prostate specific antigen [PSA]: Secondary | ICD-10-CM | POA: Diagnosis not present

## 2016-06-02 DIAGNOSIS — M1712 Unilateral primary osteoarthritis, left knee: Secondary | ICD-10-CM | POA: Diagnosis not present

## 2016-06-03 DIAGNOSIS — M1712 Unilateral primary osteoarthritis, left knee: Secondary | ICD-10-CM | POA: Diagnosis not present

## 2016-06-04 DIAGNOSIS — R3912 Poor urinary stream: Secondary | ICD-10-CM | POA: Diagnosis not present

## 2016-06-04 DIAGNOSIS — N5201 Erectile dysfunction due to arterial insufficiency: Secondary | ICD-10-CM | POA: Diagnosis not present

## 2016-06-04 DIAGNOSIS — N401 Enlarged prostate with lower urinary tract symptoms: Secondary | ICD-10-CM | POA: Diagnosis not present

## 2016-06-04 DIAGNOSIS — R972 Elevated prostate specific antigen [PSA]: Secondary | ICD-10-CM | POA: Diagnosis not present

## 2016-06-05 ENCOUNTER — Encounter: Payer: Self-pay | Admitting: Family Medicine

## 2016-09-04 DIAGNOSIS — M1712 Unilateral primary osteoarthritis, left knee: Secondary | ICD-10-CM | POA: Diagnosis not present

## 2016-10-08 ENCOUNTER — Other Ambulatory Visit: Payer: Self-pay | Admitting: *Deleted

## 2016-10-08 DIAGNOSIS — L309 Dermatitis, unspecified: Secondary | ICD-10-CM

## 2016-10-08 MED ORDER — NYSTATIN 100000 UNIT/GM EX CREA: TOPICAL_CREAM | Freq: Two times a day (BID) | CUTANEOUS | 1 refills | Status: DC

## 2016-10-08 NOTE — Telephone Encounter (Signed)
Fax from [facility].  RF request for nystatin LOV: 11/25/15 Next ov: None Last written: 11/25/15 #30 w/ 1RF  Please advise. Thanks.

## 2016-12-03 DIAGNOSIS — N401 Enlarged prostate with lower urinary tract symptoms: Secondary | ICD-10-CM | POA: Diagnosis not present

## 2016-12-03 LAB — PSA: PSA: 3.77

## 2016-12-09 DIAGNOSIS — R3912 Poor urinary stream: Secondary | ICD-10-CM | POA: Diagnosis not present

## 2016-12-09 DIAGNOSIS — N401 Enlarged prostate with lower urinary tract symptoms: Secondary | ICD-10-CM | POA: Diagnosis not present

## 2016-12-09 DIAGNOSIS — R972 Elevated prostate specific antigen [PSA]: Secondary | ICD-10-CM | POA: Diagnosis not present

## 2016-12-14 ENCOUNTER — Encounter: Payer: Self-pay | Admitting: Family Medicine

## 2017-03-10 DIAGNOSIS — Z96652 Presence of left artificial knee joint: Secondary | ICD-10-CM | POA: Diagnosis not present

## 2017-03-10 DIAGNOSIS — M1712 Unilateral primary osteoarthritis, left knee: Secondary | ICD-10-CM | POA: Diagnosis not present

## 2017-06-08 DIAGNOSIS — R972 Elevated prostate specific antigen [PSA]: Secondary | ICD-10-CM | POA: Diagnosis not present

## 2017-06-16 DIAGNOSIS — N401 Enlarged prostate with lower urinary tract symptoms: Secondary | ICD-10-CM | POA: Diagnosis not present

## 2017-06-16 DIAGNOSIS — R972 Elevated prostate specific antigen [PSA]: Secondary | ICD-10-CM | POA: Diagnosis not present

## 2017-06-16 DIAGNOSIS — R3912 Poor urinary stream: Secondary | ICD-10-CM | POA: Diagnosis not present

## 2017-06-20 ENCOUNTER — Encounter: Payer: Self-pay | Admitting: Family Medicine

## 2017-07-01 IMAGING — MR MR PROSTATE WO/W CM
23 of 56 series · 23 of 56 positions shown · IV contrast (multihance)
Comparison: Prior biopsy report of 01/07/2015

CLINICAL DATA: Elevated PSA.

EXAM:
MR PROSTATE WITHOUT AND WITH CONTRAST
TECHNIQUE: Multiplanar multisequence MRI images were obtained of the pelvis
centered about the prostate. Pre and post contrast images were
obtained.
CONTRAST:  20mL MULTIHANCE GADOBENATE DIMEGLUMINE 529 MG/ML IV SOLN

[Series 3: bSSFP fat-sat · axial · 6.0mm · 0.86mm/px · 1 of 42 slices shown]
[im 1/42]
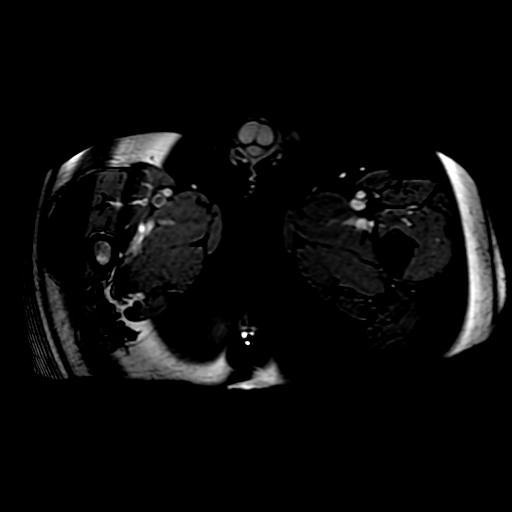

[Series 4: T1 · axial · 6.0mm · 0.86mm/px · 1 of 40 slices shown (1 of 2)]
[im 1/40]
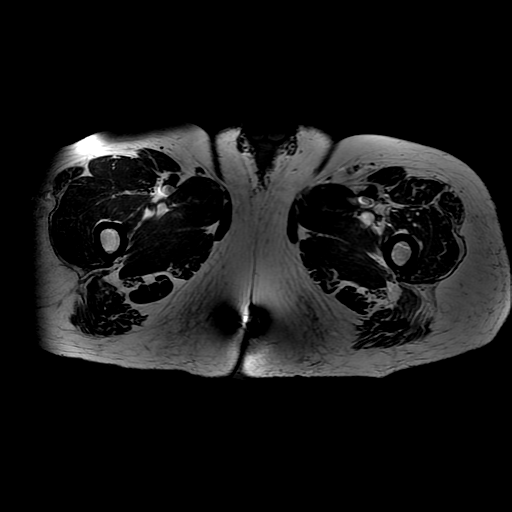

[Series 5: T2 · axial · 3.0mm · 0.29mm/px · 1 of 30 slices shown (1 of 4)]
[im 1/30]
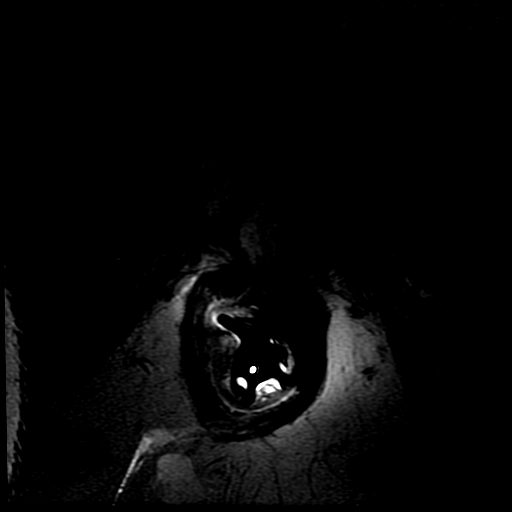

[Series 6: T1 · axial · 3.0mm · 0.29mm/px · 1 of 30 slices shown (2 of 2)]
[im 1/30]
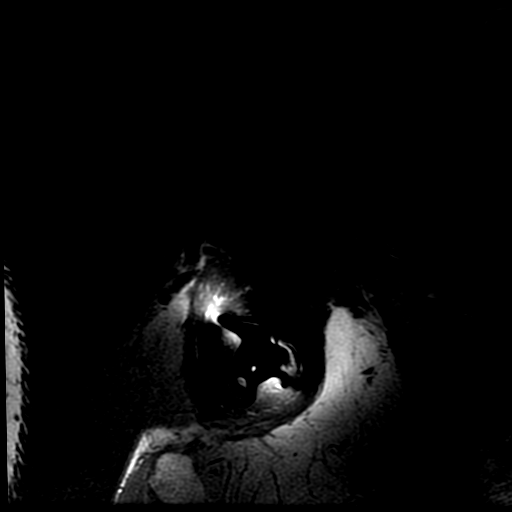

[Series 7: T2 · axial · 1.8mm · 0.47mm/px · 1 of 144 slices shown (2 of 4)]
[im 1/144]
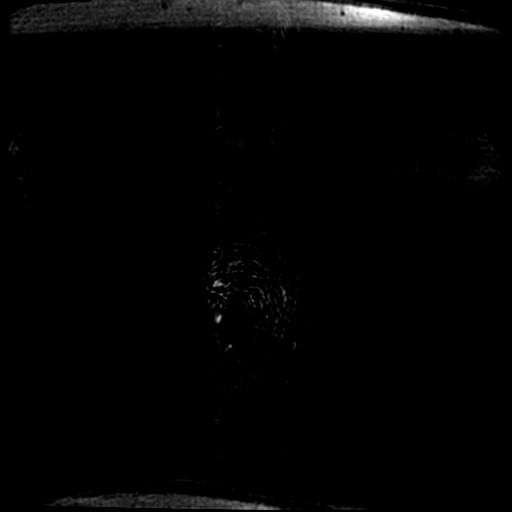

[Series 8: T2 · sagittal · 4.0mm · 0.29mm/px · 1 of 24 slices shown (3 of 4)]
[im 1/24]
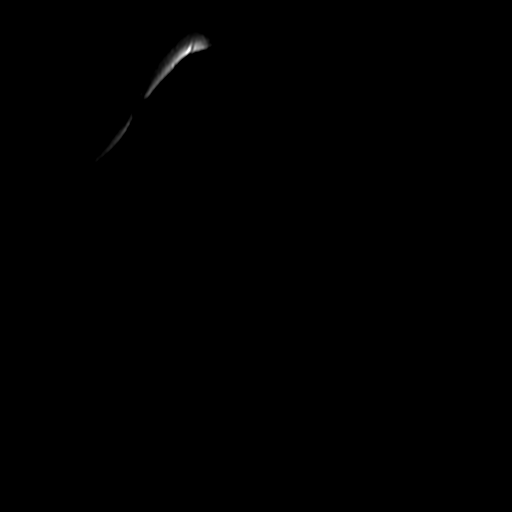

[Series 9: T2 · coronal · 4.0mm · 0.29mm/px · 1 of 21 slices shown (4 of 4)]
[im 1/21]
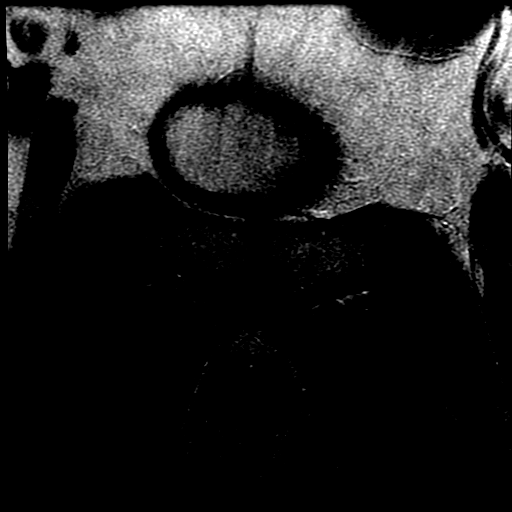

[Series 10: DWI · axial · 3.0mm · 0.59mm/px · 1 of 56 slices shown (1 of 6)]
[im 1/56]
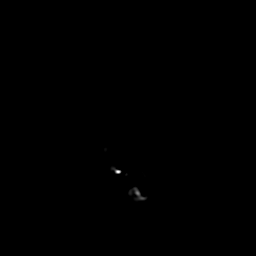

[Series 11: DWI · axial · 3.0mm · 0.59mm/px · 1 of 57 slices shown (2 of 6)]
[im 1/57]
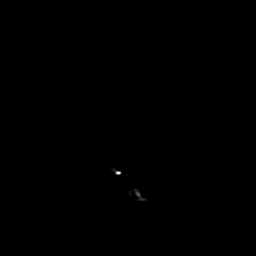

[Series 12: DWI · axial · 3.0mm · 0.59mm/px · 1 of 57 slices shown (3 of 6)]
[im 1/57]
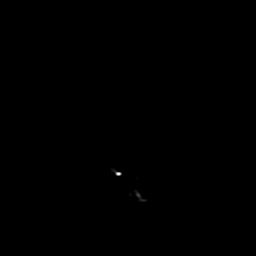

[Series 700: reformatted · axial · 1.8mm · 0.47mm/px · 1 of 46 slices shown (1 of 2)]
[im 1/46]
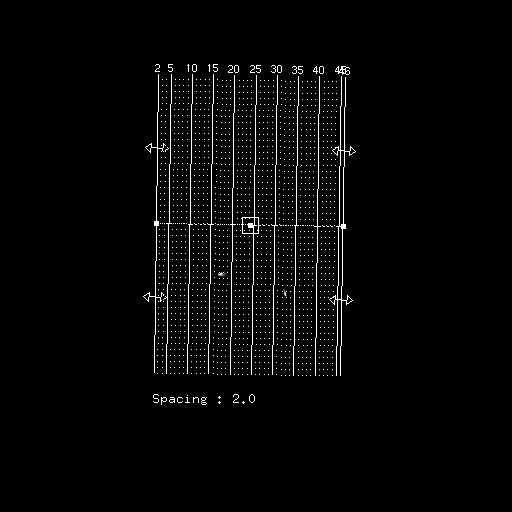

[Series 701: reformatted · axial · 1.8mm · 0.47mm/px · 1 of 46 slices shown (2 of 2)]
[im 1/46]
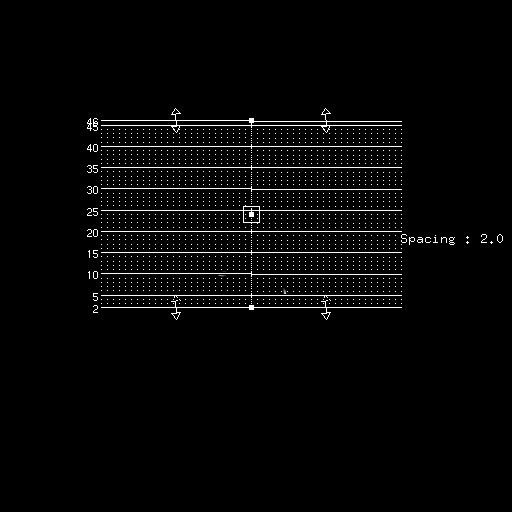

[Series 1000: DWI · axial · 3.0mm · 0.59mm/px · 1 of 30 slices shown (4 of 6)]
[im 1/30]
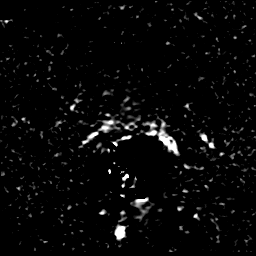

[Series 1100: DWI · axial · 3.0mm · 0.59mm/px · 1 of 30 slices shown (5 of 6)]
[im 1/30]
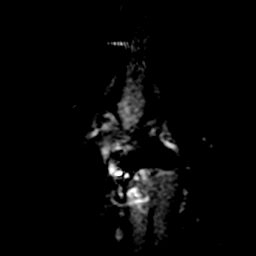

[Series 1200: DWI · axial · 3.0mm · 0.59mm/px · 1 of 30 slices shown (6 of 6)]
[im 1/30]
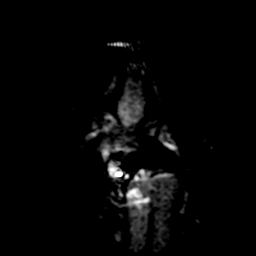

[((id)/(id)/1)-((id)/(id)/1) · axial · 3.0mm · 0.43mm/px · 1 of 76 slices shown (1 of 8)]
[im 1/76]
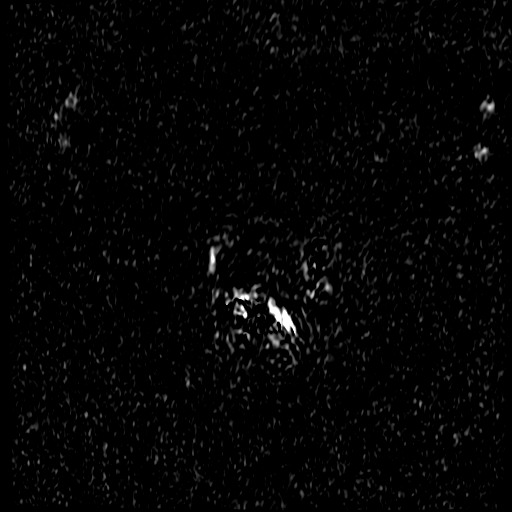

[((id)/(id)/1)-((id)/(id)/1) · axial · 3.0mm · 0.43mm/px · 1 of 74 slices shown (2 of 8)]
[im 1/74]
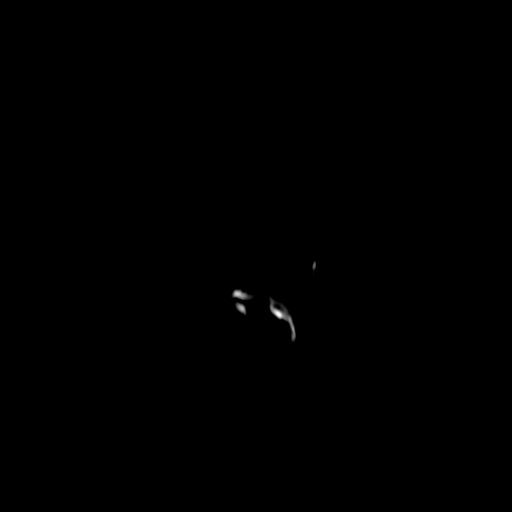

[((id)/(id)/1)-((id)/(id)/1) · axial · 3.0mm · 0.43mm/px · 1 of 75 slices shown (3 of 8)]
[im 1/75]
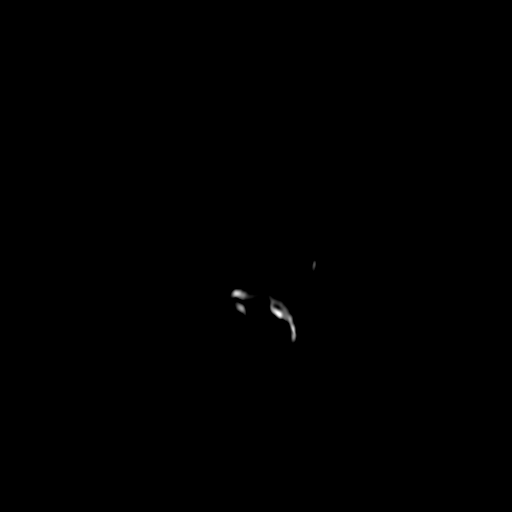

[((id)/(id)/1)-((id)/(id)/1) · axial · 3.0mm · 0.43mm/px · 1 of 75 slices shown (4 of 8)]
[im 1/75]
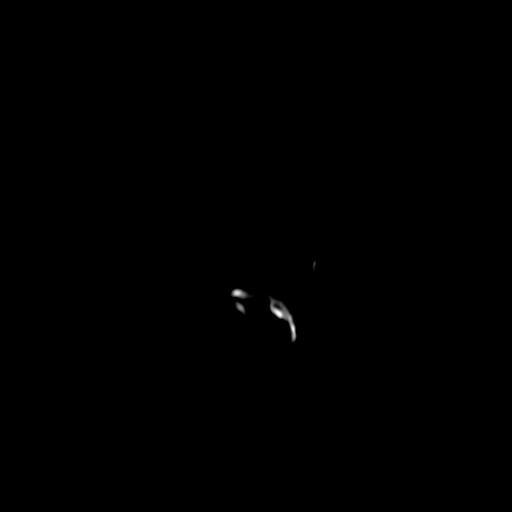

[((id)/(id)/1)-((id)/(id)/1) · axial · 3.0mm · 0.43mm/px · 1 of 76 slices shown (5 of 8)]
[im 1/76]
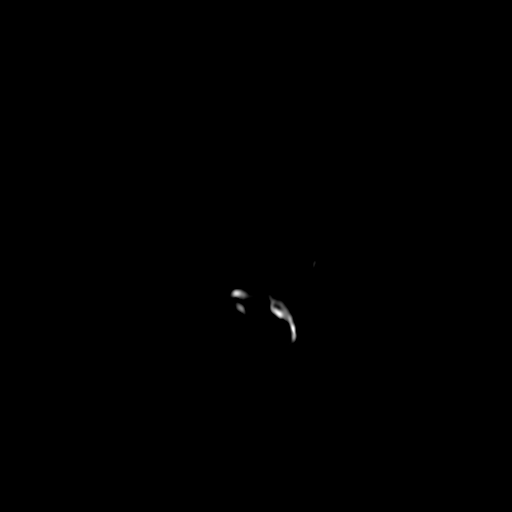

[((id)/(id)/1)-((id)/(id)/1) · axial · 3.0mm · 0.43mm/px · 1 of 76 slices shown (6 of 8)]
[im 1/76]
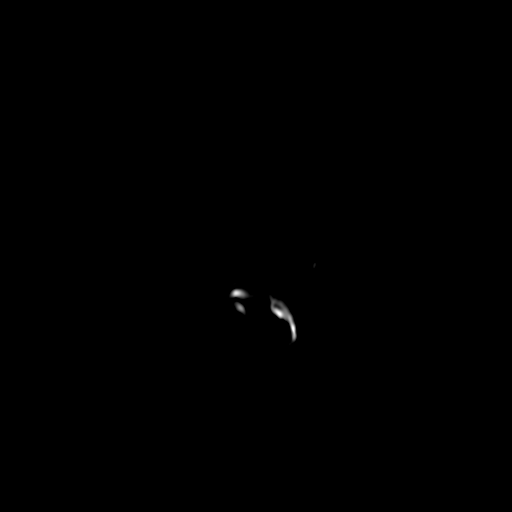

[((id)/(id)/1)-((id)/(id)/1) · axial · 3.0mm · 0.43mm/px · 1 of 76 slices shown (7 of 8)]
[im 1/76]
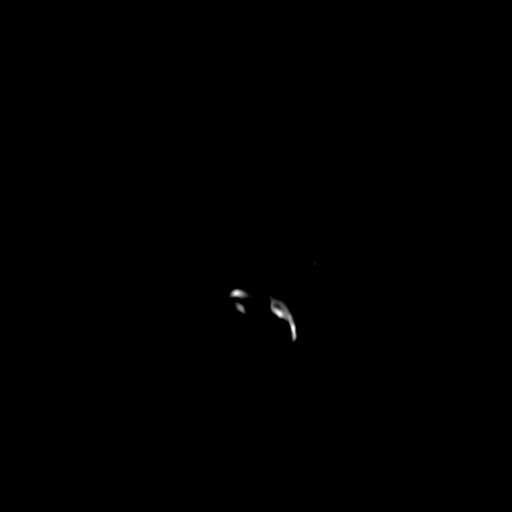

[((id)/(id)/1)-((id)/(id)/1) · axial · 3.0mm · 0.43mm/px · 1 of 76 slices shown (8 of 8)]
[im 1/76]
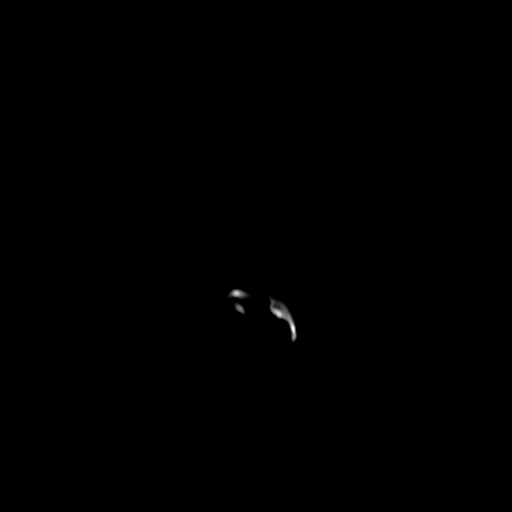

[23 of 56 positions shown; findings below may reference images not displayed]

FINDINGS: Prostate: Moderate central gland enlargement and heterogeneity
consistent with benign prostatic hyperplasia. No areas of peripheral
zone T2 hypointensity to suggest carcinoma. No restricted diffusion
or early post-contrast enhancement.

Transcapsular spread:  Absent

Seminal vesicle involvement: Absent

Neurovascular bundle involvement: Absent

Pelvic adenopathy: Absent

Bone metastasis: Absent

Other findings: No significant free fluid. Tiny fat containing left
inguinal hernia. Scattered colonic diverticula. Normal urinary
bladder.
IMPRESSION: 1. No evidence of macroscopic or high-grade prostate carcinoma.
2. Moderate benign prostatic hyperplasia.
3. Tiny fat containing left inguinal hernia.

## 2017-09-13 DIAGNOSIS — R972 Elevated prostate specific antigen [PSA]: Secondary | ICD-10-CM | POA: Diagnosis not present

## 2017-11-22 DIAGNOSIS — F909 Attention-deficit hyperactivity disorder, unspecified type: Secondary | ICD-10-CM

## 2017-11-22 HISTORY — DX: Attention-deficit hyperactivity disorder, unspecified type: F90.9

## 2017-11-24 DIAGNOSIS — D225 Melanocytic nevi of trunk: Secondary | ICD-10-CM | POA: Diagnosis not present

## 2017-11-24 DIAGNOSIS — L700 Acne vulgaris: Secondary | ICD-10-CM | POA: Diagnosis not present

## 2017-11-24 DIAGNOSIS — Z79899 Other long term (current) drug therapy: Secondary | ICD-10-CM | POA: Diagnosis not present

## 2017-11-24 DIAGNOSIS — L738 Other specified follicular disorders: Secondary | ICD-10-CM | POA: Diagnosis not present

## 2017-12-01 ENCOUNTER — Encounter: Payer: Self-pay | Admitting: Family Medicine

## 2017-12-02 ENCOUNTER — Ambulatory Visit (INDEPENDENT_AMBULATORY_CARE_PROVIDER_SITE_OTHER): Payer: BLUE CROSS/BLUE SHIELD | Admitting: Family Medicine

## 2017-12-02 ENCOUNTER — Encounter: Payer: Self-pay | Admitting: Family Medicine

## 2017-12-02 VITALS — BP 136/75 | HR 78 | Temp 98.5°F | Resp 16 | Ht 72.5 in | Wt 278.4 lb

## 2017-12-02 DIAGNOSIS — Z0000 Encounter for general adult medical examination without abnormal findings: Secondary | ICD-10-CM

## 2017-12-02 DIAGNOSIS — Z1211 Encounter for screening for malignant neoplasm of colon: Secondary | ICD-10-CM

## 2017-12-02 DIAGNOSIS — L304 Erythema intertrigo: Secondary | ICD-10-CM | POA: Diagnosis not present

## 2017-12-02 DIAGNOSIS — R41840 Attention and concentration deficit: Secondary | ICD-10-CM | POA: Diagnosis not present

## 2017-12-02 DIAGNOSIS — Z86010 Personal history of colonic polyps: Secondary | ICD-10-CM

## 2017-12-02 DIAGNOSIS — Z23 Encounter for immunization: Secondary | ICD-10-CM | POA: Diagnosis not present

## 2017-12-02 LAB — CBC WITH DIFFERENTIAL/PLATELET
Basophils Absolute: 0 10*3/uL (ref 0.0–0.1)
Basophils Relative: 0.5 % (ref 0.0–3.0)
Eosinophils Absolute: 0.2 10*3/uL (ref 0.0–0.7)
Eosinophils Relative: 2.4 % (ref 0.0–5.0)
HCT: 44.3 % (ref 39.0–52.0)
Hemoglobin: 15.2 g/dL (ref 13.0–17.0)
Lymphocytes Relative: 15.8 % (ref 12.0–46.0)
Lymphs Abs: 1.2 10*3/uL (ref 0.7–4.0)
MCHC: 34.2 g/dL (ref 30.0–36.0)
MCV: 87.7 fl (ref 78.0–100.0)
Monocytes Absolute: 0.6 10*3/uL (ref 0.1–1.0)
Monocytes Relative: 7.3 % (ref 3.0–12.0)
Neutro Abs: 5.7 10*3/uL (ref 1.4–7.7)
Neutrophils Relative %: 74 % (ref 43.0–77.0)
Platelets: 296 10*3/uL (ref 150.0–400.0)
RBC: 5.06 Mil/uL (ref 4.22–5.81)
RDW: 14.4 % (ref 11.5–15.5)
WBC: 7.6 10*3/uL (ref 4.0–10.5)

## 2017-12-02 LAB — COMPREHENSIVE METABOLIC PANEL
ALT: 31 U/L (ref 0–53)
AST: 23 U/L (ref 0–37)
Albumin: 4.5 g/dL (ref 3.5–5.2)
Alkaline Phosphatase: 68 U/L (ref 39–117)
BUN: 16 mg/dL (ref 6–23)
CO2: 27 mEq/L (ref 19–32)
Calcium: 9.3 mg/dL (ref 8.4–10.5)
Chloride: 105 mEq/L (ref 96–112)
Creatinine, Ser: 0.93 mg/dL (ref 0.40–1.50)
GFR: 88.09 mL/min (ref >60.00)
Glucose, Bld: 88 mg/dL (ref 70–99)
Potassium: 4.6 mEq/L (ref 3.5–5.1)
Sodium: 139 mEq/L (ref 135–145)
Total Bilirubin: 0.5 mg/dL (ref 0.2–1.2)
Total Protein: 6.9 g/dL (ref 6.0–8.3)

## 2017-12-02 LAB — LIPID PANEL
Cholesterol: 176 mg/dL (ref 0–200)
HDL: 44.9 mg/dL (ref >39.00)
LDL Cholesterol: 106 mg/dL — ABNORMAL HIGH (ref 0–99)
NonHDL: 131.41
Total CHOL/HDL Ratio: 4
Triglycerides: 127 mg/dL (ref 0.0–149.0)
VLDL: 25.4 mg/dL (ref 0.0–40.0)

## 2017-12-02 LAB — TSH: TSH: 3.15 u[IU]/mL (ref 0.35–4.50)

## 2017-12-02 MED ORDER — CLOTRIMAZOLE-BETAMETHASONE 1-0.05 % EX CREA: 1.0000 "application " | TOPICAL_CREAM | Freq: Two times a day (BID) | CUTANEOUS | 1 refills | Status: DC

## 2017-12-02 NOTE — Progress Notes (Signed)
Office Note 12/02/2017  CC:  Chief Complaint  Patient presents with  . Annual Exam    Pt is fasting.     HPI:  Carlos Ellis is a 62 y.o. male who is here for annual health maintenance exam.  Exercise: none.  He joined a GYM but hasn't started going yet.  Work overwhelming lately. Diet: decreasing portion size.  Has rash in between butt cheeks: itchy and red, foul odor.  Chronic.  Waxes and wanes in intensity.  Reports impaired focus/concentration: trouble sitting through long meetings at work and retaining info compared to "10 years ago".  When asked about possible depression he does speak of some significant problems with his 18 y/o son---he has struggled with dep/anx, not staying in school, recent suicide attempt. Pt asks for ADHD med today, says his wife got some and it helps her.  Past Medical History:  Diagnosis Date  . Acne    Dr. Katrinka Blazing (was on accutane at one point)  . Borderline hyperlipidemia   . BPH with obstruction/lower urinary tract symptoms 2015   finasteride trial 09/2015  . Bronchitis 10/06/2011  . Depression    Zoloft 2005  . Elevated blood pressure reading without diagnosis of hypertension   . Elevated PSA 2015   Pt saw Urol 08/2014: prostate bx 12/2014 showed high grade prostate intraepithelial neoplasia on 2 of 12 samples.  Urol following PSA closely (04/13/16 6.45, rising slightly so urology did a prostate MRI and this was normal).  PSA 4.18 Jun 2016 at [facility] f/u.  PSA 12/03/16=3.77.  PSA up 5.41 on 06/08/17--likely due to d/c of finasteride.  Cont monit PSA.  Marland Kitchen Episcleritis of right eye    w/mild scleritis right eye ([facility] opht 2011); also with hx of ocular varicella at age 27.  Marland Kitchen GERD (gastroesophageal reflux disease)    Tums  . Gout   . Hypogonadism male 2009  . Nephrolithiasis   . Osteoarthritis of left knee    Dr. Turner Daniels    Past Surgical History:  Procedure Laterality Date  . COLONOSCOPY     X 2, normal (for strong FH of colon cancer.  Last in  2009 was normal.  Repeat 10 yrs ([facility]).  Marland Kitchen FRACTURE SURGERY Left   . KNEE ARTHROSCOPY     Left: 1990s and early 2000's.  Marland Kitchen LITHOTRIPSY     1990s  . ROTATOR CUFF REPAIR     Right  . THROAT SURGERY     Infected branchial cleft cyst 2007  . TOTAL KNEE ARTHROPLASTY Left 03/10/2016   Procedure: TOTAL KNEE ARTHROPLASTY;  Surgeon: Marcene Corning, MD;  Location: [facility] OR;  Service: Orthopedics;  Laterality: Left;    History reviewed. No pertinent family history.  Social History   Socioeconomic History  . Marital status: Married    Spouse name: Not on file  . Number of children: Not on file  . Years of education: Not on file  . Highest education level: Not on file  Occupational History  . Not on file  Social Needs  . Financial resource strain: Not on file  . Food insecurity:    Worry: Not on file    Inability: Not on file  . Transportation needs:    Medical: Not on file    Non-medical: Not on file  Tobacco Use  . Smoking status: Never Smoker  . Smokeless tobacco: Never Used  Substance and Sexual Activity  . Alcohol use: Yes    Alcohol/week: 1.8 oz    Types: 3  Glasses of wine per week  . Drug use: No  . Sexual activity: Not on file  Lifestyle  . Physical activity:    Days per week: Not on file    Minutes per session: Not on file  . Stress: Not on file  Relationships  . Social connections:    Talks on phone: Not on file    Gets together: Not on file    Attends religious service: Not on file    Active member of club or organization: Not on file    Attends meetings of clubs or organizations: Not on file    Relationship status: Not on file  . Intimate partner violence:    Fear of current or ex partner: Not on file    Emotionally abused: Not on file    Physically abused: Not on file    Forced sexual activity: Not on file  Other Topics Concern  . Not on file  Social History Narrative   Married, 2 teenage children.   Relocated to [place] from [place] 2007.   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   No tobacco.  Drinks 2-3 glasses of wine per week.  No drugs.   Walks his dog about 1 mile per day.    Outpatient Medications Prior to Visit  Medication Sig Dispense Refill  . CLARAVIS 40 MG capsule Take 1 capsule by mouth 2 (two) times daily.  0  . finasteride (PROSCAR) 5 MG tablet Take 5 mg by mouth daily.  11  . NAPROXEN PO Take 1 tablet by mouth 2 (two) times daily.    Marland Kitchen aspirin EC 325 MG EC tablet Take 1 tablet (325 mg total) by mouth 2 (two) times daily after a meal. (Patient not taking: Reported on 12/02/2017) 30 tablet 0  . Bisacodyl (DULCOLAX PO) Take 1 tablet by mouth as needed (was directed to take before surgery).    Tery Sanfilippo Sodium (COLACE PO) Take 1 tablet by mouth daily.    Marland Kitchen HYDROcodone-acetaminophen (NORCO/VICODIN) 5-325 MG tablet Take 1-2 tablets by mouth every 4 (four) hours as needed for moderate pain. (Patient not taking: Reported on 12/02/2017) 50 tablet 0  . methocarbamol (ROBAXIN) 500 MG tablet Take 1 tablet (500 mg total) by mouth every 6 (six) hours as needed for muscle spasms. (Patient not taking: Reported on 12/02/2017) 50 tablet 0   No facility-administered medications prior to visit.     Allergies  Allergen Reactions  . Sulfa Antibiotics Rash    ROS Review of Systems  Constitutional: Negative for appetite change, chills, fatigue and fever.  HENT: Negative for congestion, dental problem, ear pain and sore throat.   Eyes: Negative for discharge, redness and visual disturbance.  Respiratory: Negative for cough, chest tightness, shortness of breath and wheezing.   Cardiovascular: Negative for chest pain, palpitations and leg swelling.  Gastrointestinal: Negative for abdominal pain, blood in stool, diarrhea, nausea and vomiting.  Genitourinary: Negative for difficulty urinating, dysuria, flank pain, frequency, hematuria and urgency.  Musculoskeletal: Negative for  arthralgias, back pain, joint swelling, myalgias and neck stiffness.  Skin: Positive for rash (gluteal cleft). Negative for pallor.  Neurological: Negative for dizziness, speech difficulty, weakness and headaches.  Hematological: Negative for adenopathy. Does not bruise/bleed easily.  Psychiatric/Behavioral: Positive for decreased concentration (see HPI). Negative for confusion and sleep disturbance. The patient is not nervous/anxious.     PE; Blood pressure 136/75, pulse 78, temperature 98.5 F (36.9 C), temperature  source Oral, resp. rate 16, height 6' 0.5" (1.842 m), weight 278 lb 6 oz (126.3 kg), SpO2 96 %. Gen: Alert, well appearing.  Patient is oriented to person, place, time, and situation. AFFECT: pleasant, lucid thought and speech. ENT: Ears: EACs clear, normal epithelium.  TMs with good light reflex and landmarks bilaterally.  Eyes: no injection, icteris, swelling, or exudate.  EOMI, PERRLA. Nose: no drainage or turbinate edema/swelling.  No injection or focal lesion.  Mouth: lips without lesion/swelling.  Oral mucosa pink and moist.  Dentition intact and without obvious caries or gingival swelling.  Oropharynx without erythema, exudate, or swelling.  Neck: supple/nontender.  No LAD, mass, or TM.  Carotid pulses 2+ bilaterally, without bruits. CV: RRR, no m/r/g.   LUNGS: CTA bilat, nonlabored resps, good aeration in all lung fields. ABD: soft, NT, ND, BS normal.  No hepatospenomegaly or mass.  No bruits. EXT: no clubbing, cyanosis, or edema.  Musculoskeletal: no joint swelling, erythema, warmth, or tenderness.  ROM of all joints intact. Skin - no sores or suspicious lesions.  He has an erythematous macular rash in gluteal cleft, mild superficial desquamation present.  Borders of rash very well demarcated.  Pertinent labs:  Lab Results  Component Value Date   TSH 2.80 08/30/2015   Lab Results  Component Value Date   WBC 8.5 02/28/2016   HGB 14.9 02/28/2016   HCT 43.6  02/28/2016   MCV 86.9 02/28/2016   PLT 256 02/28/2016   Lab Results  Component Value Date   CREATININE 0.90 02/28/2016   BUN 14 02/28/2016   NA 138 02/28/2016   K 3.7 02/28/2016   CL 106 02/28/2016   CO2 24 02/28/2016   Lab Results  Component Value Date   ALT 31 08/30/2015   AST 22 08/30/2015   ALKPHOS 62 08/30/2015   BILITOT 0.5 08/30/2015   Lab Results  Component Value Date   CHOL 179 08/30/2015   Lab Results  Component Value Date   HDL 51.50 08/30/2015   Lab Results  Component Value Date   LDLCALC 112 (H) 08/30/2015   Lab Results  Component Value Date   TRIG 80.0 08/30/2015   Lab Results  Component Value Date   CHOLHDL 3 08/30/2015   Lab Results  Component Value Date   PSA 3.77 12/03/2016   PSA 3.65 10/13/2013     ASSESSMENT AND PLAN:   1) Intertrigo, gluteal cleft, chronic. Will rx lotrisone cream, apply bid prn. If no improvement will change to nystatin cream for exclusive tx of candida.  2) Attention/focus problems: this is mostly tied to feeling overworked/overwhelmed at work. He does say that when work is not as demanding his concentration still drifts elsewhere. I told him that the best way to approach these sx's, esp since he has had psychosocial/family (his son) issues to process, is to return for a 30 min appt dedicated to evaluation of these complaints.  No med rx'd today.  3)) Health maintenance exam: Reviewed age and gender appropriate health maintenance issues (prudent diet, regular exercise, health risks of tobacco and excessive alcohol, use of seatbelts, fire alarms in home, use of sunscreen).  Also reviewed age and gender appropriate health screening as well as vaccine recommendations. Vaccines: UTD.   Labs: fasting HP.  Shingrix discussed--he'll call back to check availability in future.  Flu vaccine today. Prostate ca screening: hx of elevated PSA--followed by urol. Colon ca screening: due for repeat this year.  [facility], but he  asks for referral to [facility] at this time---referral ordered.  An After Visit Summary was printed and given to the patient.  FOLLOW UP:  Return for 30 min appt at pt's earliest convenience to discuss concentration/focus probs.  Signed:  Santiago Bumpers, MD           12/02/2017

## 2017-12-02 NOTE — Patient Instructions (Signed)

## 2017-12-17 ENCOUNTER — Ambulatory Visit: Payer: BLUE CROSS/BLUE SHIELD | Admitting: Family Medicine

## 2017-12-17 ENCOUNTER — Encounter: Payer: Self-pay | Admitting: Family Medicine

## 2017-12-17 VITALS — BP 145/88 | HR 78 | Temp 98.6°F | Resp 16 | Ht 72.5 in | Wt 278.5 lb

## 2017-12-17 DIAGNOSIS — F988 Other specified behavioral and emotional disorders with onset usually occurring in childhood and adolescence: Secondary | ICD-10-CM

## 2017-12-17 DIAGNOSIS — Z23 Encounter for immunization: Secondary | ICD-10-CM

## 2017-12-17 MED ORDER — METHYLPHENIDATE HCL ER 27 MG PO TB24: ORAL_TABLET | ORAL | 0 refills | Status: DC

## 2017-12-17 NOTE — Progress Notes (Signed)
OFFICE VISIT  12/17/2017   CC:  Chief Complaint  Patient presents with  . Concentration/Focus   HPI:    Patient is a 62 y.o. Caucasian male who presents for discussion of pt's concern of poor concentration and focus. He is on isotretinoin for acne. Hx of depression but on no meds at current time.  The last 1 yr or so (but tendencies towards these sx's all his life) , much more difficulties with focusing at work, distracted easily, can't complete tasks---esp a problem at work. Leads to frustration.  Procrastination some, frustration easily.  Some forgetfulness.  He does feel internally like he is restless, always having to be doing something.  He is able to destress w/out much problem. Denies depressed mood.  No overwhelming anxiety, no panic attacks. Falls asleep "in a heartbeat".  Minimal snoring.  No witness apneic events in sleep.  Past hx of NORMAL sleep study.  No correlation with onset of these sx's with starting a medication.  ROS: no palpitations, no HAs, no abd pains, no dry mouth, no dizziness, no tremors  Past Medical History:  Diagnosis Date  . Acne    Dr. Katrinka Blazing (was on accutane at one point)  . Borderline hyperlipidemia   . BPH with obstruction/lower urinary tract symptoms 2015   finasteride trial 09/2015  . Bronchitis 10/06/2011  . Depression    Zoloft 2005  . Elevated blood pressure reading without diagnosis of hypertension   . Elevated PSA 2015   Pt saw Urol 08/2014: prostate bx 12/2014 showed high grade prostate intraepithelial neoplasia on 2 of 12 samples.  Urol following PSA closely (04/13/16 6.45, rising slightly so urology did a prostate MRI and this was normal).  PSA 4.18 Jun 2016 at [facility] f/u.  PSA 12/03/16=3.77.  PSA up 5.41 on 06/08/17--likely due to d/c of finasteride.  Cont monit PSA.  Marland Kitchen Episcleritis of right eye    w/mild scleritis right eye ([facility] opht 2011); also with hx of ocular varicella at age 81.  Marland Kitchen GERD (gastroesophageal reflux disease)    Tums  .  Gout   . Hypogonadism male 2009  . Nephrolithiasis   . Osteoarthritis of left knee    Dr. Turner Daniels    Past Surgical History:  Procedure Laterality Date  . COLONOSCOPY     X 2, normal (for strong FH of colon cancer.  Last in 2009 was normal.  Repeat 10 yrs ([facility]).  Marland Kitchen FRACTURE SURGERY Left   . KNEE ARTHROSCOPY     Left: 1990s and early 2000's.  Marland Kitchen LITHOTRIPSY     1990s  . ROTATOR CUFF REPAIR     Right  . THROAT SURGERY     Infected branchial cleft cyst 2007  . TOTAL KNEE ARTHROPLASTY Left 03/10/2016   Procedure: TOTAL KNEE ARTHROPLASTY;  Surgeon: Marcene Corning, MD;  Location: [facility] OR;  Service: Orthopedics;  Laterality: Left;    Outpatient Medications Prior to Visit  Medication Sig Dispense Refill  . CLARAVIS 40 MG capsule Take 1 capsule by mouth 2 (two) times daily.  0  . clotrimazole-betamethasone (LOTRISONE) cream Apply 1 application topically 2 (two) times daily. 30 g 1  . finasteride (PROSCAR) 5 MG tablet Take 5 mg by mouth daily.  11  . NAPROXEN PO Take 1 tablet by mouth 2 (two) times daily.     No facility-administered medications prior to visit.     Allergies  Allergen Reactions  . Sulfa Antibiotics Rash    ROS As per HPI  PE:  Blood pressure (!) 145/88, pulse 78, temperature 98.6 F (37 C), temperature source Oral, resp. rate 16, height 6' 0.5" (1.842 m), weight 278 lb 8 oz (126.3 kg), SpO2 95 %. Wt Readings from Last 2 Encounters:  12/17/17 278 lb 8 oz (126.3 kg)  12/02/17 278 lb 6 oz (126.3 kg)    Gen: alert, oriented x 4, affect pleasant.  Lucid thinking and conversation noted. HEENT: PERRLA, EOMI.   Neck: no LAD, mass, or thyromegaly. CV: RRR, no m/r/g LUNGS: CTA bilat, nonlabored. NEURO: no tremor or tics noted on observation.  Coordination intact. CN 2-12 grossly intact bilaterally, strength 5/5 in all extremeties.  No ataxia.   LABS:    Chemistry      Component Value Date/Time   NA 139 12/02/2017 0856   K 4.6 12/02/2017 0856   CL 105  12/02/2017 0856   CO2 27 12/02/2017 0856   BUN 16 12/02/2017 0856   CREATININE 0.93 12/02/2017 0856      Component Value Date/Time   CALCIUM 9.3 12/02/2017 0856   ALKPHOS 68 12/02/2017 0856   AST 23 12/02/2017 0856   ALT 31 12/02/2017 0856   BILITOT 0.5 12/02/2017 0856       IMPRESSION AND PLAN:  Adult ADD: new dx. Discussed dx, educated pt on nonmed mgmt as well as med options. I recommended stimulant--long acting.  He was agreeable to this plan. Started generic concerta 27 mg qAM, #30. Discussed the fact that stimulants are controlled substances and that once we get him on appropr dose I'll get him to sign a controlled substance contract.  Prev health care: shingrix #1 given today.  FOLLOW UP: Return in about 1 month (around 01/14/2018) for f/u adult add.  Signed:  Santiago BumpersPhil Jasenia Weilbacher, MD           12/17/2017

## 2018-01-05 DIAGNOSIS — K130 Diseases of lips: Secondary | ICD-10-CM | POA: Diagnosis not present

## 2018-01-05 DIAGNOSIS — L700 Acne vulgaris: Secondary | ICD-10-CM | POA: Diagnosis not present

## 2018-01-05 DIAGNOSIS — Z79899 Other long term (current) drug therapy: Secondary | ICD-10-CM | POA: Diagnosis not present

## 2018-01-12 ENCOUNTER — Ambulatory Visit: Payer: BLUE CROSS/BLUE SHIELD | Admitting: Family Medicine

## 2018-01-12 ENCOUNTER — Encounter: Payer: Self-pay | Admitting: Family Medicine

## 2018-01-12 VITALS — BP 124/78 | HR 81 | Temp 97.8°F | Resp 16 | Ht 72.5 in | Wt 270.0 lb

## 2018-01-12 DIAGNOSIS — F988 Other specified behavioral and emotional disorders with onset usually occurring in childhood and adolescence: Secondary | ICD-10-CM | POA: Diagnosis not present

## 2018-01-12 MED ORDER — METHYLPHENIDATE HCL ER (OSM) 54 MG PO TBCR: 54.0000 mg | EXTENDED_RELEASE_TABLET | ORAL | 0 refills | Status: DC

## 2018-01-12 NOTE — Progress Notes (Signed)
OFFICE VISIT  01/12/2018   CC:  Chief Complaint  Patient presents with  . Follow-up    ADD   HPI:    Patient is a 62 y.o. Caucasian male who presents for 1 mo f/u adult ADHD--new dx 1 mo ago. Started generic concerta  qAM last visit. He has noted some improvement: feeling more attentive and engaged, even in the afternoons. Denies any side effects. Improved focus, concentration, task completion.  Less frustration, better multitasking, less impulsivity and restlessness.  Mood is stable. He does say he has significant room for improvement and is in favor of dose increase.  Med only lasting until about 3-4 in afternoon (takes it abour 7:30-8AM.   Past Medical History:  Diagnosis Date  . Acne    Dr. Katrinka Blazing (was on accutane at one point)  . Adult ADHD 11/2017  . Borderline hyperlipidemia   . BPH with obstruction/lower urinary tract symptoms 2015   finasteride trial 09/2015  . Bronchitis 10/06/2011  . Depression    Zoloft 2005  . Elevated blood pressure reading without diagnosis of hypertension   . Elevated PSA 2015   Pt saw Urol 08/2014: prostate bx 12/2014 showed high grade prostate intraepithelial neoplasia on 2 of 12 samples.  Urol following PSA closely (04/13/16 6.45, rising slightly so urology did a prostate MRI and this was normal).  PSA 4.18 Jun 2016 at [facility] f/u.  PSA 12/03/16=3.77.  PSA up 5.41 on 06/08/17--likely due to d/c of finasteride.  Cont monit PSA.  Marland Kitchen Episcleritis of right eye    w/mild scleritis right eye ([facility] opht 2011); also with hx of ocular varicella at age 50.  Marland Kitchen GERD (gastroesophageal reflux disease)    Tums  . Gout   . Hypogonadism male 2009  . Nephrolithiasis   . Osteoarthritis of left knee    Dr. Turner Daniels    Past Surgical History:  Procedure Laterality Date  . COLONOSCOPY     X 2, normal (for strong FH of colon cancer.  Last in 2009 was normal.  Repeat 10 yrs ([facility]).  Marland Kitchen FRACTURE SURGERY Left   . KNEE ARTHROSCOPY     Left: 1990s and early 2000's.   Marland Kitchen LITHOTRIPSY     1990s  . ROTATOR CUFF REPAIR     Right  . THROAT SURGERY     Infected branchial cleft cyst 2007  . TOTAL KNEE ARTHROPLASTY Left 03/10/2016   Procedure: TOTAL KNEE ARTHROPLASTY;  Surgeon: Marcene Corning, MD;  Location: [facility] OR;  Service: Orthopedics;  Laterality: Left;    Outpatient Medications Prior to Visit  Medication Sig Dispense Refill  . CLARAVIS 40 MG capsule Take 1 capsule by mouth 2 (two) times daily.  0  . finasteride (PROSCAR) 5 MG tablet Take 5 mg by mouth daily.  11  . NAPROXEN PO Take 1 tablet by mouth 2 (two) times daily.    . methylphenidate 27 MG PO TB24 1 tab po qAM 30 tablet 0  . clotrimazole-betamethasone (LOTRISONE) cream Apply 1 application topically 2 (two) times daily. (Patient not taking: Reported on 01/12/2018) 30 g 1   No facility-administered medications prior to visit.     Allergies  Allergen Reactions  . Sulfa Antibiotics Rash    ROS As per HPI  PE: Initial bp today was 150s/80s. Blood pressure 124/78, pulse 81, temperature 97.8 F (36.6 C), temperature source Oral, resp. rate 16, height 6' 0.5" (1.842 m), weight 270 lb (122.5 kg), SpO2 95 %. Wt Readings from Last 2 Encounters:  01/12/18 270 lb (122.5 kg)  12/17/17 278 lb 8 oz (126.3 kg)    Gen: alert, oriented x 4, affect pleasant.  Lucid thinking and conversation noted. HEENT: PERRLA, EOMI.   Neck: no LAD, mass, or thyromegaly. CV: RRR, no m/r/g LUNGS: CTA bilat, nonlabored. NEURO: no tremor or tics noted on observation.  Coordination intact. CN 2-12 grossly intact bilaterally, strength 5/5 in all extremeties.  No ataxia.   LABS:    Chemistry      Component Value Date/Time   NA 139 12/02/2017 0856   K 4.6 12/02/2017 0856   CL 105 12/02/2017 0856   CO2 27 12/02/2017 0856   BUN 16 12/02/2017 0856   CREATININE 0.93 12/02/2017 0856      Component Value Date/Time   CALCIUM 9.3 12/02/2017 0856   ALKPHOS 68 12/02/2017 0856   AST 23 12/02/2017 0856   ALT 31 12/02/2017  0856   BILITOT 0.5 12/02/2017 0856     Lab Results  Component Value Date   TSH 3.15 12/02/2017    IMPRESSION AND PLAN:  1) Adult ADHD:  Improved on generic concerta 27 mg qAM.  Not lasting more than 7-8 hours, though. Plan: increase concerta to 54 mg qAM, and hopefull we'll see longer duration of action as well as increased efficacy.  2) Elevated bp w/out dx HTN: repeat bp normal here today. I've asked him to check bp a few times at home or pharmacy in the next month and bring these in for review.  An After Visit Summary was printed and given to the patient.  FOLLOW UP: Return in about 1 month (around 02/09/2018) for f/u adult add.  Signed:  Santiago Bumpers, MD           01/12/2018

## 2018-01-14 ENCOUNTER — Ambulatory Visit: Payer: BLUE CROSS/BLUE SHIELD | Admitting: Family Medicine

## 2018-01-18 ENCOUNTER — Ambulatory Visit: Payer: BLUE CROSS/BLUE SHIELD | Admitting: Family Medicine

## 2018-02-11 ENCOUNTER — Ambulatory Visit: Payer: BLUE CROSS/BLUE SHIELD | Admitting: Family Medicine

## 2018-02-11 ENCOUNTER — Encounter: Payer: Self-pay | Admitting: Family Medicine

## 2018-02-11 VITALS — BP 135/84 | HR 73 | Temp 98.3°F | Resp 16 | Ht 72.5 in | Wt 272.5 lb

## 2018-02-11 DIAGNOSIS — F988 Other specified behavioral and emotional disorders with onset usually occurring in childhood and adolescence: Secondary | ICD-10-CM | POA: Diagnosis not present

## 2018-02-11 DIAGNOSIS — E669 Obesity, unspecified: Secondary | ICD-10-CM | POA: Diagnosis not present

## 2018-02-11 DIAGNOSIS — R030 Elevated blood-pressure reading, without diagnosis of hypertension: Secondary | ICD-10-CM

## 2018-02-11 MED ORDER — METHYLPHENIDATE HCL ER (OSM) 54 MG PO TBCR: 54.0000 mg | EXTENDED_RELEASE_TABLET | ORAL | 0 refills | Status: DC

## 2018-02-11 MED ORDER — METHYLPHENIDATE HCL 10 MG PO TABS: ORAL_TABLET | ORAL | 0 refills | Status: DC

## 2018-02-11 NOTE — Progress Notes (Signed)
OFFICE VISIT  02/11/2018   CC:  Chief Complaint  Patient presents with  . Follow-up    Adult ADD    HPI:    Patient is a 62 y.o. Caucasian male who presents for f/u 1 mo f/u adult ADD and elevated bp w/out hx of HTN. Last visit he reported moderately positive effects from recent start of concerta at 27 mg qd dosing.  Increased dose to 54mg  qd at that time. Feeling much better.  Pt states all is going well with the med at current dosing: much improved focus, concentration, task completion.  Less frustration, better multitasking, less impulsivity and restlessness.  Mood is stable. No side effects from the medication.  Elev bp w/out dx HTN: pt was to start monitoring bp at home regularly --> low 130s over high 70s, HR's not recalled by pt.  Past Medical History:  Diagnosis Date  . Acne    Dr. Katrinka BlazingSmith (was on accutane at one point)  . Adult ADHD 11/2017  . Borderline hyperlipidemia   . BPH with obstruction/lower urinary tract symptoms 2015   finasteride trial 09/2015  . Bronchitis 10/06/2011  . Depression    Zoloft 2005  . Elevated blood pressure reading without diagnosis of hypertension   . Elevated PSA 2015   Pt saw Urol 08/2014: prostate bx 12/2014 showed high grade prostate intraepithelial neoplasia on 2 of 12 samples.  Urol following PSA closely (04/13/16 6.45, rising slightly so urology did a prostate MRI and this was normal).  [facility] Jun [facility].  PSA 12/03/16=3.77.  PSA up 5.41 on 06/08/17--likely due to d/c of finasteride.  Cont monit PSA.  Marland Kitchen. Episcleritis of right eye    w/mild scleritis right eye ([facility] opht 2011); also with hx of ocular varicella at age 62.  Marland Kitchen. GERD (gastroesophageal reflux disease)    Tums  . Gout   . Hypogonadism male 2009  . Nephrolithiasis   . Osteoarthritis of left knee    Dr. Turner Danielsowan    Past Surgical History:  Procedure Laterality Date  . COLONOSCOPY     X 2, normal (for strong FH of colon cancer.  Last in 2009 was normal.  Repeat 10 yrs  ([facility]).  Marland Kitchen. FRACTURE SURGERY Left   . KNEE ARTHROSCOPY     Left: 1990s and early 2000's.  Marland Kitchen. LITHOTRIPSY     1990s  . ROTATOR CUFF REPAIR     Right  . THROAT SURGERY     Infected branchial cleft cyst 2007  . TOTAL KNEE ARTHROPLASTY Left 03/10/2016   Procedure: TOTAL KNEE ARTHROPLASTY;  Surgeon: Marcene CorningPeter Dalldorf, MD;  Location: [facility] OR;  Service: Orthopedics;  Laterality: Left;    Outpatient Medications Prior to Visit  Medication Sig Dispense Refill  . CLARAVIS 40 MG capsule Take 1 capsule by mouth 2 (two) times daily.  0  . finasteride (PROSCAR) 5 MG tablet Take 5 mg by mouth daily.  11  . NAPROXEN PO Take 1 tablet by mouth 2 (two) times daily.    . methylphenidate 54 MG PO CR tablet Take 1 tablet (54 mg total) by mouth every morning. 30 tablet 0  . clotrimazole-betamethasone (LOTRISONE) cream Apply 1 application topically 2 (two) times daily. (Patient not taking: Reported on 01/12/2018) 30 g 1   No facility-administered medications prior to visit.     Allergies  Allergen Reactions  . Sulfa Antibiotics Rash    ROS As per HPI  PE:  Blood pressure 135/84, pulse 73, temperature 98.3  F (36.8 C), temperature source Oral, resp. rate 16, height 6' 0.5" (1.842 m), weight 272 lb 8 oz (123.6 kg), SpO2 96 %. Body mass index is 36.45 kg/m.  Wt Readings from Last 2 Encounters:  02/11/18 272 lb 8 oz (123.6 kg)  01/12/18 270 lb (122.5 kg)    Gen: alert, oriented x 4, affect pleasant.  Lucid thinking and conversation noted. HEENT: PERRLA, EOMI.   Neck: no LAD, mass, or thyromegaly. CV: RRR, no m/r/g LUNGS: CTA bilat, nonlabored. NEURO: no tremor or tics noted on observation.  Coordination intact. CN 2-12 grossly intact bilaterally, strength 5/5 in all extremeties.  No ataxia.   LABS:    Chemistry      Component Value Date/Time   NA 139 12/02/2017 0856   K 4.6 12/02/2017 0856   CL 105 12/02/2017 0856   CO2 27 12/02/2017 0856   BUN 16 12/02/2017 0856   CREATININE 0.93  12/02/2017 0856      Component Value Date/Time   CALCIUM 9.3 12/02/2017 0856   ALKPHOS 68 12/02/2017 0856   AST 23 12/02/2017 0856   ALT 31 12/02/2017 0856   BILITOT 0.5 12/02/2017 0856      IMPRESSION AND PLAN:  Adult ADD: improved significantly but needs longer duration of stimulant action. Discussed---decided to continue concerta 54mg  qAM and add methylphenidate short acting 10mg  pill q afternoon prn. 30 of each of these meds rx'd today, printed today and handed to pt.  Elev bp w/out dx htn: continue home bp monitoring: no med at this time.  An After Visit Summary was printed and given to the patient.   FOLLOW UP: Return in about 1 month (around 03/11/2018) for f/u ADHD.  Signed:  Santiago Bumpers, MD           02/11/2018

## 2018-02-16 DIAGNOSIS — Z79899 Other long term (current) drug therapy: Secondary | ICD-10-CM | POA: Diagnosis not present

## 2018-02-16 DIAGNOSIS — L700 Acne vulgaris: Secondary | ICD-10-CM | POA: Diagnosis not present

## 2018-02-22 ENCOUNTER — Encounter: Payer: Self-pay | Admitting: Internal Medicine

## 2018-02-25 DIAGNOSIS — M1712 Unilateral primary osteoarthritis, left knee: Secondary | ICD-10-CM | POA: Diagnosis not present

## 2018-03-15 ENCOUNTER — Ambulatory Visit: Payer: BLUE CROSS/BLUE SHIELD | Admitting: Family Medicine

## 2018-03-17 DIAGNOSIS — R972 Elevated prostate specific antigen [PSA]: Secondary | ICD-10-CM | POA: Diagnosis not present

## 2018-03-18 ENCOUNTER — Encounter: Payer: Self-pay | Admitting: Family Medicine

## 2018-03-18 ENCOUNTER — Ambulatory Visit: Payer: BLUE CROSS/BLUE SHIELD | Admitting: Family Medicine

## 2018-03-18 VITALS — BP 143/78 | HR 80 | Temp 98.4°F | Resp 16 | Ht 72.5 in | Wt 269.0 lb

## 2018-03-18 DIAGNOSIS — F909 Attention-deficit hyperactivity disorder, unspecified type: Secondary | ICD-10-CM

## 2018-03-18 MED ORDER — METHYLPHENIDATE HCL ER (OSM) 54 MG PO TBCR: 54.0000 mg | EXTENDED_RELEASE_TABLET | ORAL | 0 refills | Status: DC

## 2018-03-18 MED ORDER — METHYLPHENIDATE HCL 10 MG PO TABS: ORAL_TABLET | ORAL | 0 refills | Status: DC

## 2018-03-18 NOTE — Progress Notes (Signed)
OFFICE VISIT  03/18/2018   CC:  Chief Complaint  Patient presents with  . Follow-up    ADHD   HPI:    Patient is a 62 y.o. Caucasian male who presents for 1 mo f/u adult ADD and recent elevated bp w/out dx htn. Last visit I made no changes to his med regimen.  His home bp monitoring was showing normal bp's.  Pt states all is going well with the med at current dosing: much improved focus, concentration, task completion.  Less frustration, better multitasking, less impulsivity and restlessness.  Mood is stable. No side effects from the medication.   Past Medical History:  Diagnosis Date  . Acne    Dr. Katrinka BlazingSmith (was on accutane at one point)  . Adult ADHD 11/2017  . Borderline hyperlipidemia   . BPH with obstruction/lower urinary tract symptoms 2015   finasteride trial 09/2015  . Bronchitis 10/06/2011  . Depression    Zoloft 2005  . Elevated blood pressure reading without diagnosis of hypertension   . Elevated PSA 2015   Pt saw Urol 08/2014: prostate bx 12/2014 showed high grade prostate intraepithelial neoplasia on 2 of 12 samples.  Urol following PSA closely (04/13/16 6.45, rising slightly so urology did a prostate MRI and this was normal).  [facility].  PSA 12/03/16=3.77.  PSA up 5.41 on 06/08/17--likely due to d/c of finasteride.  Cont monit PSA.  Marland Kitchen. Episcleritis of right eye    w/mild scleritis right eye ([facility] opht 2011); also with hx of ocular varicella at age 62.  Marland Kitchen. GERD (gastroesophageal reflux disease)    Tums  . Gout   . Hypogonadism male 2009  . Nephrolithiasis   . Osteoarthritis of left knee    Dr. Turner Danielsowan    Past Surgical History:  Procedure Laterality Date  . COLONOSCOPY     X 2, normal (for strong FH of colon cancer.  Last in 2009 was normal.  Repeat 10 yrs ([facility]).  Marland Kitchen. FRACTURE SURGERY Left   . KNEE ARTHROSCOPY     Left: 1990s and early 2000's.  Marland Kitchen. LITHOTRIPSY     1990s  . ROTATOR CUFF REPAIR     Right  . THROAT SURGERY     Infected  branchial cleft cyst 2007  . TOTAL KNEE ARTHROPLASTY Left 03/10/2016   Procedure: TOTAL KNEE ARTHROPLASTY;  Surgeon: Marcene CorningPeter Dalldorf, MD;  Location: [facility] OR;  Service: Orthopedics;  Laterality: Left;    Outpatient Medications Prior to Visit  Medication Sig Dispense Refill  . CLARAVIS 40 MG capsule Take 1 capsule by mouth 2 (two) times daily.  0  . finasteride (PROSCAR) 5 MG tablet Take 5 mg by mouth daily.  11  . NAPROXEN PO Take 1 tablet by mouth 2 (two) times daily.    . methylphenidate (RITALIN) 10 MG tablet 1 tab po q Afternoon as needed 30 tablet 0  . methylphenidate 54 MG PO CR tablet Take 1 tablet (54 mg total) by mouth every morning. 30 tablet 0   No facility-administered medications prior to visit.     Allergies  Allergen Reactions  . Sulfa Antibiotics Rash    ROS As per HPI  PE: Blood pressure (!) 143/78, pulse 80, temperature 98.4 F (36.9 C), temperature source Oral, resp. rate 16, height 6' 0.5" (1.842 m), weight 269 lb (122 kg), SpO2 97 %. Wt Readings from Last 2 Encounters:  03/18/18 269 lb (122 kg)  02/11/18 272 lb 8 oz (123.6 kg)  Gen: alert, oriented x 4, affect pleasant.  Lucid thinking and conversation noted. HEENT: PERRLA, EOMI.   Neck: no LAD, mass, or thyromegaly. CV: RRR, no m/r/g LUNGS: CTA bilat, nonlabored. NEURO: no tremor or tics noted on observation.  Coordination intact. CN 2-12 grossly intact bilaterally, strength 5/5 in all extremeties.  No ataxia.   LABS:    Chemistry      Component Value Date/Time   NA 139 12/02/2017 0856   K 4.6 12/02/2017 0856   CL 105 12/02/2017 0856   CO2 27 12/02/2017 0856   BUN 16 12/02/2017 0856   CREATININE 0.93 12/02/2017 0856      Component Value Date/Time   CALCIUM 9.3 12/02/2017 0856   ALKPHOS 68 12/02/2017 0856   AST 23 12/02/2017 0856   ALT 31 12/02/2017 0856   BILITOT 0.5 12/02/2017 0856       IMPRESSION AND PLAN:  Adult ADD: The current medical regimen is effective;  continue present plan  and medications. I eRx'd  methelphenidate ER 54mg  qd 90 day supply + methylphenidate 10mg , 1 q afternoon qd prn, 90 day supply.   Pt to call in 3 mo with request for another 90 d supply.  Will see him in office q 79mo for routine f/u of this problem. Controlled substance contract reviewed with patient today.  Patient signed this and it will be placed in the chart.    UDS next visit.  An After Visit Summary was printed and given to the patient.  FOLLOW UP: Return in about 6 months (around 09/18/2018) for annual CPE (fasting).  Signed:  Santiago Bumpers, MD           03/18/2018

## 2018-03-21 ENCOUNTER — Other Ambulatory Visit: Payer: Self-pay | Admitting: Urology

## 2018-03-21 DIAGNOSIS — R972 Elevated prostate specific antigen [PSA]: Secondary | ICD-10-CM | POA: Diagnosis not present

## 2018-03-21 DIAGNOSIS — N401 Enlarged prostate with lower urinary tract symptoms: Secondary | ICD-10-CM | POA: Diagnosis not present

## 2018-03-21 DIAGNOSIS — R3912 Poor urinary stream: Secondary | ICD-10-CM | POA: Diagnosis not present

## 2018-03-21 DIAGNOSIS — N5201 Erectile dysfunction due to arterial insufficiency: Secondary | ICD-10-CM | POA: Diagnosis not present

## 2018-03-30 DIAGNOSIS — D225 Melanocytic nevi of trunk: Secondary | ICD-10-CM | POA: Diagnosis not present

## 2018-03-30 DIAGNOSIS — L700 Acne vulgaris: Secondary | ICD-10-CM | POA: Diagnosis not present

## 2018-03-30 DIAGNOSIS — Z79899 Other long term (current) drug therapy: Secondary | ICD-10-CM | POA: Diagnosis not present

## 2018-04-01 ENCOUNTER — Encounter: Payer: Self-pay | Admitting: Internal Medicine

## 2018-04-04 ENCOUNTER — Other Ambulatory Visit: Payer: BLUE CROSS/BLUE SHIELD

## 2018-04-04 ENCOUNTER — Encounter: Payer: Self-pay | Admitting: Family Medicine

## 2018-04-08 ENCOUNTER — Ambulatory Visit
Admission: RE | Admit: 2018-04-08 | Discharge: 2018-04-08 | Disposition: A | Discharge status: Home / Self Care | Payer: BLUE CROSS/BLUE SHIELD | Source: Ambulatory Visit | Attending: Urology | Admitting: Urology

## 2018-04-08 ENCOUNTER — Ambulatory Visit (INDEPENDENT_AMBULATORY_CARE_PROVIDER_SITE_OTHER): Payer: BLUE CROSS/BLUE SHIELD

## 2018-04-08 DIAGNOSIS — R972 Elevated prostate specific antigen [PSA]: Secondary | ICD-10-CM | POA: Diagnosis not present

## 2018-04-08 DIAGNOSIS — Z23 Encounter for immunization: Secondary | ICD-10-CM | POA: Diagnosis not present

## 2018-04-08 MED ORDER — GADOBENATE DIMEGLUMINE 529 MG/ML IV SOLN
20.0000 mL | Freq: Once | INTRAVENOUS | Status: AC | PRN
Start: 2018-04-08 — End: 2018-04-08
  Administered 2018-04-08: 20 mL via INTRAVENOUS

## 2018-04-08 NOTE — Progress Notes (Signed)
Patient presents today for #2 Shingrix vaccine. Patient tolerated well and left with no complaints.

## 2018-04-11 ENCOUNTER — Encounter: Payer: Self-pay | Admitting: Family Medicine

## 2018-04-15 ENCOUNTER — Other Ambulatory Visit: Payer: Self-pay | Admitting: Family Medicine

## 2018-04-15 MED ORDER — METHYLPHENIDATE HCL 10 MG PO TABS: ORAL_TABLET | ORAL | 0 refills | Status: DC

## 2018-04-15 MED ORDER — METHYLPHENIDATE HCL ER (OSM) 54 MG PO TBCR: 54.0000 mg | EXTENDED_RELEASE_TABLET | ORAL | 0 refills | Status: DC

## 2018-04-15 NOTE — Telephone Encounter (Signed)
SW pt and he stated that at his last visit he had ask Dr. Milinda CaveMcGowen to send in a 30 day supply to [facility] and then we would start sending these medications to [facility]. Pt stated that he has about 5 tabs of each left. Requesting new Rx be sent to [facility].  I SW John at [facility] to find out when pt last picked up Rx's and how much was dispensed since the Rx in our system was for 90 day supply. John stated that pt picked up both Rx's on 03/18/18 and was dispensed 34 tabs for both.   Please advise. Thanks.

## 2018-04-15 NOTE — Telephone Encounter (Signed)
Pt advised and voiced understanding.   

## 2018-04-15 NOTE — Telephone Encounter (Signed)
Copied from CRM [phone]. Topic: Quick Communication - Rx Refill/Question >> Apr 15, 2018 10:53 AM Gaynelle AduPoole, Shalonda wrote: Medication: methelphenidate ER 54mg , methylphenidate 10mg   Has the patient contacted their pharmacy? No   Preferred Pharmacy (with phone number or street name): [facility] [place] [address] [phone] (Phone) [phone] (Fax) Patient requested a 90 day suppluy

## 2018-05-04 DIAGNOSIS — L700 Acne vulgaris: Secondary | ICD-10-CM | POA: Diagnosis not present

## 2018-05-04 DIAGNOSIS — Z79899 Other long term (current) drug therapy: Secondary | ICD-10-CM | POA: Diagnosis not present

## 2018-05-04 DIAGNOSIS — K130 Diseases of lips: Secondary | ICD-10-CM | POA: Diagnosis not present

## 2018-05-09 ENCOUNTER — Encounter: Payer: BLUE CROSS/BLUE SHIELD | Admitting: Internal Medicine

## 2018-05-16 ENCOUNTER — Encounter: Payer: BLUE CROSS/BLUE SHIELD | Admitting: Internal Medicine

## 2018-06-08 DIAGNOSIS — K130 Diseases of lips: Secondary | ICD-10-CM | POA: Diagnosis not present

## 2018-06-08 DIAGNOSIS — Z79899 Other long term (current) drug therapy: Secondary | ICD-10-CM | POA: Diagnosis not present

## 2018-06-08 DIAGNOSIS — L700 Acne vulgaris: Secondary | ICD-10-CM | POA: Diagnosis not present

## 2018-06-21 ENCOUNTER — Ambulatory Visit: Payer: BLUE CROSS/BLUE SHIELD | Admitting: Family Medicine

## 2018-06-21 ENCOUNTER — Encounter: Payer: Self-pay | Admitting: Family Medicine

## 2018-06-21 VITALS — BP 121/83 | HR 102 | Temp 98.6°F | Resp 16 | Ht 72.0 in | Wt 263.0 lb

## 2018-06-21 DIAGNOSIS — J029 Acute pharyngitis, unspecified: Secondary | ICD-10-CM | POA: Diagnosis not present

## 2018-06-21 DIAGNOSIS — J0110 Acute frontal sinusitis, unspecified: Secondary | ICD-10-CM

## 2018-06-21 LAB — POCT RAPID STREP A (OFFICE): Rapid Strep A Screen: NEGATIVE

## 2018-06-21 MED ORDER — AMOXICILLIN-POT CLAVULANATE 875-125 MG PO TABS: 1.0000 | ORAL_TABLET | Freq: Two times a day (BID) | ORAL | 0 refills | Status: DC

## 2018-06-21 MED ORDER — METHYLPREDNISOLONE ACETATE 80 MG/ML IJ SUSP
80.0000 mg | Freq: Once | INTRAMUSCULAR | Status: AC
  Administered 2018-06-21: 80 mg via INTRAMUSCULAR

## 2018-06-21 NOTE — Progress Notes (Signed)
Carlos Ellis , Carlos Ellis, 62 y.o., male MRN: [id number] Patient Care Team    Relationship Specialty Notifications Start End  McGowen, Carlos Blackwater, MD PCP - General Family Medicine  05/18/11   Carlos Aloe, MD Consulting Physician Urology  09/18/14     Chief Complaint  Patient presents with  . Sore Throat     Subjective: Pt presents for an OV with complaints of Sore throat of 6 days duration.  Associated symptoms include Feeling run down, achy, deceased sleep, decreased appetite, fever (Tmax 101 F), cough and sinus pressure with drainage. He does not have a h/o asthma/COPD. He has not had his flu shot this year.  Pt has tried alk seltzer cold and chloraseptic to ease their symptoms.   Depression screen [facility] 2/9 12/02/2017  Decreased Interest 0  Down, Depressed, Hopeless 0  PHQ - 2 Score 0  Altered sleeping 1  Tired, decreased energy 0  Change in appetite 1  Feeling bad or failure about yourself  0  Trouble concentrating 2  Moving slowly or fidgety/restless 0  Suicidal thoughts 0  PHQ-9 Score 4  Difficult doing work/chores Somewhat difficult    Allergies  Allergen Reactions  . Sulfa Antibiotics Rash   Social History   Tobacco Use  . Smoking status: Never Smoker  . Smokeless tobacco: Never Used  Substance Use Topics  . Alcohol use: Yes    Alcohol/week: 3.0 standard drinks    Types: 3 Glasses of wine per week   Past Medical History:  Diagnosis Date  . Acne    Dr. Tamala Ellis (was on accutane at one point)  . Adult ADHD 11/2017  . Borderline hyperlipidemia   . BPH with obstruction/lower urinary tract symptoms 2015   finasteride trial 09/2015  . Bronchitis 10/06/2011  . Depression    Zoloft 2005  . Elevated blood pressure reading without diagnosis of hypertension   . Elevated PSA 2015   Bx benign 12/2014.  04/2016 prostate MRI negative. PSA stable at 4.29 as of 02/2018.  Marland Kitchen Episcleritis of right eye    w/mild scleritis right eye ([facility] opht 2011); also with hx of ocular  varicella at age 54.  Marland Kitchen GERD (gastroesophageal reflux disease)    Tums  . Gout   . Hypogonadism male 2009  . Nephrolithiasis   . Osteoarthritis of left knee    Dr. Mayer Ellis   Past Surgical History:  Procedure Laterality Date  . COLONOSCOPY     X 2, normal (for strong FH of colon cancer.  Last in 2009 was normal.  Repeat 10 yrs ([facility]).  Marland Kitchen FRACTURE SURGERY Left   . KNEE ARTHROSCOPY     Left: 1990s and early 2000's.  Marland Kitchen LITHOTRIPSY     1990s  . PROSTATE BIOPSY  12/2014   benign  . ROTATOR CUFF REPAIR     Right  . THROAT SURGERY     Infected branchial cleft cyst 2007  . TOTAL KNEE ARTHROPLASTY Left 03/10/2016   Procedure: TOTAL KNEE ARTHROPLASTY;  Surgeon: Carlos Nakayama, MD;  Location: [facility];  Service: Orthopedics;  Laterality: Left;   History reviewed. No pertinent family history. Allergies as of 06/21/2018      Reactions   Sulfa Antibiotics Rash      Medication List        Accurate as of 06/21/18 12:28 PM. Always use your most recent med list.          amoxicillin-clavulanate 875-125 MG tablet Commonly known as:  AUGMENTIN Take 1  tablet by mouth 2 (two) times daily.   CLARAVIS 40 MG capsule Generic drug:  ISOtretinoin Take 1 capsule by mouth 2 (two) times daily.   finasteride 5 MG tablet Commonly known as:  PROSCAR Take 5 mg by mouth daily.   methylphenidate 10 MG tablet Commonly known as:  RITALIN 1 tab po q Afternoon as needed   methylphenidate 54 MG CR tablet Commonly known as:  CONCERTA Take 1 tablet (54 mg total) by mouth every morning.   NAPROXEN PO Take 1 tablet by mouth 2 (two) times daily.       All past medical history, surgical history, allergies, family history, immunizations andmedications were updated in the EMR today and reviewed under the history and medication portions of their EMR.     ROS: Negative, with the exception of above mentioned in HPI   Objective:  BP 121/83 (BP Location: Left Arm, Patient Position: Sitting, Cuff Size:  Large)   Pulse (!) 102   Temp 98.6 F (37 C) (Oral)   Resp 16   Ht 6' (1.829 m)   Wt 263 lb (119.3 kg)   SpO2 96%   BMI 35.67 kg/m  Body mass index is 35.67 kg/m. Gen: Afebrile. No acute distress. Nontoxic in appearance, well developed, well nourished.  HENT: AT. [place]. Bilateral TM visualized without erythema or swelling. MMM, no oral lesions. Bilateral nares with erythema, swelling and drainage. Throat with mild erythema No exudates. Mild cough and TTP frontal sinus.  Eyes:Pupils Equal Round Reactive to light, Extraocular movements intact,  Conjunctiva without redness, discharge or icterus. Neck/lymp/endocrine: Supple,no lymphadenopathy CV: RRR  Chest: CTAB, no wheeze or crackles. Good air movement, normal resp effort.  Skin: no rashes, purpura or petechiae.  Neuro:  Normal gait. PERLA. EOMi. Alert. Oriented x3  No exam data present No results found. Results for orders placed or performed in visit on 06/21/18 (from the past 24 hour(s))  POCT rapid strep A     Status: Normal   Collection Time: 06/21/18 11:31 AM  Result Value Ref Range   Rapid Strep A Screen Negative Negative    Assessment/Plan: Carlos Ellis is a 62 y.o. male present for OV for  Sore throat - POCT rapid strep A--> negative - methylPREDNISolone acetate (DEPO-MEDROL) injection 80 mg Acute non-recurrent frontal sinusitis Frontal sinusitis and PND appreciated, no exudates of throat and negative strep test. Suspect nasal drainage as cause of his sore throat.  - methylPREDNISolone acetate (DEPO-MEDROL) injection 80 mg Rest, hydrate.  mucinex (DM if cough) Augmentin BID prescribed, take until completed.  IM depo medrol If cough present it can last up to 6-8 weeks.  F/U 2 weeks of not improved.    Reviewed expectations re: course of current medical issues.  Discussed self-management of symptoms.  Outlined signs and symptoms indicating need for more acute intervention.  Patient verbalized understanding and  all questions were answered.  Patient received an After-Visit Summary.    Orders Placed This Encounter  Procedures  . POCT rapid strep A     Note is dictated utilizing voice recognition software. Although note has been proof read prior to signing, occasional typographical errors still can be missed. If any questions arise, please do not hesitate to call for verification.   electronically signed by:  Howard Pouch, DO  [facility]

## 2018-06-21 NOTE — Patient Instructions (Signed)
Rest, hydrate.  mucinex (DM if cough) Augmentin BID prescribed, take until completed.  IM depo medrol If cough present it can last up to 6-8 weeks.  F/U 2 weeks of not improved.     Sinusitis, Adult Sinusitis is soreness and inflammation of your sinuses. Sinuses are hollow spaces in the bones around your face. They are located:  Around your eyes.  In the middle of your forehead.  Behind your nose.  In your cheekbones.  Your sinuses and nasal passages are lined with a stringy fluid (mucus). Mucus normally drains out of your sinuses. When your nasal tissues get inflamed or swollen, the mucus can get trapped or blocked so air cannot flow through your sinuses. This lets bacteria, viruses, and funguses grow, and that leads to infection. Follow these instructions at home: Medicines  Take, use, or apply over-the-counter and prescription medicines only as told by your doctor. These may include nasal sprays.  If you were prescribed an antibiotic medicine, take it as told by your doctor. Do not stop taking the antibiotic even if you start to feel better. Hydrate and Humidify  Drink enough water to keep your pee (urine) clear or pale yellow.  Use a cool mist humidifier to keep the humidity level in your home above 50%.  Breathe in steam for 10-15 minutes, 3-4 times a day or as told by your doctor. You can do this in the bathroom while a hot shower is running.  Try not to spend time in cool or dry air. Rest  Rest as much as possible.  Sleep with your head raised (elevated).  Make sure to get enough sleep each night. General instructions  Put a warm, moist washcloth on your face 3-4 times a day or as told by your doctor. This will help with discomfort.  Wash your hands often with soap and water. If there is no soap and water, use hand sanitizer.  Do not smoke. Avoid being around people who are smoking (secondhand smoke).  Keep all follow-up visits as told by your doctor. This is  important. Contact a doctor if:  You have a fever.  Your symptoms get worse.  Your symptoms do not get better within 10 days. Get help right away if:  You have a very bad headache.  You cannot stop throwing up (vomiting).  You have pain or swelling around your face or eyes.  You have trouble seeing.  You feel confused.  Your neck is stiff.  You have trouble breathing. This information is not intended to replace advice given to you by your health care provider. Make sure you discuss any questions you have with your health care provider. Document Released: 01/27/2008 Document Revised: 04/05/2016 Document Reviewed: 06/05/2015 Elsevier Interactive Patient Education  [employer].

## 2018-06-23 DIAGNOSIS — R3912 Poor urinary stream: Secondary | ICD-10-CM | POA: Diagnosis not present

## 2018-06-23 DIAGNOSIS — N401 Enlarged prostate with lower urinary tract symptoms: Secondary | ICD-10-CM | POA: Diagnosis not present

## 2018-06-23 LAB — PSA: PSA: 5.98

## 2018-07-06 DIAGNOSIS — L821 Other seborrheic keratosis: Secondary | ICD-10-CM | POA: Diagnosis not present

## 2018-07-06 DIAGNOSIS — K130 Diseases of lips: Secondary | ICD-10-CM | POA: Diagnosis not present

## 2018-07-06 DIAGNOSIS — L700 Acne vulgaris: Secondary | ICD-10-CM | POA: Diagnosis not present

## 2018-07-06 DIAGNOSIS — Z79899 Other long term (current) drug therapy: Secondary | ICD-10-CM | POA: Diagnosis not present

## 2018-07-20 ENCOUNTER — Other Ambulatory Visit: Payer: Self-pay | Admitting: Family Medicine

## 2018-07-20 MED ORDER — METHYLPHENIDATE HCL 10 MG PO TABS: ORAL_TABLET | ORAL | 0 refills | Status: DC

## 2018-07-20 MED ORDER — METHYLPHENIDATE HCL ER (OSM) 54 MG PO TBCR: 54.0000 mg | EXTENDED_RELEASE_TABLET | ORAL | 0 refills | Status: DC

## 2018-07-20 NOTE — Telephone Encounter (Signed)
Copied from CRM [id number]. Topic: Quick Communication - Rx Refill/Question >> Jul 20, 2018 10:41 AM Zada GirtLander, Carlos Ellis wrote: Medication: methylphenidate (RITALIN) 10 MG tablet, methylphenidate 54 MG PO CR tablet  Has the patient contacted their pharmacy? Yes.   (Agent: If no, request that the patient contact the pharmacy for the refill.) (Agent: If yes, when and what did the pharmacy advise?)  Preferred Pharmacy (with phone number or street name): [facility] [place] [address] [place] [id number] Phone: [phone] Fax: [phone]  Agent: Please be advised that RX refills may take up to 3 business days. We ask that you follow-up with your pharmacy.

## 2018-07-20 NOTE — Telephone Encounter (Signed)
Refill request for methylphenidate 10mg  and methylphenidate 54mg   Last OV 03/18/18 Next OV 09/08/18  Last RX 04/15/18 # 90 No refills for both RX's.  Please advise RF.

## 2018-08-05 ENCOUNTER — Ambulatory Visit (INDEPENDENT_AMBULATORY_CARE_PROVIDER_SITE_OTHER): Payer: BLUE CROSS/BLUE SHIELD

## 2018-08-05 DIAGNOSIS — Z23 Encounter for immunization: Secondary | ICD-10-CM

## 2018-08-19 DIAGNOSIS — H524 Presbyopia: Secondary | ICD-10-CM | POA: Diagnosis not present

## 2018-08-25 ENCOUNTER — Telehealth: Payer: Self-pay | Admitting: *Deleted

## 2018-08-25 DIAGNOSIS — L309 Dermatitis, unspecified: Secondary | ICD-10-CM

## 2018-08-25 MED ORDER — NYSTATIN 100000 UNIT/GM EX CREA: TOPICAL_CREAM | Freq: Two times a day (BID) | CUTANEOUS | 2 refills | Status: DC

## 2018-08-25 NOTE — Telephone Encounter (Signed)
OK to RF as previously prescribed, with 2 additional RF's.-thx

## 2018-08-25 NOTE — Telephone Encounter (Signed)
Rx sent 

## 2018-08-25 NOTE — Telephone Encounter (Signed)
Pts currently Rx expired. Received fax from pharmacy requesting Rx for Nystatin 100,000 unit/GM Cream. Please advise. Thanks.

## 2018-08-31 ENCOUNTER — Other Ambulatory Visit: Payer: Self-pay | Admitting: Family Medicine

## 2018-08-31 NOTE — Telephone Encounter (Signed)
Copied from CRM [phone]. Topic: Quick Communication - Rx Refill/Question >> Aug 31, 2018 10:02 AM Elliot GaultBell, Tiffany M wrote:  Medication: methylphenidate (RITALIN) 10 MG tablet , methylphenidate 54 MG PO CR tablet     Has the patient contacted their pharmacy? Yes   (Agent: If yes, when and what did the pharmacy advise?) advised to contact PCP due to medication being a controlled substance   Preferred Pharmacy (with phone number or street name):   [facility] [place] [address] [phone] (Phone) [phone] (Fax)  Agent: Please be advised that RX refills may take up to 3 business days. We ask that you follow-up with your pharmacy.

## 2018-08-31 NOTE — Telephone Encounter (Signed)
Looks like pt wants to start getting these medications through [facility]. I pulled his database to see when it was last filled. I placed this on your desk for review.  Please advise. Thanks.

## 2018-09-01 MED ORDER — METHYLPHENIDATE HCL 10 MG PO TABS: ORAL_TABLET | ORAL | 0 refills | Status: DC

## 2018-09-01 MED ORDER — METHYLPHENIDATE HCL ER (OSM) 54 MG PO TBCR: 54.0000 mg | EXTENDED_RELEASE_TABLET | ORAL | 0 refills | Status: DC

## 2018-09-01 NOTE — Telephone Encounter (Signed)
OK, sent in 90 d supply of methylphenidate short acting and ER. Most recent fill of each of these was 07/20/18, #30 day supply.

## 2018-09-02 NOTE — Telephone Encounter (Signed)
Pt advised and voiced understanding.   

## 2018-09-05 DIAGNOSIS — R972 Elevated prostate specific antigen [PSA]: Secondary | ICD-10-CM | POA: Diagnosis not present

## 2018-09-05 DIAGNOSIS — N401 Enlarged prostate with lower urinary tract symptoms: Secondary | ICD-10-CM | POA: Diagnosis not present

## 2018-09-05 DIAGNOSIS — R3912 Poor urinary stream: Secondary | ICD-10-CM | POA: Diagnosis not present

## 2018-09-08 ENCOUNTER — Encounter: Payer: BLUE CROSS/BLUE SHIELD | Admitting: Family Medicine

## 2018-09-11 ENCOUNTER — Encounter: Payer: Self-pay | Admitting: Family Medicine

## 2018-09-20 ENCOUNTER — Encounter: Payer: BLUE CROSS/BLUE SHIELD | Admitting: Family Medicine

## 2018-10-11 ENCOUNTER — Ambulatory Visit (INDEPENDENT_AMBULATORY_CARE_PROVIDER_SITE_OTHER): Payer: BLUE CROSS/BLUE SHIELD | Admitting: Family Medicine

## 2018-10-11 ENCOUNTER — Encounter: Payer: BLUE CROSS/BLUE SHIELD | Admitting: Family Medicine

## 2018-10-11 ENCOUNTER — Encounter: Payer: Self-pay | Admitting: Family Medicine

## 2018-10-11 VITALS — BP 120/76 | HR 75 | Temp 98.7°F | Resp 16 | Ht 72.0 in | Wt 259.0 lb

## 2018-10-11 DIAGNOSIS — Z79899 Other long term (current) drug therapy: Secondary | ICD-10-CM | POA: Diagnosis not present

## 2018-10-11 DIAGNOSIS — F988 Other specified behavioral and emotional disorders with onset usually occurring in childhood and adolescence: Secondary | ICD-10-CM | POA: Diagnosis not present

## 2018-10-11 MED ORDER — METHYLPHENIDATE HCL 10 MG PO TABS: ORAL_TABLET | ORAL | 0 refills | Status: DC

## 2018-10-11 MED ORDER — METHYLPHENIDATE HCL ER (OSM) 54 MG PO TBCR: 54.0000 mg | EXTENDED_RELEASE_TABLET | ORAL | 0 refills | Status: DC

## 2018-10-11 NOTE — Progress Notes (Signed)
OFFICE VISIT  10/11/2018   CC:  Chief Complaint  Patient presents with  . Follow-up    RCI, pt is fasting.    HPI:    Patient is a 63 y.o. Caucasian male who presents for 6 mo f/u adult ADD.  Pt states all is going well with the med at current dosing: much improved focus, concentration, task completion.  Less frustration, better multitasking, less impulsivity and restlessness.  Mood is stable. No side effects from the medication. Takes ER qAM and only 2-3 times per week takes afternoon short acting methylph dose.   Past Medical History:  Diagnosis Date  . Acne    Dr. Katrinka Blazing (was on accutane at one point)  . Adult ADHD 11/2017  . Borderline hyperlipidemia   . BPH with obstruction/lower urinary tract symptoms 2015   finasteride trial 09/2015  . Bronchitis 10/06/2011  . Depression    Zoloft 2005  . Elevated blood pressure reading without diagnosis of hypertension   . Elevated PSA 2015   Bx benign 12/2014.  04/2016 prostate MRI negative. PSA stable at 4.29 as of 02/2018.  PSA doubled 08/2018-->repeat PSA 4 mo per urol.  Marland Kitchen Episcleritis of right eye    w/mild scleritis right eye ([facility] opht 2011); also with hx of ocular varicella at age 23.  Marland Kitchen GERD (gastroesophageal reflux disease)    Tums  . Gout   . Hypogonadism male 2009  . Nephrolithiasis   . Osteoarthritis of left knee    Dr. Turner Daniels    Past Surgical History:  Procedure Laterality Date  . COLONOSCOPY     X 2, normal (for strong FH of colon cancer.  Last in 2009 was normal.  Repeat 10 yrs ([facility]).  Marland Kitchen FRACTURE SURGERY Left   . KNEE ARTHROSCOPY     Left: 1990s and early 2000's.  Marland Kitchen LITHOTRIPSY     1990s  . PROSTATE BIOPSY  12/2014   benign  . ROTATOR CUFF REPAIR     Right  . THROAT SURGERY     Infected branchial cleft cyst 2007  . TOTAL KNEE ARTHROPLASTY Left 03/10/2016   Procedure: TOTAL KNEE ARTHROPLASTY;  Surgeon: Marcene Corning, MD;  Location: [facility] OR;  Service: Orthopedics;  Laterality: Left;    Outpatient  Medications Prior to Visit  Medication Sig Dispense Refill  . alclomethasone (ACLOVATE) 0.05 % ointment Apply 1 application topically daily as needed.    Marland Kitchen CLARAVIS 40 MG capsule Take 1 capsule by mouth daily.   0  . finasteride (PROSCAR) 5 MG tablet Take 5 mg by mouth daily.  11  . NAPROXEN PO Take 1 tablet by mouth 2 (two) times daily.    Marland Kitchen nystatin cream (MYCOSTATIN) Apply topically 2 (two) times daily. 30 g 2  . methylphenidate (RITALIN) 10 MG tablet 1 tab po q Afternoon as needed 90 tablet 0  . methylphenidate 54 MG PO CR tablet Take 1 tablet (54 mg total) by mouth every morning. 90 tablet 0  . amoxicillin-clavulanate (AUGMENTIN) 875-125 MG tablet Take 1 tablet by mouth 2 (two) times daily. (Patient not taking: Reported on 10/11/2018) 20 tablet 0   No facility-administered medications prior to visit.     Allergies  Allergen Reactions  . Sulfa Antibiotics Rash    ROS As per HPI  PE: Blood pressure 120/76, pulse 75, temperature 98.7 F (37.1 C), temperature source Oral, resp. rate 16, height 6' (1.829 m), weight 259 lb (117.5 kg), SpO2 98 %. Wt Readings from Last 2 Encounters:  10/11/18 259 lb (117.5 kg)  06/21/18 263 lb (119.3 kg)    Gen: alert, oriented x 4, affect pleasant.  Lucid thinking and conversation noted. HEENT: PERRLA, EOMI.   Neck: no LAD, mass, or thyromegaly. CV: RRR, no m/r/g LUNGS: CTA bilat, nonlabored. NEURO: no tremor or tics noted on observation.  Coordination intact. CN 2-12 grossly intact bilaterally, strength 5/5 in all extremeties.  No ataxia.  LABS:    Chemistry      Component Value Date/Time   NA 139 12/02/2017 0856   K 4.6 12/02/2017 0856   CL 105 12/02/2017 0856   CO2 27 12/02/2017 0856   BUN 16 12/02/2017 0856   CREATININE 0.93 12/02/2017 0856      Component Value Date/Time   CALCIUM 9.3 12/02/2017 0856   ALKPHOS 68 12/02/2017 0856   AST 23 12/02/2017 0856   ALT 31 12/02/2017 0856   BILITOT 0.5 12/02/2017 0856      IMPRESSION  AND PLAN:  Adult ADD: pt very pleased with efficacy of med and has no adverse side effects. I did electronic rx's for methylphenidate CR 54mg  qAM, #90 day supply and methylphenidate 10mg  "plain" q afternoon prn, #90 d supply today.   Controlled substance contract is UTD. UDS today.  Pt last took his methylphenidate ER yesterday morning.  Did not take short acting methylphenidate yesterday or today.  An After Visit Summary was printed and given to the patient.  FOLLOW UP: Return in about 3 months (around 01/09/2019) for annual CPE (fasting).  Signed:  Santiago Bumpers, MD           10/11/2018

## 2018-10-11 NOTE — Progress Notes (Deleted)
Office Note 10/11/2018  CC: No chief complaint on file.   HPI:  Carlos Ellis is a 63 y.o. White male who is here for annual health maintenance exam.   Past Medical History:  Diagnosis Date  . Acne    Dr. Katrinka Blazing (was on accutane at one point)  . Adult ADHD 11/2017  . Borderline hyperlipidemia   . BPH with obstruction/lower urinary tract symptoms 2015   finasteride trial 09/2015  . Bronchitis 10/06/2011  . Depression    Zoloft 2005  . Elevated blood pressure reading without diagnosis of hypertension   . Elevated PSA 2015   Bx benign 12/2014.  04/2016 prostate MRI negative. PSA stable at 4.29 as of 02/2018.  PSA doubled 08/2018-->repeat PSA 4 mo per urol.  Marland Kitchen Episcleritis of right eye    w/mild scleritis right eye ([facility] opht 2011); also with hx of ocular varicella at age 9.  Marland Kitchen GERD (gastroesophageal reflux disease)    Tums  . Gout   . Hypogonadism male 2009  . Nephrolithiasis   . Osteoarthritis of left knee    Dr. Turner Daniels    Past Surgical History:  Procedure Laterality Date  . COLONOSCOPY     X 2, normal (for strong FH of colon cancer.  Last in 2009 was normal.  Repeat 10 yrs ([facility]).  Marland Kitchen FRACTURE SURGERY Left   . KNEE ARTHROSCOPY     Left: 1990s and early 2000's.  Marland Kitchen LITHOTRIPSY     1990s  . PROSTATE BIOPSY  12/2014   benign  . ROTATOR CUFF REPAIR     Right  . THROAT SURGERY     Infected branchial cleft cyst 2007  . TOTAL KNEE ARTHROPLASTY Left 03/10/2016   Procedure: TOTAL KNEE ARTHROPLASTY;  Surgeon: Marcene Corning, MD;  Location: [facility] OR;  Service: Orthopedics;  Laterality: Left;    No family history on file.  Social History   Socioeconomic History  . Marital status: Married    Spouse name: Not on file  . Number of children: Not on file  . Years of education: Not on file  . Highest education level: Not on file  Occupational History  . Not on file  Social Needs  . Financial resource strain: Not on file  . Food insecurity:    Worry: Not on file   Inability: Not on file  . Transportation needs:    Medical: Not on file    Non-medical: Not on file  Tobacco Use  . Smoking status: Never Smoker  . Smokeless tobacco: Never Used  Substance and Sexual Activity  . Alcohol use: Yes    Alcohol/week: 3.0 standard drinks    Types: 3 Glasses of wine per week  . Drug use: No  . Sexual activity: Not on file  Lifestyle  . Physical activity:    Days per week: Not on file    Minutes per session: Not on file  . Stress: Not on file  Relationships  . Social connections:    Talks on phone: Not on file    Gets together: Not on file    Attends religious service: Not on file    Active member of club or organization: Not on file    Attends meetings of clubs or organizations: Not on file    Relationship status: Not on file  . Intimate partner violence:    Fear of current or ex partner: Not on file    Emotionally abused: Not on file    Physically abused: Not on  file    Forced sexual activity: Not on file  Other Topics Concern  . Not on file  Social History Narrative   Married, 2 teenage children.   Relocated to [place] from [place] 2007.   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   No tobacco.  Drinks 2-3 glasses of wine per week.  No drugs.   Walks his dog about 1 mile per day.    Outpatient Medications Prior to Visit  Medication Sig Dispense Refill  . amoxicillin-clavulanate (AUGMENTIN) 875-125 MG tablet Take 1 tablet by mouth 2 (two) times daily. 20 tablet 0  . CLARAVIS 40 MG capsule Take 1 capsule by mouth 2 (two) times daily.  0  . finasteride (PROSCAR) 5 MG tablet Take 5 mg by mouth daily.  11  . methylphenidate (RITALIN) 10 MG tablet 1 tab po q Afternoon as needed 90 tablet 0  . methylphenidate 54 MG PO CR tablet Take 1 tablet (54 mg total) by mouth every morning. 90 tablet 0  . NAPROXEN PO Take 1 tablet by mouth 2 (two) times daily.    Marland Kitchen nystatin cream (MYCOSTATIN) Apply  topically 2 (two) times daily. 30 g 2   No facility-administered medications prior to visit.     Allergies  Allergen Reactions  . Sulfa Antibiotics Rash    ROS *** PE; There were no vitals taken for this visit. *** Pertinent labs:  Lab Results  Component Value Date   TSH 3.15 12/02/2017   Lab Results  Component Value Date   WBC 7.6 12/02/2017   HGB 15.2 12/02/2017   HCT 44.3 12/02/2017   MCV 87.7 12/02/2017   PLT 296.0 12/02/2017   Lab Results  Component Value Date   CREATININE 0.93 12/02/2017   BUN 16 12/02/2017   NA 139 12/02/2017   K 4.6 12/02/2017   CL 105 12/02/2017   CO2 27 12/02/2017   Lab Results  Component Value Date   ALT 31 12/02/2017   AST 23 12/02/2017   ALKPHOS 68 12/02/2017   BILITOT 0.5 12/02/2017   Lab Results  Component Value Date   CHOL 176 12/02/2017   Lab Results  Component Value Date   HDL 44.90 12/02/2017   Lab Results  Component Value Date   LDLCALC 106 (H) 12/02/2017   Lab Results  Component Value Date   TRIG 127.0 12/02/2017   Lab Results  Component Value Date   CHOLHDL 4 12/02/2017   Lab Results  Component Value Date   PSA 3.77 12/03/2016   PSA 3.65 10/13/2013     ASSESSMENT AND PLAN:   Health maintenance exam: Reviewed age and gender appropriate health maintenance issues (prudent diet, regular exercise, health risks of tobacco and excessive alcohol, use of seatbelts, fire alarms in home, use of sunscreen).  Also reviewed age and gender appropriate health screening as well as vaccine recommendations. Vaccines: UTD, including shingrix. Labs: CBC, CMET, FLP. Prostate ca screening: hx of elevated PSA-->follewed by urology. Colon ca screening: due for repeat colonoscopy-->referred pt last year to [facility] but he did not go.  ***  An After Visit Summary was printed and given to the patient.  FOLLOW UP:  No follow-ups on file.  Signed:  Santiago Bumpers, MD           10/11/2018

## 2018-10-12 LAB — PAIN MGMT, PROFILE 8 W/CONF, U
6 Acetylmorphine: NEGATIVE ng/mL (ref <10)
Alcohol Metabolites: NEGATIVE ng/mL (ref <500)
Amphetamines: NEGATIVE ng/mL (ref <500)
Benzodiazepines: NEGATIVE ng/mL (ref <100)
Buprenorphine, Urine: NEGATIVE ng/mL (ref <5)
Cocaine Metabolite: NEGATIVE ng/mL (ref <150)
Creatinine: 139.9 mg/dL
MDMA: NEGATIVE ng/mL (ref <500)
Marijuana Metabolite: NEGATIVE ng/mL (ref <20)
Opiates: NEGATIVE ng/mL (ref <100)
Oxidant: NEGATIVE ug/mL (ref <200)
Oxycodone: NEGATIVE ng/mL (ref <100)
pH: 6.55 (ref 4.5–9.0)

## 2018-10-13 ENCOUNTER — Encounter: Payer: Self-pay | Admitting: Family Medicine

## 2018-11-07 DIAGNOSIS — Z79899 Other long term (current) drug therapy: Secondary | ICD-10-CM | POA: Diagnosis not present

## 2018-11-07 DIAGNOSIS — L700 Acne vulgaris: Secondary | ICD-10-CM | POA: Diagnosis not present

## 2018-12-15 ENCOUNTER — Encounter: Payer: BLUE CROSS/BLUE SHIELD | Admitting: Family Medicine

## 2018-12-17 ENCOUNTER — Other Ambulatory Visit: Payer: Self-pay | Admitting: Family Medicine

## 2018-12-19 NOTE — Telephone Encounter (Signed)
RF request for lotrisone cream.  This is not on current med list.  I contacted patient and he state he still uses it on a rash that continually comes and goes?  He states that Dr Milinda Cave does know about this.  Please advise.

## 2019-01-02 DIAGNOSIS — N401 Enlarged prostate with lower urinary tract symptoms: Secondary | ICD-10-CM | POA: Diagnosis not present

## 2019-01-02 DIAGNOSIS — R3912 Poor urinary stream: Secondary | ICD-10-CM | POA: Diagnosis not present

## 2019-01-02 LAB — PSA: PSA: 8.97

## 2019-01-04 ENCOUNTER — Telehealth: Payer: Self-pay | Admitting: Family Medicine

## 2019-01-04 ENCOUNTER — Encounter: Payer: Self-pay | Admitting: Family Medicine

## 2019-01-04 DIAGNOSIS — Z1211 Encounter for screening for malignant neoplasm of colon: Secondary | ICD-10-CM

## 2019-01-04 NOTE — Telephone Encounter (Signed)
Patient is requesting a referral for a screening colonoscopy at [facility]. His last one was at [facility] approximately 11 years ago.

## 2019-01-04 NOTE — Telephone Encounter (Signed)
Okay for referral? Please advise, thanks.

## 2019-01-04 NOTE — Telephone Encounter (Signed)
My Chart message sent

## 2019-01-04 NOTE — Telephone Encounter (Signed)
Yes referral is ok, dx is colon cancer screening.-thx

## 2019-01-04 NOTE — Addendum Note (Signed)
Addended by: Emi Holes D on: 01/04/2019 03:22 PM   Modules accepted: Orders

## 2019-01-10 ENCOUNTER — Encounter: Payer: BLUE CROSS/BLUE SHIELD | Admitting: Family Medicine

## 2019-01-10 NOTE — Telephone Encounter (Signed)
Pt was notified.  

## 2019-02-27 DIAGNOSIS — R3912 Poor urinary stream: Secondary | ICD-10-CM | POA: Diagnosis not present

## 2019-02-27 DIAGNOSIS — N401 Enlarged prostate with lower urinary tract symptoms: Secondary | ICD-10-CM | POA: Diagnosis not present

## 2019-02-28 LAB — PSA: PSA: 13.2

## 2019-03-01 DIAGNOSIS — M1712 Unilateral primary osteoarthritis, left knee: Secondary | ICD-10-CM | POA: Diagnosis not present

## 2019-03-01 DIAGNOSIS — Z96652 Presence of left artificial knee joint: Secondary | ICD-10-CM | POA: Diagnosis not present

## 2019-03-03 ENCOUNTER — Encounter: Payer: Self-pay | Admitting: Family Medicine

## 2019-03-03 DIAGNOSIS — R3912 Poor urinary stream: Secondary | ICD-10-CM | POA: Diagnosis not present

## 2019-03-03 DIAGNOSIS — N401 Enlarged prostate with lower urinary tract symptoms: Secondary | ICD-10-CM | POA: Diagnosis not present

## 2019-03-03 DIAGNOSIS — R972 Elevated prostate specific antigen [PSA]: Secondary | ICD-10-CM | POA: Diagnosis not present

## 2019-03-08 ENCOUNTER — Encounter: Payer: Self-pay | Admitting: Family Medicine

## 2019-03-09 ENCOUNTER — Encounter: Payer: Self-pay | Admitting: Family Medicine

## 2019-03-13 ENCOUNTER — Other Ambulatory Visit: Payer: Self-pay | Admitting: Family Medicine

## 2019-03-13 ENCOUNTER — Telehealth: Payer: Self-pay | Admitting: Family Medicine

## 2019-03-13 MED ORDER — METHYLPHENIDATE HCL ER (OSM) 54 MG PO TBCR: 54.0000 mg | EXTENDED_RELEASE_TABLET | ORAL | 0 refills | Status: DC

## 2019-03-13 NOTE — Telephone Encounter (Signed)
Patient also requesting 7 day supply to be sent to [facility].

## 2019-03-13 NOTE — Telephone Encounter (Signed)
RF Request for methylphenidate 54 mg. Last OV 10/11/2018 Next OV 03/20/2019 Last RX 10/11/2018 # 90??   Please advise. s

## 2019-03-13 NOTE — Telephone Encounter (Signed)
Duplicate request... patient now also requesting short term [facility]>

## 2019-03-13 NOTE — Telephone Encounter (Signed)
Medication: methylphenidate 54 MG PO CR tablet [[id number]]  Patient states that he is out of medication Also requesting a short supply (about 7 days) [facility]    Has the patient contacted their pharmacy? Yes  (Agent: If no, request that the patient contact the pharmacy for the refill.) (Agent: If yes, when and what did the pharmacy advise?)  Preferred Pharmacy (with phone number or street name): [place], [place] [phone] (Phone) [phone] (Fax)    [facility] [address] [place] [address] [place] [address] [phone]    Agent: Please be advised that RX refills may take up to 3 business days. We ask that you follow-up with your pharmacy.

## 2019-03-16 ENCOUNTER — Encounter: Payer: Self-pay | Admitting: Family Medicine

## 2019-03-17 DIAGNOSIS — R972 Elevated prostate specific antigen [PSA]: Secondary | ICD-10-CM | POA: Diagnosis not present

## 2019-03-20 ENCOUNTER — Encounter: Payer: BLUE CROSS/BLUE SHIELD | Admitting: Family Medicine

## 2019-03-24 ENCOUNTER — Telehealth: Payer: Self-pay

## 2019-03-24 ENCOUNTER — Encounter: Payer: Self-pay | Admitting: Family Medicine

## 2019-03-24 ENCOUNTER — Other Ambulatory Visit: Payer: Self-pay

## 2019-03-24 ENCOUNTER — Ambulatory Visit (INDEPENDENT_AMBULATORY_CARE_PROVIDER_SITE_OTHER): Payer: BC Managed Care – PPO | Admitting: Family Medicine

## 2019-03-24 VITALS — BP 124/85 | HR 72 | Temp 98.4°F | Resp 16 | Ht 72.0 in | Wt 270.2 lb

## 2019-03-24 DIAGNOSIS — M546 Pain in thoracic spine: Secondary | ICD-10-CM

## 2019-03-24 DIAGNOSIS — F909 Attention-deficit hyperactivity disorder, unspecified type: Secondary | ICD-10-CM | POA: Diagnosis not present

## 2019-03-24 DIAGNOSIS — E7800 Pure hypercholesterolemia, unspecified: Secondary | ICD-10-CM

## 2019-03-24 LAB — COMPREHENSIVE METABOLIC PANEL
ALT: 32 U/L (ref 0–53)
AST: 25 U/L (ref 0–37)
Albumin: 4.4 g/dL (ref 3.5–5.2)
Alkaline Phosphatase: 74 U/L (ref 39–117)
BUN: 15 mg/dL (ref 6–23)
CO2: 25 mEq/L (ref 19–32)
Calcium: 9.4 mg/dL (ref 8.4–10.5)
Chloride: 106 mEq/L (ref 96–112)
Creatinine, Ser: 0.9 mg/dL (ref 0.40–1.50)
GFR: 85.7 mL/min (ref >60.00)
Glucose, Bld: 103 mg/dL — ABNORMAL HIGH (ref 70–99)
Potassium: 4.5 mEq/L (ref 3.5–5.1)
Sodium: 140 mEq/L (ref 135–145)
Total Bilirubin: 0.5 mg/dL (ref 0.2–1.2)
Total Protein: 6.8 g/dL (ref 6.0–8.3)

## 2019-03-24 LAB — LIPID PANEL
Cholesterol: 165 mg/dL (ref 0–200)
HDL: 43.9 mg/dL (ref >39.00)
LDL Cholesterol: 97 mg/dL (ref 0–99)
NonHDL: 120.62
Total CHOL/HDL Ratio: 4
Triglycerides: 116 mg/dL (ref 0.0–149.0)
VLDL: 23.2 mg/dL (ref 0.0–40.0)

## 2019-03-24 LAB — CBC WITH DIFFERENTIAL/PLATELET
Basophils Absolute: 0 10*3/uL (ref 0.0–0.1)
Basophils Relative: 0.7 % (ref 0.0–3.0)
Eosinophils Absolute: 0.3 10*3/uL (ref 0.0–0.7)
Eosinophils Relative: 4.5 % (ref 0.0–5.0)
HCT: 43.7 % (ref 39.0–52.0)
Hemoglobin: 14.7 g/dL (ref 13.0–17.0)
Lymphocytes Relative: 21.7 % (ref 12.0–46.0)
Lymphs Abs: 1.4 10*3/uL (ref 0.7–4.0)
MCHC: 33.6 g/dL (ref 30.0–36.0)
MCV: 88.2 fl (ref 78.0–100.0)
Monocytes Absolute: 0.6 10*3/uL (ref 0.1–1.0)
Monocytes Relative: 8.8 % (ref 3.0–12.0)
Neutro Abs: 4.1 10*3/uL (ref 1.4–7.7)
Neutrophils Relative %: 64.3 % (ref 43.0–77.0)
Platelets: 276 10*3/uL (ref 150.0–400.0)
RBC: 4.95 Mil/uL (ref 4.22–5.81)
RDW: 14.3 % (ref 11.5–15.5)
WBC: 6.4 10*3/uL (ref 4.0–10.5)

## 2019-03-24 LAB — TSH: TSH: 2.95 u[IU]/mL (ref 0.35–4.50)

## 2019-03-24 MED ORDER — HYDROCODONE-ACETAMINOPHEN 5-325 MG PO TABS: ORAL_TABLET | ORAL | 0 refills | Status: DC

## 2019-03-24 MED ORDER — CYCLOBENZAPRINE HCL 10 MG PO TABS: 10.0000 mg | ORAL_TABLET | Freq: Three times a day (TID) | ORAL | 1 refills | Status: DC | PRN

## 2019-03-24 NOTE — Progress Notes (Signed)
OFFICE VISIT  03/24/2019   CC:  Chief Complaint  Patient presents with  . Annual Exam    pt is fasting    HPI:    Patient is a 63 y.o. Caucasian male who presents for 6 mo f/u adult ADD. Kept med the same at the time of last visit.  Interim hx:  Adult ADD: Pt states all is going well with the med at current dosing: much improved focus, concentration, task completion.  Less frustration, better multitasking, less impulsivity and restlessness.  Mood is stable. No side effects from the medication.  New complaint: Having pain in lower back the last 3-4 mo, worse lying in certain positions and lying in bed trying to sleep. No radiation into buttocks or legs, no paresthesias.  No strain/injury/trauma recalled prior to onset.  He does play golf.  Takes naproxen daily, occ tylenol.  Past Medical History:  Diagnosis Date  . Acne    Dr. Tamala Julian (was on accutane at one point)  . Adult ADHD 11/2017  . Borderline hyperlipidemia   . BPH with obstruction/lower urinary tract symptoms 2015   finasteride trial 09/2015  . Bronchitis 10/06/2011  . Depression    Zoloft 2005  . Elevated blood pressure reading without diagnosis of hypertension   . Elevated PSA 2015   Bx benign 12/2014.  04/2016 prostate MRI negative. PSA stable at 4.29 as of 02/2018.  PSA doubled 08/2018-->13.2 02/2019->bx planned.  Marland Kitchen Episcleritis of right eye    w/mild scleritis right eye ([facility] opht 2011); also with hx of ocular varicella at age 55.  Marland Kitchen GERD (gastroesophageal reflux disease)    Tums  . Gout   . Hypogonadism male 2009  . Nephrolithiasis   . Osteoarthritis of left knee    Dr. Mayer Camel    Past Surgical History:  Procedure Laterality Date  . COLONOSCOPY     X 2, normal (for strong FH of colon cancer.  Last in approx 2010 was normal.  Repeat 10 yrs ([facility]).  Marland Kitchen FRACTURE SURGERY Left   . KNEE ARTHROSCOPY     Left: 1990s and early 2000's.  Marland Kitchen LITHOTRIPSY     1990s  . PROSTATE BIOPSY  12/2014   2016 benign.  Rpt  planned as of 02/2019.  Marland Kitchen ROTATOR CUFF REPAIR     Right  . THROAT SURGERY     Infected branchial cleft cyst 2007  . TOTAL KNEE ARTHROPLASTY Left 03/10/2016   Procedure: TOTAL KNEE ARTHROPLASTY;  Surgeon: Melrose Nakayama, MD;  Location: [facility];  Service: Orthopedics;  Laterality: Left;    Outpatient Medications Prior to Visit  Medication Sig Dispense Refill  . alclomethasone (ACLOVATE) 0.05 % ointment Apply 1 application topically daily as needed.    Marland Kitchen CLARAVIS 40 MG capsule Take 1 capsule by mouth daily.   0  . clotrimazole-betamethasone (LOTRISONE) cream APPLY TO AFFECTED AREA TWICE A DAY 30 g 3  . finasteride (PROSCAR) 5 MG tablet Take 5 mg by mouth daily.  11  . methylphenidate (RITALIN) 10 MG tablet 1 tab po q Afternoon as needed 90 tablet 0  . methylphenidate 54 MG PO CR tablet Take 1 tablet (54 mg total) by mouth every morning. 7 tablet 0  . NAPROXEN PO Take 1 tablet by mouth 2 (two) times daily. Pt takes once daily    . nystatin cream (MYCOSTATIN) Apply topically 2 (two) times daily. 30 g 2   No facility-administered medications prior to visit.     Allergies  Allergen Reactions  .  Sulfa Antibiotics Rash    ROS As per HPI  PE: Blood pressure 124/85, pulse 72, temperature 98.4 F (36.9 C), temperature source Temporal, resp. rate 16, height 6' (1.829 m), weight 270 lb 3.2 oz (122.6 kg), SpO2 95 %. Body mass index is 36.65 kg/m.  Pt states all is going well with the med at current dosing: much improved focus, concentration, task completion.  Less frustration, better multitasking, less impulsivity and restlessness.  Mood is stable. No side effects from the medication. BACK: ROM intact but some lower thoracic pain with rotation and lateral bending.   No tenderness to palpation.  Normal alignment. Sitting SLR neg bilat. LE strength 5/5 prox/dist bilat.  DTRs 1+ patellar bilat.  No achilles reflex on either side.  LABS:    Chemistry      Component Value Date/Time   NA 139  12/02/2017 0856   K 4.6 12/02/2017 0856   CL 105 12/02/2017 0856   CO2 27 12/02/2017 0856   BUN 16 12/02/2017 0856   CREATININE 0.93 12/02/2017 0856      Component Value Date/Time   CALCIUM 9.3 12/02/2017 0856   ALKPHOS 68 12/02/2017 0856   AST 23 12/02/2017 0856   ALT 31 12/02/2017 0856   BILITOT 0.5 12/02/2017 0856     IMPRESSION AND PLAN:   1) Adult ADD: The current medical regimen is effective;  continue present plan and medications. No new rx's needed today. PMP AWARE reviewed: no suspicious activity. CSC and UDS UTD.  Renew CSC at next office f/u.  2) Thoracic musculoskeletal back pain, bilat: subacute. Discussed use of vicodin 5/325, 1-2 tabs qhs prn pain to allow for some rest at night. Flexeril 10mg  tid prn. Heat application discussed. Referred pt for PT.  3)Prostate bx-->done this month, BENIGN per pt (he was called/notified by his urologist). Colonoscopy due 2020. Vaccines all UTD.  We did fasting HP labs today b/c I was told at the end of the visit by the nurse/lab that he was actually on the schedule as a CPE/health maintenance exam appt today.  An After Visit Summary was printed and given to the patient.  FOLLOW UP: Return in about 6 weeks (around 05/05/2019) for f/u back pain.  Signed:  Santiago BumpersPhil Delecia Vastine, MD           03/24/2019

## 2019-03-24 NOTE — Telephone Encounter (Signed)
MyChart message sent regarding appt.

## 2019-03-24 NOTE — Telephone Encounter (Signed)
Telephone message created from current MyChart message and sent to PCP.

## 2019-03-24 NOTE — Telephone Encounter (Signed)
Pt had appt today and there was a scheduling error on my part for the appt notes. He wanted to know if today's appt counted as his physical.  Please advise, thanks.

## 2019-03-24 NOTE — Telephone Encounter (Signed)
Whatever works out best for him. Does he want it to count as a physical?  (I did it as an ADHD f/u plus we discussed a new problem (back pain)->so the alternative would be that the visit is charged as a regular office visit for attention to specific problems rather than a "physical".).

## 2019-03-27 NOTE — Telephone Encounter (Signed)
Spoke with patient Friday afternoon and discussed options with him regarding appt. PCP notified and mychart message sent with options. Pt will respond via mychart.

## 2019-03-28 ENCOUNTER — Other Ambulatory Visit: Payer: Self-pay

## 2019-03-28 ENCOUNTER — Ambulatory Visit (INDEPENDENT_AMBULATORY_CARE_PROVIDER_SITE_OTHER): Payer: BC Managed Care – PPO | Admitting: Family Medicine

## 2019-03-28 ENCOUNTER — Encounter: Payer: Self-pay | Admitting: Family Medicine

## 2019-03-28 VITALS — BP 134/86 | HR 72 | Temp 98.4°F | Resp 16 | Ht 72.0 in | Wt 270.0 lb

## 2019-03-28 DIAGNOSIS — Z0000 Encounter for general adult medical examination without abnormal findings: Secondary | ICD-10-CM

## 2019-03-28 NOTE — Progress Notes (Signed)
This is an addendum to 03/24/19 visit. There was a scheduling mistake-->pt thought he was here that day for CPE but I did a f/u chronic illness visit. He returns today for his complete physical. BP 134/86 (BP Location: Left Arm, Patient Position: Sitting, Cuff Size: Large)   Pulse 72   Temp 98.4 F (36.9 C) (Temporal)   Resp 16   Ht 6' (1.829 m)   Wt 270 lb (122.5 kg)   SpO2 96%   BMI 36.62 kg/m  Gen: Alert, well appearing.  Patient is oriented to person, place, time, and situation. AFFECT: pleasant, lucid thought and speech. ENT: Ears: EACs clear, normal epithelium.  TMs with good light reflex and landmarks bilaterally.  Eyes: no injection, icteris, swelling, or exudate.  EOMI, PERRLA. Nose: no drainage or turbinate edema/swelling.  No injection or focal lesion.  Mouth: lips without lesion/swelling.  Oral mucosa pink and moist.  Dentition intact and without obvious caries or gingival swelling.  Oropharynx without erythema, exudate, or swelling.  Neck: supple/nontender.  No LAD, mass, or TM.  Carotid pulses 2+ bilaterally, without bruits. CV: RRR, no m/r/g.   LUNGS: CTA bilat, nonlabored resps, good aeration in all lung fields. ABD: soft, NT, ND, BS normal.  No hepatospenomegaly or mass.  No bruits. EXT: no clubbing, cyanosis, or edema.  Musculoskeletal: no joint swelling, erythema, warmth, or tenderness.  ROM of all joints intact. Skin - no sores or suspicious lesions or rashes or color changes  A/P Physical normal except obesity-->discussed wt loss strategies, goal of 1lb of wt loss per week. He has plans to contact his GI MD office to set up a repeat colonoscopy this year. Has plan in place for f/u elevated PSA/BPH with his urologist. Vaccines UTD. All fasting HP labs excellent 03/24/19. Will be starting PT soon for musculoskeletal LBP -->as per our plan from 03/24/19 visit.  Signed:  Crissie Sickles, MD           03/28/2019

## 2019-03-31 DIAGNOSIS — R972 Elevated prostate specific antigen [PSA]: Secondary | ICD-10-CM | POA: Diagnosis not present

## 2019-03-31 DIAGNOSIS — N401 Enlarged prostate with lower urinary tract symptoms: Secondary | ICD-10-CM | POA: Diagnosis not present

## 2019-03-31 DIAGNOSIS — R3912 Poor urinary stream: Secondary | ICD-10-CM | POA: Diagnosis not present

## 2019-04-04 DIAGNOSIS — M546 Pain in thoracic spine: Secondary | ICD-10-CM | POA: Diagnosis not present

## 2019-04-10 ENCOUNTER — Telehealth: Payer: Self-pay

## 2019-04-10 NOTE — Telephone Encounter (Signed)
Signed and put in box to go up front.  

## 2019-04-10 NOTE — Telephone Encounter (Signed)
Received initial PT evaluation and plan of care for patient. PCP will review and sign, if appropriate.

## 2019-04-11 DIAGNOSIS — M546 Pain in thoracic spine: Secondary | ICD-10-CM | POA: Diagnosis not present

## 2019-04-14 DIAGNOSIS — M546 Pain in thoracic spine: Secondary | ICD-10-CM | POA: Diagnosis not present

## 2019-04-18 DIAGNOSIS — M546 Pain in thoracic spine: Secondary | ICD-10-CM | POA: Diagnosis not present

## 2019-04-20 DIAGNOSIS — M546 Pain in thoracic spine: Secondary | ICD-10-CM | POA: Diagnosis not present

## 2019-05-10 ENCOUNTER — Encounter: Payer: Self-pay | Admitting: Family Medicine

## 2019-05-15 DIAGNOSIS — L738 Other specified follicular disorders: Secondary | ICD-10-CM | POA: Diagnosis not present

## 2019-05-15 DIAGNOSIS — L700 Acne vulgaris: Secondary | ICD-10-CM | POA: Diagnosis not present

## 2019-05-15 DIAGNOSIS — D225 Melanocytic nevi of trunk: Secondary | ICD-10-CM | POA: Diagnosis not present

## 2019-05-15 DIAGNOSIS — Z79899 Other long term (current) drug therapy: Secondary | ICD-10-CM | POA: Diagnosis not present

## 2019-05-16 ENCOUNTER — Ambulatory Visit: Payer: BC Managed Care – PPO | Admitting: Family Medicine

## 2019-05-17 ENCOUNTER — Encounter: Payer: Self-pay | Admitting: Family Medicine

## 2019-05-18 ENCOUNTER — Telehealth: Payer: Self-pay

## 2019-05-18 NOTE — Telephone Encounter (Signed)
MyChart message sent to patient: Good morning, The charges were entered on the 03/24/19 visit for your annual physical. The follow up visit on 03/28/19 was no charge. Your 03/20/19 visit was cancelled so there are no charges for that date. Thank you, Diane

## 2019-05-18 NOTE — Telephone Encounter (Signed)
Patient sent the following MyChart message on 9/16: I received a copay bill for a follow up visit on 7/31 for you to complete my yearly physical because your nurse did not inform you that my 7/27 visit was a physical. I was informed at that time that there would not be an additional charge.   Please see how we can fix this and notify the patient.

## 2019-05-18 NOTE — Telephone Encounter (Signed)
Dana/Diane this was sent to you guys back on 09/16. The patient sent another my chart message asking about it. Do we have any answers for him yet?  Please advise patient   Thanks

## 2019-05-30 DIAGNOSIS — N401 Enlarged prostate with lower urinary tract symptoms: Secondary | ICD-10-CM | POA: Diagnosis not present

## 2019-05-30 DIAGNOSIS — R972 Elevated prostate specific antigen [PSA]: Secondary | ICD-10-CM | POA: Diagnosis not present

## 2019-05-30 DIAGNOSIS — R3912 Poor urinary stream: Secondary | ICD-10-CM | POA: Diagnosis not present

## 2019-06-21 ENCOUNTER — Telehealth: Payer: Self-pay | Admitting: Family Medicine

## 2019-06-21 NOTE — Telephone Encounter (Signed)
Please send Rx for methylphenidate 54 MG PO CR tablet to [facility]. Patient only has 10 days left.

## 2019-06-22 MED ORDER — METHYLPHENIDATE HCL 10 MG PO TABS: ORAL_TABLET | ORAL | 0 refills | Status: DC

## 2019-06-22 MED ORDER — METHYLPHENIDATE HCL ER (OSM) 54 MG PO TBCR: 54.0000 mg | EXTENDED_RELEASE_TABLET | ORAL | 0 refills | Status: DC

## 2019-06-22 NOTE — Telephone Encounter (Signed)
Requesting: Methylphenidate Contract:03/18/18 UDS:10/11/18 Last Visit:03/24/19 CPE Next Visit: advised to f/u 6 weeks Last Refill: 03/13/19 (7,0) sent to [facility], 90 d supply sent to [facility].  Please Advise.

## 2019-06-22 NOTE — Telephone Encounter (Signed)
OK, eRx'd his methylphenidate extended release and his short acting methylphenidate.

## 2019-06-23 NOTE — Telephone Encounter (Signed)
mychart message sent to patient notifying of Rx being sent.

## 2019-07-04 ENCOUNTER — Encounter: Payer: Self-pay | Admitting: Family Medicine

## 2019-07-11 ENCOUNTER — Other Ambulatory Visit: Payer: Self-pay

## 2019-07-11 ENCOUNTER — Ambulatory Visit (INDEPENDENT_AMBULATORY_CARE_PROVIDER_SITE_OTHER): Payer: BC Managed Care – PPO

## 2019-07-11 DIAGNOSIS — Z23 Encounter for immunization: Secondary | ICD-10-CM

## 2019-07-25 DIAGNOSIS — A630 Anogenital (venereal) warts: Secondary | ICD-10-CM

## 2019-07-25 HISTORY — DX: Anogenital (venereal) warts: A63.0

## 2019-08-09 ENCOUNTER — Encounter: Payer: Self-pay | Admitting: Internal Medicine

## 2019-08-11 ENCOUNTER — Other Ambulatory Visit (HOSPITAL_COMMUNITY)
Admission: RE | Admit: 2019-08-11 | Discharge: 2019-08-11 | Disposition: A | Discharge status: Home / Self Care | Payer: BC Managed Care – PPO | Source: Ambulatory Visit | Attending: Family Medicine | Admitting: Family Medicine

## 2019-08-11 ENCOUNTER — Encounter: Payer: Self-pay | Admitting: Family Medicine

## 2019-08-11 ENCOUNTER — Other Ambulatory Visit: Payer: Self-pay

## 2019-08-11 ENCOUNTER — Ambulatory Visit: Payer: BC Managed Care – PPO | Admitting: Family Medicine

## 2019-08-11 VITALS — BP 164/99 | HR 83 | Temp 98.0°F | Resp 16 | Ht 72.0 in | Wt 274.6 lb

## 2019-08-11 DIAGNOSIS — L989 Disorder of the skin and subcutaneous tissue, unspecified: Secondary | ICD-10-CM | POA: Insufficient documentation

## 2019-08-11 DIAGNOSIS — L304 Erythema intertrigo: Secondary | ICD-10-CM

## 2019-08-11 DIAGNOSIS — R21 Rash and other nonspecific skin eruption: Secondary | ICD-10-CM | POA: Diagnosis not present

## 2019-08-11 DIAGNOSIS — A630 Anogenital (venereal) warts: Secondary | ICD-10-CM | POA: Diagnosis not present

## 2019-08-11 DIAGNOSIS — B356 Tinea cruris: Secondary | ICD-10-CM | POA: Diagnosis not present

## 2019-08-11 MED ORDER — KETOCONAZOLE 200 MG PO TABS: 200.0000 mg | ORAL_TABLET | Freq: Every day | ORAL | 0 refills | Status: DC

## 2019-08-11 MED ORDER — IMIQUIMOD 5 % EX CREA: TOPICAL_CREAM | CUTANEOUS | 2 refills | Status: DC

## 2019-08-11 NOTE — Progress Notes (Addendum)
OFFICE VISIT  08/11/2019   CC:  Chief Complaint  Patient presents with  . Rash    possible skin tags, groin area, and has an odor   HPI:    Patient is a 63 y.o. Caucasian male who presents for rash. On/off rash in groin area/creases for years, now having many more "skin tags" and they irritate him a lot.  Pinkish, foul smelling rash in creases of groin. Notes lots of moisture of the skin in the area.  Using nystatin cream daily the last couple weeks but no help.  He blow dries his GU area after showering.   No penile lesions, dysuria, penile d/c, or gross hematuria.  Past Medical History:  Diagnosis Date  . Acne    Dr. Katrinka Blazing (was on accutane at one point)  . Adult ADHD 11/2017  . Borderline hyperlipidemia   . BPH with obstruction/lower urinary tract symptoms 2015   finasteride trial 09/2015. Tamsulosin added 05/2019 urol->Surgical options discussed at that time.  . Bronchitis 10/06/2011  . Depression    Zoloft 2005  . Elevated blood pressure reading without diagnosis of hypertension   . Elevated PSA 2015   Bx benign 12/2014.  04/2016 prostate MRI negative. PSA stable at 4.29 as of 02/2018.  PSA doubled 08/2018-->13.2 02/2019->bx benign. PSA up 05/2019->surgical options discussed (for BPH).  Marland Kitchen Episcleritis of right eye    w/mild scleritis right eye ([facility] opht 2011); also with hx of ocular varicella at age 34.  Marland Kitchen GERD (gastroesophageal reflux disease)    Tums  . Gout   . Hypogonadism male 2009  . Nephrolithiasis   . Osteoarthritis of left knee    Dr. Turner Daniels    Past Surgical History:  Procedure Laterality Date  . COLONOSCOPY     X 2, normal (for strong FH of colon cancer.  Last in approx 2010 was normal.  Repeat 10 yrs ([facility]).  Marland Kitchen FRACTURE SURGERY Left   . KNEE ARTHROSCOPY     Left: 1990s and early 2000's.  Marland Kitchen LITHOTRIPSY     1990s  . PROSTATE BIOPSY  12/2014; 02/2019   2016 benign.  02/2019 benign.  Marland Kitchen ROTATOR CUFF REPAIR     Right  . THROAT SURGERY     Infected  branchial cleft cyst 2007  . TOTAL KNEE ARTHROPLASTY Left 03/10/2016   Procedure: TOTAL KNEE ARTHROPLASTY;  Surgeon: Marcene Corning, MD;  Location: [facility];  Service: Orthopedics;  Laterality: Left;    Outpatient Medications Prior to Visit  Medication Sig Dispense Refill  . CLARAVIS 40 MG capsule Take 1 capsule by mouth daily.   0  . finasteride (PROSCAR) 5 MG tablet Take 5 mg by mouth daily.  11  . methylphenidate (RITALIN) 10 MG tablet 1 tab po q Afternoon as needed 90 tablet 0  . methylphenidate 54 MG PO CR tablet Take 1 tablet (54 mg total) by mouth every morning. 90 tablet 0  . NAPROXEN PO Take 1 tablet by mouth 2 (two) times daily. Pt takes once daily    . nystatin cream (MYCOSTATIN) Apply topically 2 (two) times daily. 30 g 2  . alclomethasone (ACLOVATE) 0.05 % ointment Apply 1 application topically daily as needed.    . clotrimazole-betamethasone (LOTRISONE) cream APPLY TO AFFECTED AREA TWICE A DAY (Patient not taking: Reported on 08/11/2019) 30 g 3  . cyclobenzaprine (FLEXERIL) 10 MG tablet Take 1 tablet (10 mg total) by mouth 3 (three) times daily as needed for muscle spasms. (Patient not taking: Reported on 08/11/2019)  60 tablet 1  . HYDROcodone-acetaminophen (NORCO/VICODIN) 5-325 MG tablet 1-2 tabs po qhs prn pain (Patient not taking: Reported on 08/11/2019) 40 tablet 0   No facility-administered medications prior to visit.    Allergies  Allergen Reactions  . Sulfa Antibiotics Rash    ROS As per HPI  PE: Blood pressure (!) 164/99, pulse 83, temperature 98 F (36.7 C), temperature source Temporal, resp. rate 16, height 6' (1.829 m), weight 274 lb 9.6 oz (124.6 kg), SpO2 97 %. Gen: Alert, well appearing.  Patient is oriented to person, place, time, and situation. AFFECT: pleasant, lucid thought and speech. GU: in both groin creases there are mild pink/hyperpigmented areas of skin (confluent) and in the center of each groin crease there is a long line of verrucous-appearing  papules that are quite eryhematous.  No definite vesicles.  No pustules.  NO ulcerations.  Penis and testicles w/out rash or lesion.  LABS:    Chemistry      Component Value Date/Time   NA 140 03/24/2019 0837   K 4.5 03/24/2019 0837   CL 106 03/24/2019 0837   CO2 25 03/24/2019 0837   BUN 15 03/24/2019 0837   CREATININE 0.90 03/24/2019 0837      Component Value Date/Time   CALCIUM 9.4 03/24/2019 0837   ALKPHOS 74 03/24/2019 0837   AST 25 03/24/2019 0837   ALT 32 03/24/2019 0837   BILITOT 0.5 03/24/2019 0837     Lab Results  Component Value Date   WBC 6.4 03/24/2019   HGB 14.7 03/24/2019   HCT 43.7 03/24/2019   MCV 88.2 03/24/2019   PLT 276.0 03/24/2019   Lab Results  Component Value Date   TSH 2.95 03/24/2019   No results found for: HGBA1C Lab Results  Component Value Date   CHOL 165 03/24/2019   HDL 43.90 03/24/2019   LDLCALC 97 03/24/2019   TRIG 116.0 03/24/2019   CHOLHDL 4 03/24/2019    IMPRESSION AND PLAN:  1) Chronic groin rash, intertrigo suspected. Tinea cruris likely-->will do trial of ketoconazole 200 mg qd x 14d since this has been happening on/off for a couple years and topicals no help.  Continue to try to keep area as dry as possible.  2) Skin lesions: ?condylomata. Shave bx x 1 on upper aspect of L groin crease was done after obtaining pt consent. Lesion 6 mm in size.  Pt tolerated procedure well.  No immediate complications. Sent to path. Aldara cream three days per week x 12 weeks.  An After Visit Summary was printed and given to the patient.  FOLLOW UP: Return for 3-4 week f/u groin rash/lesions.  Signed:  Crissie Sickles, MD           08/11/2019

## 2019-08-14 ENCOUNTER — Telehealth: Payer: Self-pay | Admitting: Family Medicine

## 2019-08-14 NOTE — Telephone Encounter (Signed)
1 packet should be applied to cover both groin creases. He just should be using this three days a week (like M/W/F for example).

## 2019-08-14 NOTE — Telephone Encounter (Signed)
Patient was seen on Friday 12/18 by Dr. Anitra Lauth. Pt was prescribed imiquimod (ALDARA) 5 % cream Patient states cream in not enough to cover both areas where he has the rash.  Please advise.  Pt can be reached at [phone].

## 2019-08-14 NOTE — Telephone Encounter (Signed)
Rx was given for Imiquimod 5% cream (12 each, 2) on Friday 12/18. The packets are very small and are not like a tube. Do you feel like this is enough? He is using one whole package per day for the 2 areas.   Please advise, thanks.

## 2019-08-14 NOTE — Telephone Encounter (Signed)
Patient was advised of med directions for cream.

## 2019-08-16 LAB — SURGICAL PATHOLOGY

## 2019-08-23 ENCOUNTER — Other Ambulatory Visit: Payer: Self-pay | Admitting: Family Medicine

## 2019-08-23 DIAGNOSIS — L309 Dermatitis, unspecified: Secondary | ICD-10-CM

## 2019-08-23 MED ORDER — NYSTATIN 100000 UNIT/GM EX CREA: TOPICAL_CREAM | Freq: Two times a day (BID) | CUTANEOUS | 3 refills | Status: DC

## 2019-08-25 DIAGNOSIS — K5790 Diverticulosis of intestine, part unspecified, without perforation or abscess without bleeding: Secondary | ICD-10-CM

## 2019-08-25 DIAGNOSIS — U071 COVID-19: Secondary | ICD-10-CM

## 2019-08-25 HISTORY — DX: Diverticulosis of intestine, part unspecified, without perforation or abscess without bleeding: K57.90

## 2019-08-25 HISTORY — DX: COVID-19: U07.1

## 2019-08-26 DIAGNOSIS — R5383 Other fatigue: Secondary | ICD-10-CM | POA: Diagnosis not present

## 2019-08-26 DIAGNOSIS — R509 Fever, unspecified: Secondary | ICD-10-CM | POA: Diagnosis not present

## 2019-08-26 DIAGNOSIS — U071 COVID-19: Secondary | ICD-10-CM | POA: Diagnosis not present

## 2019-08-30 ENCOUNTER — Encounter: Payer: Self-pay | Admitting: Family Medicine

## 2019-08-30 NOTE — Telephone Encounter (Signed)
Please advise, thanks.

## 2019-08-31 ENCOUNTER — Other Ambulatory Visit: Payer: Self-pay | Admitting: Family Medicine

## 2019-08-31 MED ORDER — CEPHALEXIN 500 MG PO CAPS: 500.0000 mg | ORAL_CAPSULE | Freq: Three times a day (TID) | ORAL | 0 refills | Status: DC

## 2019-09-05 ENCOUNTER — Other Ambulatory Visit: Payer: Self-pay

## 2019-09-05 ENCOUNTER — Encounter: Payer: Self-pay | Admitting: Family Medicine

## 2019-09-05 ENCOUNTER — Ambulatory Visit (INDEPENDENT_AMBULATORY_CARE_PROVIDER_SITE_OTHER): Payer: BC Managed Care – PPO | Admitting: Family Medicine

## 2019-09-05 VITALS — BP 134/88

## 2019-09-05 DIAGNOSIS — A630 Anogenital (venereal) warts: Secondary | ICD-10-CM

## 2019-09-05 DIAGNOSIS — U071 COVID-19: Secondary | ICD-10-CM

## 2019-09-05 DIAGNOSIS — R21 Rash and other nonspecific skin eruption: Secondary | ICD-10-CM | POA: Diagnosis not present

## 2019-09-05 DIAGNOSIS — B3749 Other urogenital candidiasis: Secondary | ICD-10-CM

## 2019-09-05 MED ORDER — FLUCONAZOLE 150 MG PO TABS: ORAL_TABLET | ORAL | 0 refills | Status: DC

## 2019-09-05 NOTE — Progress Notes (Signed)
Virtual Visit via Video Note  I connected with Carlos Ellis on 09/05/19 at  8:00 AM EST by a video enabled telemedicine application and verified that I am speaking with the correct person using two identifiers.  Location patient: home Location provider:work or home office Persons participating in the virtual visit: patient, provider  I discussed the limitations of evaluation and management by telemedicine and the availability of in person appointments. The patient expressed understanding and agreed to proceed.  Telemedicine visit is a necessity given the COVID-19 restrictions in place at the current time.  HPI: 64 y/o WM being seen for 3 week f/u groin rash (intertrigo suspected) + condyloma acuminatum in groin creases. Has been on aldara cream.  Took 2 wk course of ketoconazole and also has been applying nystatin cream and otc zinc oxide preparation. Started keflex about 5 d/a for possible bacterial superinfection.  Interim hx: Covid 19 + 08/26/19-->had fever, fatigue, diarrhea, some cough.  He is improving now, no fever in last 4 days.  The warts are diminished significantly and he is happy with this. The red rash is still there, no odor like previous, occ itching. Still applying nystatin daily, nightly desitin.   ROS: See pertinent positives and negatives per HPI.  Past Medical History:  Diagnosis Date  . Acne    Dr. Katrinka Blazing (was on accutane at one point)  . Adult ADHD 11/2017  . Borderline hyperlipidemia   . BPH with obstruction/lower urinary tract symptoms 2015   finasteride trial 09/2015. Tamsulosin added 05/2019 urol->Surgical options discussed at that time.  . Bronchitis 10/06/2011  . Depression    Zoloft 2005  . Elevated blood pressure reading without diagnosis of hypertension   . Elevated PSA 2015   Bx benign 12/2014.  04/2016 prostate MRI negative. PSA stable at 4.29 as of 02/2018.  PSA doubled 08/2018-->13.2 02/2019->bx benign. PSA up 05/2019->surgical options discussed (for BPH).  Marland Kitchen  Episcleritis of right eye    w/mild scleritis right eye ([facility] opht 2011); also with hx of ocular varicella at age 45.  Marland Kitchen GERD (gastroesophageal reflux disease)    Tums  . Gout   . Hypogonadism male 2009  . Nephrolithiasis   . Osteoarthritis of left knee    Dr. Turner Daniels    Past Surgical History:  Procedure Laterality Date  . COLONOSCOPY     X 2, normal (for strong FH of colon cancer.  Last in approx 2010 was normal.  Repeat 10 yrs ([facility]).  Marland Kitchen FRACTURE SURGERY Left   . KNEE ARTHROSCOPY     Left: 1990s and early 2000's.  Marland Kitchen LITHOTRIPSY     1990s  . PROSTATE BIOPSY  12/2014; 02/2019   2016 benign.  02/2019 benign.  Marland Kitchen ROTATOR CUFF REPAIR     Right  . THROAT SURGERY     Infected branchial cleft cyst 2007  . TOTAL KNEE ARTHROPLASTY Left 03/10/2016   Procedure: TOTAL KNEE ARTHROPLASTY;  Surgeon: Marcene Corning, MD;  Location: [facility];  Service: Orthopedics;  Laterality: Left;    No family history on file.  Social History   Socioeconomic History  . Marital status: Married    Spouse name: Not on file  . Number of children: Not on file  . Years of education: Not on file  . Highest education level: Not on file  Occupational History  . Not on file  Tobacco Use  . Smoking status: Never Smoker  . Smokeless tobacco: Never Used  Substance and Sexual Activity  . Alcohol use: Yes  Alcohol/week: 3.0 standard drinks    Types: 3 Glasses of wine per week  . Drug use: No  . Sexual activity: Not on file  Other Topics Concern  . Not on file  Social History Narrative   Married, 2 teenage children.   Relocated to [place] from [place] 2007.   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   No tobacco.  Drinks 2-3 glasses of wine per week.  No drugs.   Walks his dog about 1 mile per day.   Social Determinants of Health   Financial Resource Strain:   . Difficulty of Paying Living Expenses: Not on file  Food Insecurity:   . Worried About  Programme researcher, broadcasting/film/video in the Last Year: Not on file  . Ran Out of Food in the Last Year: Not on file  Transportation Needs:   . Lack of Transportation (Medical): Not on file  . Lack of Transportation (Non-Medical): Not on file  Physical Activity:   . Days of Exercise per Week: Not on file  . Minutes of Exercise per Session: Not on file  Stress:   . Feeling of Stress : Not on file  Social Connections:   . Frequency of Communication with Friends and Family: Not on file  . Frequency of Social Gatherings with Friends and Family: Not on file  . Attends Religious Services: Not on file  . Active Member of Clubs or Organizations: Not on file  . Attends Banker Meetings: Not on file  . Marital Status: Not on file      Current Outpatient Medications:  .  clotrimazole-betamethasone (LOTRISONE) cream, APPLY TO AFFECTED AREA TWICE A DAY, Disp: 30 g, Rfl: 3 .  finasteride (PROSCAR) 5 MG tablet, Take 5 mg by mouth daily., Disp: , Rfl: 11 .  imiquimod (ALDARA) 5 % cream, Apply topically 3 (three) times a week., Disp: 12 each, Rfl: 2 .  methylphenidate (RITALIN) 10 MG tablet, 1 tab po q Afternoon as needed, Disp: 90 tablet, Rfl: 0 .  methylphenidate 54 MG PO CR tablet, Take 1 tablet (54 mg total) by mouth every morning., Disp: 90 tablet, Rfl: 0 .  NAPROXEN PO, Take 1 tablet by mouth 2 (two) times daily. Carlos Ellis takes once daily, Disp: , Rfl:  .  nystatin cream (MYCOSTATIN), Apply topically 2 (two) times daily., Disp: 30 g, Rfl: 3 .  alclomethasone (ACLOVATE) 0.05 % ointment, Apply 1 application topically daily as needed., Disp: , Rfl:  .  CLARAVIS 40 MG capsule, Take 1 capsule by mouth daily. , Disp: , Rfl: 0  EXAM:  VITALS per patient if applicable: BP 134/88 (BP Location: Left Arm, Patient Position: Sitting, Cuff Size: Large)    GENERAL: alert, oriented, appears well and in no acute distress SKIN: GU region w/out visible warts. Generalized GU deeply erythematous macular rash with  clearly demarcated borders and some possible satellite lesions. I don't see any maceration, streaking, or violaceous hue.  HEENT: atraumatic, conjunttiva clear, no obvious abnormalities on inspection of external nose and ears  NECK: normal movements of the head and neck  LUNGS: on inspection no signs of respiratory distress, breathing rate appears normal, no obvious gross SOB, gasping or wheezing  CV: no obvious cyanosis  MS: moves all visible extremities without noticeable abnormality  PSYCH/NEURO: pleasant and cooperative, no obvious depression or anxiety, speech and thought processing grossly intact  LABS: none today    Chemistry  Component Value Date/Time   NA 140 03/24/2019 0837   K 4.5 03/24/2019 0837   CL 106 03/24/2019 0837   CO2 25 03/24/2019 0837   BUN 15 03/24/2019 0837   CREATININE 0.90 03/24/2019 0837      Component Value Date/Time   CALCIUM 9.4 03/24/2019 0837   ALKPHOS 74 03/24/2019 0837   AST 25 03/24/2019 0837   ALT 32 03/24/2019 0837   BILITOT 0.5 03/24/2019 0837      ASSESSMENT AND PLAN:  Discussed the following assessment and plan:  1) Condyloma acuminatum: responding well to imiquimod.  Finish 3 month course.  2) GU rash: what appeared intertrigo/tinea-like initially now appears more clearly like candida. He needs to increase application of nystatin to tid-qid and continue zinc oxide ointment tid. I'll add 7 d course of diflucan 150mg  qd.  3) Covid 19 infection: improving.   He'll quarantine a full 14 days from the start of his illness.   I discussed the assessment and treatment plan with the patient. The patient was provided an opportunity to ask questions and all were answered. The patient agreed with the plan and demonstrated an understanding of the instructions.   The patient was advised to call back or seek an in-person evaluation if the symptoms worsen or if the condition fails to improve as anticipated.  F/u: if not improving  appropriately  Signed:  Crissie Sickles, MD           09/05/2019

## 2019-09-11 ENCOUNTER — Ambulatory Visit (AMBULATORY_SURGERY_CENTER): Payer: Self-pay | Admitting: *Deleted

## 2019-09-11 ENCOUNTER — Other Ambulatory Visit: Payer: Self-pay

## 2019-09-11 VITALS — Temp 97.6°F | Ht 72.0 in | Wt 268.0 lb

## 2019-09-11 DIAGNOSIS — Z1211 Encounter for screening for malignant neoplasm of colon: Secondary | ICD-10-CM

## 2019-09-11 MED ORDER — SUPREP BOWEL PREP KIT 17.5-3.13-1.6 GM/177ML PO SOLN: 1.0000 | Freq: Once | ORAL | 0 refills | Status: AC

## 2019-09-11 NOTE — Progress Notes (Signed)
Pt was dx'd with COVID 08-26-2019- pt states no fever since 08-30-2019 he is having Shortness of breathe but no other s/s at this time - pt states no quarantine at this time - was over 09-09-2019  No egg or soy allergy known to patient  No issues with past sedation with any surgeries  or procedures, no intubation problems  No diet pills per patient No home 02 use per patient  No blood thinners per patient  Pt denies issues with constipation  No A fib or A flutter  EMMI video sent to pt's e mail  Suprep $15 coupon to pt as well as suprep code to [facility] in script   Due to the COVID-19 pandemic we are asking patients to follow these guidelines. Please only bring one care partner. Please be aware that your care partner may wait in the car in the parking lot or if they feel like they will be too hot to wait in the car, they may wait in the lobby on the 4th floor. All care partners are required to wear a mask the entire time (we do not have any that we can provide them), they need to practice social distancing, and we will do a Covid check for all patient's and care partners when you arrive. Also we will check their temperature and your temperature. If the care partner waits in their car they need to stay in the parking lot the entire time and we will call them on their cell phone when the patient is ready for discharge so they can bring the car to the front of the building. Also all patient's will need to wear a mask into building.

## 2019-09-12 ENCOUNTER — Other Ambulatory Visit: Payer: Self-pay | Admitting: Family Medicine

## 2019-09-14 ENCOUNTER — Encounter: Payer: Self-pay | Admitting: Family Medicine

## 2019-09-14 ENCOUNTER — Other Ambulatory Visit: Payer: Self-pay

## 2019-09-15 ENCOUNTER — Encounter: Payer: Self-pay | Admitting: Family Medicine

## 2019-09-15 ENCOUNTER — Encounter: Payer: Self-pay | Admitting: Internal Medicine

## 2019-09-15 ENCOUNTER — Ambulatory Visit (INDEPENDENT_AMBULATORY_CARE_PROVIDER_SITE_OTHER): Payer: BC Managed Care – PPO | Admitting: Family Medicine

## 2019-09-15 VITALS — BP 134/88 | HR 89 | Temp 97.6°F | Resp 16 | Ht 72.0 in | Wt 264.8 lb

## 2019-09-15 DIAGNOSIS — R21 Rash and other nonspecific skin eruption: Secondary | ICD-10-CM | POA: Diagnosis not present

## 2019-09-15 MED ORDER — PREDNISONE 20 MG PO TABS: ORAL_TABLET | ORAL | 0 refills | Status: DC

## 2019-09-15 MED ORDER — MUPIROCIN 2 % EX OINT: 1.0000 "application " | TOPICAL_OINTMENT | Freq: Three times a day (TID) | CUTANEOUS | 1 refills | Status: DC

## 2019-09-15 NOTE — Patient Instructions (Signed)
Apply Aquafor ointment three times a day after each application of the antibacterial ointment (aquafor). Take prednisone as prescribed. Stop all other topical med application to the area. Diane is getting you an appt with Dr. Katrinka Blazing.

## 2019-09-15 NOTE — Progress Notes (Signed)
OFFICE VISIT  09/15/2019   CC:  Chief Complaint  Patient presents with  . Rash    located in groin area, used aldara and nystatin cream with no relief, still has odor, discomfort and oozing   HPI:    Patient is a 64 y.o. Caucasian male who presents for rash in groin area.  I've seen him a few times for this recently. Most recent f/u 09/05/19. A/P as of that visit: "1) Condyloma acuminatum: responding well to imiquimod.  Finish 3 month course.  2) GU rash: what appeared intertrigo/tinea-like initially now appears more clearly like candida. He needs to increase application of nystatin to tid-qid and continue zinc oxide ointment tid. I'll add 7 d course of diflucan 150mg  qd.  3) Covid 19 infection: improving.   He'll quarantine a full 14 days from the start of his illness."  Interim hx: Feels like his groin is more raw, seems like rash spreading up tops of groin creases into lower abd. No f/c/malaise. No rash anywhere else but has had cold sore on right side of lower lip lately.  Past Medical History:  Diagnosis Date  . Acne    Dr. Tamala Julian (was on accutane at one point)  . Adult ADHD 11/2017  . Borderline hyperlipidemia   . BPH with obstruction/lower urinary tract symptoms 2015   finasteride trial 09/2015. Tamsulosin added 05/2019 urol->Surgical options discussed at that time.  . Bronchitis 10/06/2011  . Condyloma acuminata 07/2019   responded well to imiquimod  . Depression    Zoloft 2005  . Elevated blood pressure reading without diagnosis of hypertension   . Elevated PSA 2015   Bx benign 12/2014.  04/2016 prostate MRI negative. PSA stable at 4.29 as of 02/2018.  PSA doubled 08/2018-->13.2 02/2019->bx benign. PSA up 05/2019->surgical options discussed (for BPH).  Marland Kitchen Episcleritis of right eye    w/mild scleritis right eye ([facility] opht 2011); also with hx of ocular varicella at age 89.  Marland Kitchen GERD (gastroesophageal reflux disease)    Tums  . Gout   . Hypogonadism male 2009  .  Nephrolithiasis   . Osteoarthritis of left knee    Dr. Mayer Camel    Past Surgical History:  Procedure Laterality Date  . COLONOSCOPY     X 2, normal (for strong FH of colon cancer.  Last in approx 2010 was normal.  Repeat 10 yrs ([facility]).  Marland Kitchen FRACTURE SURGERY Left 1974   wrist   . KNEE ARTHROSCOPY     Left: 1990s and early 2000's.  Marland Kitchen LITHOTRIPSY     1990s  . PROSTATE BIOPSY  12/2014; 02/2019   2016 benign.  02/2019 benign.  Marland Kitchen ROTATOR CUFF REPAIR     Right  . THROAT SURGERY     Infected branchial cleft cyst 2007  . TOTAL KNEE ARTHROPLASTY Left 03/10/2016   Procedure: TOTAL KNEE ARTHROPLASTY;  Surgeon: Melrose Nakayama, MD;  Location: [facility];  Service: Orthopedics;  Laterality: Left;    Outpatient Medications Prior to Visit  Medication Sig Dispense Refill  . alclomethasone (ACLOVATE) 0.05 % ointment Apply 1 application topically daily as needed.    . clotrimazole-betamethasone (LOTRISONE) cream APPLY TO AFFECTED AREA TWICE A DAY 30 g 3  . finasteride (PROSCAR) 5 MG tablet Take 5 mg by mouth daily.  11  . imiquimod (ALDARA) 5 % cream Apply topically 3 (three) times a week. 12 each 2  . methylphenidate (RITALIN) 10 MG tablet 1 tab po q Afternoon as needed 90 tablet 0  .  methylphenidate 54 MG PO CR tablet Take 1 tablet (54 mg total) by mouth every morning. 90 tablet 0  . NAPROXEN PO Take 1 tablet by mouth 2 (two) times daily. Pt takes once daily    . nystatin cream (MYCOSTATIN) Apply topically 2 (two) times daily. 30 g 3  . tamsulosin (FLOMAX) 0.4 MG CAPS capsule Take 0.4 mg by mouth.    . CLARAVIS 40 MG capsule Take 1 capsule by mouth daily.   0  . fluconazole (DIFLUCAN) 150 MG tablet 1 tab po qd x 7d (Patient not taking: Reported on 09/15/2019) 7 tablet 0   No facility-administered medications prior to visit.    Allergies  Allergen Reactions  . Sulfa Antibiotics Rash    ROS As per HPI  PE: Blood pressure 134/88, pulse 89, temperature 97.6 F (36.4 C), temperature source  Temporal, resp. rate 16, height 6' (1.829 m), weight 264 lb 12.8 oz (120.1 kg), SpO2 96 %. Body mass index is 35.91 kg/m.  Gen: Alert, well appearing.  Patient is oriented to person, place, time, and situation. AFFECT: pleasant, lucid thought and speech. Diffuse erythematous macular rash of groin, with tiny ulcerative spots scattered evenly over the entire rash region.  Area most involved is groin creases--minimally involves the scrotum, penis, peri-anal, or suprapubic regions at all.  +Macerated diffusely in the area of the rash.  Mild TTP.  I cannot palpate any groin adenopathy. Borders of the rash are well demarcated, not raised.  No papules, vesicles, pustules, or nodules.  LABS:    Chemistry      Component Value Date/Time   NA 140 03/24/2019 0837   K 4.5 03/24/2019 0837   CL 106 03/24/2019 0837   CO2 25 03/24/2019 0837   BUN 15 03/24/2019 0837   CREATININE 0.90 03/24/2019 0837      Component Value Date/Time   CALCIUM 9.4 03/24/2019 0837   ALKPHOS 74 03/24/2019 0837   AST 25 03/24/2019 0837   ALT 32 03/24/2019 0837   BILITOT 0.5 03/24/2019 0837      IMPRESSION AND PLAN:  Groin rash, persistent despite tx for tinea and candida pretty aggressively. Also has had aldara cream lately in the area to treat condyloma acuminatum (this helped all his condyloma resolve).  Has had multiple topical agents that could be contributing to inflammatory response->zinc oxide, nystatin.  Will treat (mostly preventatively) with bactroban ointment tid and have him apply aquafor ointment over this after each application.  Stop all other topical meds. Will treat this as possible inflammatory rxn to recent topical meds at this point: prednisone 40mg  qd x 5d, then 20mg  qd x 5d, then 10mg  qd x 6d. We have also arranged for him to see his dermatologist on Monday (09/18/19).  An After Visit Summary was printed and given to the patient.  FOLLOW UP: No follow-ups on file.  Signed:  , MD            09/15/2019

## 2019-09-18 ENCOUNTER — Ambulatory Visit: Payer: Self-pay | Admitting: Family Medicine

## 2019-09-18 DIAGNOSIS — X32XXXS Exposure to sunlight, sequela: Secondary | ICD-10-CM | POA: Diagnosis not present

## 2019-09-18 DIAGNOSIS — L304 Erythema intertrigo: Secondary | ICD-10-CM | POA: Diagnosis not present

## 2019-09-18 DIAGNOSIS — L568 Other specified acute skin changes due to ultraviolet radiation: Secondary | ICD-10-CM | POA: Diagnosis not present

## 2019-09-21 ENCOUNTER — Encounter: Payer: Self-pay | Admitting: Internal Medicine

## 2019-09-22 ENCOUNTER — Other Ambulatory Visit: Payer: Self-pay

## 2019-09-22 ENCOUNTER — Encounter: Payer: Self-pay | Admitting: Internal Medicine

## 2019-09-22 ENCOUNTER — Ambulatory Visit (AMBULATORY_SURGERY_CENTER): Payer: BC Managed Care – PPO | Admitting: Internal Medicine

## 2019-09-22 VITALS — BP 143/84 | HR 73 | Temp 97.1°F | Resp 14 | Ht 72.0 in | Wt 268.0 lb

## 2019-09-22 DIAGNOSIS — Z1211 Encounter for screening for malignant neoplasm of colon: Secondary | ICD-10-CM

## 2019-09-22 HISTORY — PX: COLONOSCOPY: SHX174

## 2019-09-22 MED ORDER — SODIUM CHLORIDE 0.9 % IV SOLN: 500.0000 mL | Freq: Once | INTRAVENOUS | Status: DC

## 2019-09-22 NOTE — Progress Notes (Signed)
No problems noted in the recovery room. maw 

## 2019-09-22 NOTE — Patient Instructions (Signed)
YOU HAD AN ENDOSCOPIC PROCEDURE TODAY AT [facility]:   Refer to the procedure report that was given to you for any specific questions about what was found during the examination.  If the procedure report does not answer your questions, please call your gastroenterologist to clarify.  If you requested that your care partner not be given the details of your procedure findings, then the procedure report has been included in a sealed envelope for you to review at your convenience later.  YOU SHOULD EXPECT: Some feelings of bloating in the abdomen. Passage of more gas than usual.  Walking can help get rid of the air that was put into your GI tract during the procedure and reduce the bloating. If you had a lower endoscopy (such as a colonoscopy or flexible sigmoidoscopy) you may notice spotting of blood in your stool or on the toilet paper. If you underwent a bowel prep for your procedure, you may not have a normal bowel movement for a few days.  Please Note:  You might notice some irritation and congestion in your nose or some drainage.  This is from the oxygen used during your procedure.  There is no need for concern and it should clear up in a day or so.  SYMPTOMS TO REPORT IMMEDIATELY:   Following lower endoscopy (colonoscopy or flexible sigmoidoscopy):  Excessive amounts of blood in the stool  Significant tenderness or worsening of abdominal pains  Swelling of the abdomen that is new, acute  Fever of 100F or higher    For urgent or emergent issues, a gastroenterologist can be reached at any hour by calling [phone].   DIET:  We do recommend a small meal at first, but then you may proceed to your regular diet.  Drink plenty of fluids but you should avoid alcoholic beverages for 24 hours.  ACTIVITY:  You should plan to take it easy for the rest of today and you should NOT DRIVE or use heavy machinery until tomorrow (because of the sedation medicines used during the test).     FOLLOW UP: Our staff will call the number listed on your records 48-72 hours following your procedure to check on you and address any questions or concerns that you may have regarding the information given to you following your procedure. If we do not reach you, we will leave a message.  We will attempt to reach you two times.  During this call, we will ask if you have developed any symptoms of COVID 19. If you develop any symptoms (ie: fever, flu-like symptoms, shortness of breath, cough etc.) before then, please call [phone].  If you test positive for Covid 19 in the 2 weeks post procedure, please call and report this information to [place].    If any biopsies were taken you will be contacted by phone or by letter within the next 1-3 weeks.  Please call us at [phone] if you have not heard about the biopsies in 3 weeks.    SIGNATURES/CONFIDENTIALITY: You and/or your care partner have signed paperwork which will be entered into your electronic medical record.  These signatures attest to the fact that that the information above on your After Visit Summary has been reviewed and is understood.  Full responsibility of the confidentiality of this discharge information lies with you and/or your care-partner.    Handouts were given to you on diverticulosis and hemorrhoids. You may resume your current medications today. Repeat screening colonoscopy in 10 years for  screening purposes. Please call if any questions or concerns.

## 2019-09-22 NOTE — Progress Notes (Signed)
Temp check by: [facility] Vital check by:DT  The patient states no changes in medical or surgical history since pre-visit screening on 09/11/19.

## 2019-09-22 NOTE — Op Note (Signed)
[facility] Patient Name: Carlos Ellis Procedure Date: 09/22/2019 2:09 PM MRN: [id number] Endoscopist: Beverley Fiedler , MD Age: 64 Referring MD:  Date of Birth: [birth year] Gender: Male Account #: [id number] Procedure:                Colonoscopy Indications:              Screening for colorectal malignant neoplasm, Last                            colonoscopy: 2009 Medicines:                Monitored Anesthesia Care Procedure:                Pre-Anesthesia Assessment:                           - Prior to the procedure, a History and Physical                            was performed, and patient medications and                            allergies were reviewed. The patient's tolerance of                            previous anesthesia was also reviewed. The risks                            and benefits of the procedure and the sedation                            options and risks were discussed with the patient.                            All questions were answered, and informed consent                            was obtained. Prior Anticoagulants: The patient has                            taken no previous anticoagulant or antiplatelet                            agents. ASA Grade Assessment: II - A patient with                            mild systemic disease. After reviewing the risks                            and benefits, the patient was deemed in                            satisfactory condition to undergo the procedure.  After obtaining informed consent, the colonoscope                            was passed under direct vision. Throughout the                            procedure, the patient's blood pressure, pulse, and                            oxygen saturations were monitored continuously. The                            Colonoscope was introduced through the anus and                            advanced to the cecum, identified by appendiceal                             orifice and ileocecal valve. The colonoscopy was                            performed without difficulty. The patient tolerated                            the procedure well. The quality of the bowel                            preparation was good. The ileocecal valve,                            appendiceal orifice, and rectum were photographed. Scope In: 2:17:07 PM Scope Out: 2:30:07 PM Scope Withdrawal Time: 0 hours 10 minutes 32 seconds  Total Procedure Duration: 0 hours 13 minutes 0 seconds  Findings:                 The digital rectal exam was normal.                           Multiple medium-mouthed diverticula were found in                            the sigmoid colon and descending colon.                           Internal hemorrhoids were found during                            retroflexion. The hemorrhoids were small.                           The exam was otherwise without abnormality. Complications:            No immediate complications. Estimated Blood Loss:     Estimated blood loss: none. Impression:               - Diverticulosis in the  sigmoid colon and in the                            descending colon.                           - Samll internal hemorrhoids.                           - The examination was otherwise normal.                           - No specimens collected. Recommendation:           - Patient has a contact number available for                            emergencies. The signs and symptoms of potential                            delayed complications were discussed with the                            patient. Return to normal activities tomorrow.                            Written discharge instructions were provided to the                            patient.                           - Resume previous diet.                           - Continue present medications.                           - Repeat colonoscopy in 10 years for  screening                            purposes. Beverley Fiedler, MD 09/22/2019 2:32:39 PM This report has been signed electronically.

## 2019-09-22 NOTE — Progress Notes (Signed)
To PACU, VSS. Report to [email or web address] 

## 2019-09-26 ENCOUNTER — Telehealth: Payer: Self-pay

## 2019-09-26 NOTE — Telephone Encounter (Signed)
  Follow up Call-  Call back number 09/22/2019  Post procedure Call Back phone  # [phone]  Permission to leave phone message Yes  Some recent data might be hidden     Patient questions:  Do you have a fever, pain , or abdominal swelling? No. Pain Score  0 *  Have you tolerated food without any problems? Yes.    Have you been able to return to your normal activities? Yes.    Do you have any questions about your discharge instructions: Diet   No. Medications  No. Follow up visit  No.  Do you have questions or concerns about your Care? No.  Actions: * If pain score is 4 or above: No action needed, pain <4.    1. Have you developed a fever since your procedure? No  2.   Have you had an respiratory symptoms (SOB or cough) since your procedure? No  3.   Have you tested positive for COVID 19 since your procedure? No  4.   Have you had any family members/close contacts diagnosed with the COVID 19 since your procedure?  No   If yes to any of these questions please route to Laverna Peace, RN and Jennye Boroughs, RN.

## 2019-09-27 ENCOUNTER — Encounter: Payer: Self-pay | Admitting: Family Medicine

## 2019-10-04 ENCOUNTER — Encounter: Payer: Self-pay | Admitting: Family Medicine

## 2019-10-04 ENCOUNTER — Other Ambulatory Visit: Payer: Self-pay | Admitting: Family Medicine

## 2019-10-04 NOTE — Telephone Encounter (Signed)
Duplicate request.  Mychart message sent to patient requesting him to tell us where he would like RX sent.  [facility] was [facility].  

## 2019-10-04 NOTE — Telephone Encounter (Signed)
Patient requesting refill for methylphenidate 54mg  to go to [facility] pharmacy.  Last OV 09/15/19 No upcoming appointment. Last RX 06/22/2019.  Okay to refill?

## 2019-10-04 NOTE — Telephone Encounter (Signed)
FYI  Please see below

## 2019-10-04 NOTE — Telephone Encounter (Signed)
Duplicate request.  Mychart message sent to patient requesting him to tell us where he would like RX sent.  [facility] was [facility] from [facility].

## 2019-10-05 MED ORDER — METHYLPHENIDATE HCL ER (OSM) 54 MG PO TBCR: 54.0000 mg | EXTENDED_RELEASE_TABLET | ORAL | 0 refills | Status: DC

## 2019-10-09 DIAGNOSIS — L304 Erythema intertrigo: Secondary | ICD-10-CM | POA: Diagnosis not present

## 2019-10-09 DIAGNOSIS — L700 Acne vulgaris: Secondary | ICD-10-CM | POA: Diagnosis not present

## 2019-11-20 DIAGNOSIS — L650 Telogen effluvium: Secondary | ICD-10-CM | POA: Diagnosis not present

## 2019-11-20 DIAGNOSIS — Z79899 Other long term (current) drug therapy: Secondary | ICD-10-CM | POA: Diagnosis not present

## 2019-11-20 DIAGNOSIS — L304 Erythema intertrigo: Secondary | ICD-10-CM | POA: Diagnosis not present

## 2019-11-20 DIAGNOSIS — L700 Acne vulgaris: Secondary | ICD-10-CM | POA: Diagnosis not present

## 2019-11-23 ENCOUNTER — Ambulatory Visit: Payer: BC Managed Care – PPO | Attending: Internal Medicine

## 2019-11-23 DIAGNOSIS — Z23 Encounter for immunization: Secondary | ICD-10-CM

## 2019-11-23 NOTE — Progress Notes (Signed)
   Covid-19 Vaccination Clinic  Name:  Jamone Garrido    MRN: [id number] DOB: [birth year]  11/23/2019  Mr. Yero was observed post Covid-19 immunization for 15 minutes without incident. He was provided with Vaccine Information Sheet and instruction to access the V-Safe system.   Mr. Watling was instructed to call 911 with any severe reactions post vaccine: Marland Kitchen Difficulty breathing  . Swelling of face and throat  . A fast heartbeat  . A bad rash all over body  . Dizziness and weakness   Immunizations Administered    Name Date Dose VIS Date Route   Pfizer COVID-19 Vaccine 11/23/2019  3:52 PM 0.3 mL 08/04/2019 Intramuscular   Manufacturer: [employer], [employer]   Lot: [id number]   NDC: [address]-8

## 2019-12-19 ENCOUNTER — Ambulatory Visit: Payer: BC Managed Care – PPO | Attending: Internal Medicine

## 2019-12-19 DIAGNOSIS — Z23 Encounter for immunization: Secondary | ICD-10-CM

## 2019-12-19 NOTE — Progress Notes (Signed)
   Covid-19 Vaccination Clinic  Name:  Carlos Ellis    MRN: [id number] DOB: 1956/04/05  12/19/2019  [name] was observed post Covid-19 immunization for 15 minutes without incident. He was provided with Vaccine Information Sheet and instruction to access the V-Safe system.   [name] was instructed to call 911 with any severe reactions post vaccine: Marland Kitchen Difficulty breathing  . Swelling of face and throat  . A fast heartbeat  . A bad rash all over body  . Dizziness and weakness   Immunizations Administered    Name Date Dose VIS Date Route   Pfizer COVID-19 Vaccine 12/19/2019  8:44 AM 0.3 mL 10/18/2018 Intramuscular   Manufacturer: [employer], [employer]   Lot: [id number]   NDC: [address]-1

## 2020-01-09 ENCOUNTER — Other Ambulatory Visit: Payer: Self-pay | Admitting: Family Medicine

## 2020-01-11 NOTE — Telephone Encounter (Signed)
Requesting: methylphenidate Contract:03/18/18 UDS:10/11/18 Last Visit:09/15/19, acute Next Visit:n/a Last Refill:10/05/19(90,0)  Please Advise. Medication pending

## 2020-01-15 MED ORDER — METHYLPHENIDATE HCL ER (OSM) 54 MG PO TBCR: 54.0000 mg | EXTENDED_RELEASE_TABLET | ORAL | 0 refills | Status: DC

## 2020-01-15 NOTE — Telephone Encounter (Signed)
MyChart message read.

## 2020-01-15 NOTE — Telephone Encounter (Signed)
Ok but pt needs f/u prior to any FURTHER RF's.

## 2020-01-25 DIAGNOSIS — L720 Epidermal cyst: Secondary | ICD-10-CM | POA: Diagnosis not present

## 2020-03-01 DIAGNOSIS — Z96652 Presence of left artificial knee joint: Secondary | ICD-10-CM | POA: Diagnosis not present

## 2020-03-01 DIAGNOSIS — M1711 Unilateral primary osteoarthritis, right knee: Secondary | ICD-10-CM | POA: Diagnosis not present

## 2020-04-17 ENCOUNTER — Other Ambulatory Visit: Payer: Self-pay | Admitting: Family Medicine

## 2020-04-17 MED ORDER — METHYLPHENIDATE HCL ER (OSM) 54 MG PO TBCR: 54.0000 mg | EXTENDED_RELEASE_TABLET | ORAL | 0 refills | Status: DC

## 2020-04-17 MED ORDER — METHYLPHENIDATE HCL 10 MG PO TABS: ORAL_TABLET | ORAL | 0 refills | Status: DC

## 2020-04-17 NOTE — Telephone Encounter (Signed)
Patient scheduled CPE appt on 05/10/20 with Dr. Milinda Cave  When and IF refills are approved, and refills are completed, please call patient with update. [phone]  Thank you

## 2020-04-17 NOTE — Telephone Encounter (Signed)
Patient advised refills sent. 

## 2020-04-17 NOTE — Telephone Encounter (Signed)
Requesting:Ritalin Contract:n/a UDS:10/11/18 Last Visit:09/15/19 Next Visit:05/10/20 Last Refill:06/22/19(90,0)  Requesting: Methylphenidate Contract:n/a UDS:10/11/18 Last Visit:09/15/19 Next Visit:05/10/20 Last Refill:01/15/20(90,0)  Please Advise. Medications pending

## 2020-05-08 ENCOUNTER — Other Ambulatory Visit: Payer: Self-pay

## 2020-05-10 ENCOUNTER — Other Ambulatory Visit (HOSPITAL_COMMUNITY)
Admission: RE | Admit: 2020-05-10 | Discharge: 2020-05-10 | Disposition: A | Discharge status: Home / Self Care | Payer: BC Managed Care – PPO | Source: Ambulatory Visit | Attending: Family Medicine | Admitting: Family Medicine

## 2020-05-10 ENCOUNTER — Encounter: Payer: Self-pay | Admitting: Family Medicine

## 2020-05-10 ENCOUNTER — Ambulatory Visit (INDEPENDENT_AMBULATORY_CARE_PROVIDER_SITE_OTHER): Payer: BC Managed Care – PPO | Admitting: Family Medicine

## 2020-05-10 ENCOUNTER — Other Ambulatory Visit: Payer: Self-pay

## 2020-05-10 VITALS — BP 143/90 | HR 75 | Temp 98.1°F | Ht 71.5 in | Wt 256.6 lb

## 2020-05-10 DIAGNOSIS — E785 Hyperlipidemia, unspecified: Secondary | ICD-10-CM | POA: Diagnosis not present

## 2020-05-10 DIAGNOSIS — R030 Elevated blood-pressure reading, without diagnosis of hypertension: Secondary | ICD-10-CM | POA: Diagnosis not present

## 2020-05-10 DIAGNOSIS — Z125 Encounter for screening for malignant neoplasm of prostate: Secondary | ICD-10-CM

## 2020-05-10 DIAGNOSIS — Z113 Encounter for screening for infections with a predominantly sexual mode of transmission: Secondary | ICD-10-CM | POA: Insufficient documentation

## 2020-05-10 DIAGNOSIS — Z23 Encounter for immunization: Secondary | ICD-10-CM

## 2020-05-10 DIAGNOSIS — F988 Other specified behavioral and emotional disorders with onset usually occurring in childhood and adolescence: Secondary | ICD-10-CM | POA: Diagnosis not present

## 2020-05-10 DIAGNOSIS — Z0000 Encounter for general adult medical examination without abnormal findings: Secondary | ICD-10-CM | POA: Diagnosis not present

## 2020-05-10 LAB — CBC WITH DIFFERENTIAL/PLATELET
Basophils Absolute: 0 10*3/uL (ref 0.0–0.1)
Basophils Relative: 0.5 % (ref 0.0–3.0)
Eosinophils Absolute: 0.3 10*3/uL (ref 0.0–0.7)
Eosinophils Relative: 3.6 % (ref 0.0–5.0)
HCT: 44 % (ref 39.0–52.0)
Hemoglobin: 14.9 g/dL (ref 13.0–17.0)
Lymphocytes Relative: 15.9 % (ref 12.0–46.0)
Lymphs Abs: 1.2 10*3/uL (ref 0.7–4.0)
MCHC: 33.8 g/dL (ref 30.0–36.0)
MCV: 88.7 fl (ref 78.0–100.0)
Monocytes Absolute: 0.5 10*3/uL (ref 0.1–1.0)
Monocytes Relative: 7 % (ref 3.0–12.0)
Neutro Abs: 5.4 10*3/uL (ref 1.4–7.7)
Neutrophils Relative %: 73 % (ref 43.0–77.0)
Platelets: 243 10*3/uL (ref 150.0–400.0)
RBC: 4.96 Mil/uL (ref 4.22–5.81)
RDW: 14 % (ref 11.5–15.5)
WBC: 7.4 10*3/uL (ref 4.0–10.5)

## 2020-05-10 LAB — LIPID PANEL
Cholesterol: 173 mg/dL (ref 0–200)
HDL: 47.3 mg/dL (ref >39.00)
LDL Cholesterol: 103 mg/dL — ABNORMAL HIGH (ref 0–99)
NonHDL: 125.7
Total CHOL/HDL Ratio: 4
Triglycerides: 114 mg/dL (ref 0.0–149.0)
VLDL: 22.8 mg/dL (ref 0.0–40.0)

## 2020-05-10 LAB — COMPREHENSIVE METABOLIC PANEL
ALT: 26 U/L (ref 0–53)
AST: 22 U/L (ref 0–37)
Albumin: 4.4 g/dL (ref 3.5–5.2)
Alkaline Phosphatase: 79 U/L (ref 39–117)
BUN: 15 mg/dL (ref 6–23)
CO2: 27 mEq/L (ref 19–32)
Calcium: 9.3 mg/dL (ref 8.4–10.5)
Chloride: 105 mEq/L (ref 96–112)
Creatinine, Ser: 0.91 mg/dL (ref 0.40–1.50)
GFR: 84.3 mL/min (ref >60.00)
Glucose, Bld: 89 mg/dL (ref 70–99)
Potassium: 4.2 mEq/L (ref 3.5–5.1)
Sodium: 140 mEq/L (ref 135–145)
Total Bilirubin: 0.5 mg/dL (ref 0.2–1.2)
Total Protein: 6.6 g/dL (ref 6.0–8.3)

## 2020-05-10 LAB — PSA: PSA: 7.27 ng/mL — ABNORMAL HIGH (ref 0.10–4.00)

## 2020-05-10 LAB — TSH: TSH: 4.09 u[IU]/mL (ref 0.35–4.50)

## 2020-05-10 NOTE — Patient Instructions (Addendum)
Check your blood pressure and heart rate a few times a week. Your average top number should be <130 and average bottom number should be <80. If either top number or bottom number is above this average then make appt for return visit to review with me.   Health Maintenance, Male Adopting a healthy lifestyle and getting preventive care are important in promoting health and wellness. Ask your health care provider about:  The right schedule for you to have regular tests and exams.  Things you can do on your own to prevent diseases and keep yourself healthy. What should I know about diet, weight, and exercise? Eat a healthy diet   Eat a diet that includes plenty of vegetables, fruits, low-fat dairy products, and lean protein.  Do not eat a lot of foods that are high in solid fats, added sugars, or sodium. Maintain a healthy weight Body mass index (BMI) is a measurement that can be used to identify possible weight problems. It estimates body fat based on height and weight. Your health care provider can help determine your BMI and help you achieve or maintain a healthy weight. Get regular exercise Get regular exercise. This is one of the most important things you can do for your health. Most adults should:  Exercise for at least 150 minutes each week. The exercise should increase your heart rate and make you sweat (moderate-intensity exercise).  Do strengthening exercises at least twice a week. This is in addition to the moderate-intensity exercise.  Spend less time sitting. Even light physical activity can be beneficial. Watch cholesterol and blood lipids Have your blood tested for lipids and cholesterol at 62 years of age, then have this test every 5 years. You may need to have your cholesterol levels checked more often if:  Your lipid or cholesterol levels are high.  You are older than 62 years of age.  You are at high risk for heart disease. What should I know about cancer  screening? Many types of cancers can be detected early and may often be prevented. Depending on your health history and family history, you may need to have cancer screening at various ages. This may include screening for:  Colorectal cancer.  Prostate cancer.  Skin cancer.  Lung cancer. What should I know about heart disease, diabetes, and high blood pressure? Blood pressure and heart disease  High blood pressure causes heart disease and increases the risk of stroke. This is more likely to develop in people who have high blood pressure readings, are of African descent, or are overweight.  Talk with your health care provider about your target blood pressure readings.  Have your blood pressure checked: ? Every 3-5 years if you are 2-61 years of age. ? Every year if you are 54 years old or older.  If you are between the ages of 20 and 29 and are a current or former smoker, ask your health care provider if you should have a one-time screening for abdominal aortic aneurysm (AAA). Diabetes Have regular diabetes screenings. This checks your fasting blood sugar level. Have the screening done:  Once every three years after age 27 if you are at a normal weight and have a low risk for diabetes.  More often and at a younger age if you are overweight or have a high risk for diabetes. What should I know about preventing infection? Hepatitis B If you have a higher risk for hepatitis B, you should be screened for this virus. Talk with your  health care provider to find out if you are at risk for hepatitis B infection. Hepatitis C Blood testing is recommended for:  Everyone born from [birth year] through 1963.  Anyone with known risk factors for hepatitis C. Sexually transmitted infections (STIs)  You should be screened each year for STIs, including gonorrhea and chlamydia, if: ? You are sexually active and are younger than 62 years of age. ? You are older than 62 years of age and your health care  provider tells you that you are at risk for this type of infection. ? Your sexual activity has changed since you were last screened, and you are at increased risk for chlamydia or gonorrhea. Ask your health care provider if you are at risk.  Ask your health care provider about whether you are at high risk for HIV. Your health care provider may recommend a prescription medicine to help prevent HIV infection. If you choose to take medicine to prevent HIV, you should first get tested for HIV. You should then be tested every 3 months for as long as you are taking the medicine. Follow these instructions at home: Lifestyle  Do not use any products that contain nicotine or tobacco, such as cigarettes, e-cigarettes, and chewing tobacco. If you need help quitting, ask your health care provider.  Do not use street drugs.  Do not share needles.  Ask your health care provider for help if you need support or information about quitting drugs. Alcohol use  Do not drink alcohol if your health care provider tells you not to drink.  If you drink alcohol: ? Limit how much you have to 0-2 drinks a day. ? Be aware of how much alcohol is in your drink. In the [place] one drink equals one 12 oz bottle of beer (355 mL), one 5 oz glass of wine (148 mL), or one 1 oz glass of hard liquor (44 mL). General instructions  Schedule regular health, dental, and eye exams.  Stay current with your vaccines.  Tell your health care provider if: ? You often feel depressed. ? You have ever been abused or do not feel safe at home. Summary  Adopting a healthy lifestyle and getting preventive care are important in promoting health and wellness.  Follow your health care provider's instructions about healthy diet, exercising, and getting tested or screened for diseases.  Follow your health care provider's instructions on monitoring your cholesterol and blood pressure. This information is not intended to replace advice given  to you by your health care provider. Make sure you discuss any questions you have with your health care provider. Document Revised: 08/03/2018 Document Reviewed: 08/03/2018 Elsevier Patient Education  2020 [employer].

## 2020-05-10 NOTE — Progress Notes (Signed)
Office Note 05/10/2020  CC:  Chief Complaint  Patient presents with  . Annual Exam    pt is fasting    HPI:  Carlos Ellis is a 64 y.o. White male who is here for annual health maintenance exam and f/u adult ADD.  BP: review of EMR shows a fair # of systolics 301T and diastolics 14H, many normals as well. Occ bp's at other MD visits 120-140 range. No home monitoring.  Has been working some on TLC, has lost 8 lbs in 6 mo.  Pt states all is going well with the med at current dosing: much improved focus, concentration, task completion.  Less frustration, better multitasking, less impulsivity and restlessness.  Mood is stable. No side effects from the medication.  PMP AWARE reviewed today: most recent rx for methylphenidateER 36m was filled 04/25/20, # 947 rx by me.   Most recent rx for methylphenidate "plain" 1106mwas filled 04/28/20, #90, rx by me. No red flags.  His genital warts have cleared up after seeing derm.  Past Medical History:  Diagnosis Date  . Acne    Dr. SmTamala Julianwas on accutane at one point)  . Adult ADHD 11/2017  . Borderline hyperlipidemia   . BPH with obstruction/lower urinary tract symptoms 2015   finasteride trial 09/2015. Tamsulosin added 05/2019 urol->Surgical options discussed at that time.  . Bronchitis 10/06/2011  . Condyloma acuminata 07/2019   responded well to imiquimod  . Depression    Zoloft 2005  . Diverticulosis 08/2019   Noted on screening colonoscopy  . Elevated blood pressure reading without diagnosis of hypertension   . Elevated PSA 2015   Bx benign 12/2014.  04/2016 prostate MRI negative. PSA stable at 4.29 as of 02/2018.  PSA doubled 08/2018-->13.2 02/2019->bx benign. PSA up 05/2019->surgical options discussed (for BPH).  . Marland Kitchenpiscleritis of right eye    w/mild scleritis right eye ([facility] opht 2011); also with hx of ocular varicella at age 64 . Marland KitchenERD (gastroesophageal reflux disease)    Tums  . Gout   . Hypogonadism male 2009  .  Nephrolithiasis   . Osteoarthritis of left knee    Dr. RoMayer Camel  Past Surgical History:  Procedure Laterality Date  . COLONOSCOPY  09/22/2019   X 2, normal (for strong FH of colon cancer.  Last in approx 2010 was normal.  Rpt 08/2019 NO POLYPS->repeat 10 yrs  . FRACTURE SURGERY Left 1974   wrist   . KNEE ARTHROSCOPY     Left: 1990s and early 2000's.  . Marland KitchenITHOTRIPSY     1990s  . PROSTATE BIOPSY  12/2014; 02/2019   2016 benign.  02/2019 benign.  . Marland KitchenOTATOR CUFF REPAIR     Right  . THROAT SURGERY     Infected branchial cleft cyst 2007  . TOTAL KNEE ARTHROPLASTY Left 03/10/2016   Procedure: TOTAL KNEE ARTHROPLASTY;  Surgeon: PeMelrose NakayamaMD;  [facility];  Laterality: Left;    Family History  Problem Relation Age of Onset  . Cancer Paternal Grandfather        either prostate, colon or something in that area - not definitive dx of cancer   . Multiple myeloma Brother   . Colon cancer Neg Hx   . Colon polyps Neg Hx   . Esophageal cancer Neg Hx   . Rectal cancer Neg Hx   . Stomach cancer Neg Hx     Social History   Socioeconomic History  . Marital status: Married  Spouse name: Not on file  . Number of children: Not on file  . Years of education: Not on file  . Highest education level: Not on file  Occupational History  . Not on file  Tobacco Use  . Smoking status: Never Smoker  . Smokeless tobacco: Never Used  Vaping Use  . Vaping Use: Never used  Substance and Sexual Activity  . Alcohol use: Yes    Alcohol/week: 3.0 standard drinks    Types: 3 Glasses of wine per week  . Drug use: No  . Sexual activity: Not on file  Other Topics Concern  . Not on file  Social History Narrative   Married, 2 teenage children.   Relocated to [place] from [place] 2007.   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   No tobacco.  Drinks 2-3 glasses of wine per week.  No drugs.   Walks his dog about 1 mile  per day.   Social Determinants of Health   Financial Resource Strain:   . Difficulty of Paying Living Expenses: Not on file  Food Insecurity:   . Worried About Charity fundraiser in the Last Year: Not on file  . Ran Out of Food in the Last Year: Not on file  Transportation Needs:   . Lack of Transportation (Medical): Not on file  . Lack of Transportation (Non-Medical): Not on file  Physical Activity:   . Days of Exercise per Week: Not on file  . Minutes of Exercise per Session: Not on file  Stress:   . Feeling of Stress : Not on file  Social Connections:   . Frequency of Communication with Friends and Family: Not on file  . Frequency of Social Gatherings with Friends and Family: Not on file  . Attends Religious Services: Not on file  . Active Member of Clubs or Organizations: Not on file  . Attends Archivist Meetings: Not on file  . Marital Status: Not on file  Intimate Partner Violence:   . Fear of Current or Ex-Partner: Not on file  . Emotionally Abused: Not on file  . Physically Abused: Not on file  . Sexually Abused: Not on file    Outpatient Medications Prior to Visit  Medication Sig Dispense Refill  . methylphenidate (RITALIN) 10 MG tablet 1 tab po q Afternoon as needed 90 tablet 0  . methylphenidate 54 MG PO CR tablet Take 1 tablet (54 mg total) by mouth every morning. 90 tablet 0  . naproxen (NAPROSYN) 500 MG tablet Take 500 mg by mouth 2 (two) times daily.    . tamsulosin (FLOMAX) 0.4 MG CAPS capsule Take 0.4 mg by mouth.    . finasteride (PROSCAR) 5 MG tablet Take 5 mg by mouth daily. (Patient not taking: Reported on 05/10/2020)  11  . alclomethasone (ACLOVATE) 0.05 % ointment Apply 1 application topically daily as needed.    Marland Kitchen CLARAVIS 40 MG capsule Take 1 capsule by mouth daily.   0  . clotrimazole-betamethasone (LOTRISONE) cream APPLY TO AFFECTED AREA TWICE A DAY 30 g 3  . imiquimod (ALDARA) 5 % cream Apply topically 3 (three) times a week. 12 each 2   . mupirocin ointment (BACTROBAN) 2 % Apply 1 application topically 3 (three) times daily. 30 g 1  . nystatin cream (MYCOSTATIN) Apply topically 2 (two) times daily. 30 g 3  . predniSONE (DELTASONE) 20 MG tablet 2 tabs po qd x 5d, then 1  tab po qd x 5d, then 1/2 tab po qd x 6d 18 tablet 0   Facility-Administered Medications Prior to Visit  Medication Dose Route Frequency Provider Last Rate Last Admin  . 0.9 %  sodium chloride infusion  500 mL Intravenous Once Pyrtle, Lajuan Lines, MD        Allergies  Allergen Reactions  . Sulfa Antibiotics Rash    ROS Review of Systems  Constitutional: Negative for appetite change, chills, fatigue and fever.  HENT: Negative for congestion, dental problem, ear pain and sore throat.   Eyes: Negative for discharge, redness and visual disturbance.  Respiratory: Negative for cough, chest tightness, shortness of breath and wheezing.   Cardiovascular: Negative for chest pain, palpitations and leg swelling.  Gastrointestinal: Negative for abdominal pain, blood in stool, diarrhea, nausea and vomiting.  Genitourinary: Negative for difficulty urinating, dysuria, flank pain, frequency, hematuria and urgency.  Musculoskeletal: Negative for arthralgias, back pain, joint swelling, myalgias and neck stiffness.  Skin: Negative for pallor and rash.  Neurological: Negative for dizziness, speech difficulty, weakness and headaches.  Hematological: Negative for adenopathy. Does not bruise/bleed easily.  Psychiatric/Behavioral: Negative for confusion and sleep disturbance. The patient is not nervous/anxious.     PE; Vitals with BMI 05/10/2020 09/22/2019 09/22/2019  Height 5' 11.5" - -  Weight 256 lbs 10 oz - -  BMI 06.30 - -  Systolic 160 109 323  Diastolic 90 84 85  Pulse 75 73 73     Gen: Alert, well appearing.  Patient is oriented to person, place, time, and situation. AFFECT: pleasant, lucid thought and speech. ENT: Ears: EACs clear, normal epithelium.  TMs with good  light reflex and landmarks bilaterally.  Eyes: no injection, icteris, swelling, or exudate.  EOMI, PERRLA. Nose: no drainage or turbinate edema/swelling.  No injection or focal lesion.  Mouth: lips without lesion/swelling.  Oral mucosa pink and moist.  Dentition intact and without obvious caries or gingival swelling.  Oropharynx without erythema, exudate, or swelling.  Neck: supple/nontender.  No LAD, mass, or TM.  Carotid pulses 2+ bilaterally, without bruits. CV: RRR, no m/r/g.   LUNGS: CTA bilat, nonlabored resps, good aeration in all lung fields. ABD: soft, NT, ND, BS normal.  No hepatospenomegaly or mass.  No bruits. EXT: no clubbing, cyanosis, or edema.  Musculoskeletal: no joint swelling, erythema, warmth, or tenderness.  ROM of all joints intact. Skin - no sores or suspicious lesions or rashes or color changes   Pertinent labs:  Lab Results  Component Value Date   TSH 2.95 03/24/2019   Lab Results  Component Value Date   WBC 6.4 03/24/2019   HGB 14.7 03/24/2019   HCT 43.7 03/24/2019   MCV 88.2 03/24/2019   PLT 276.0 03/24/2019   Lab Results  Component Value Date   CREATININE 0.90 03/24/2019   BUN 15 03/24/2019   NA 140 03/24/2019   K 4.5 03/24/2019   CL 106 03/24/2019   CO2 25 03/24/2019   Lab Results  Component Value Date   ALT 32 03/24/2019   AST 25 03/24/2019   ALKPHOS 74 03/24/2019   BILITOT 0.5 03/24/2019   Lab Results  Component Value Date   CHOL 165 03/24/2019   Lab Results  Component Value Date   HDL 43.90 03/24/2019   Lab Results  Component Value Date   LDLCALC 97 03/24/2019   Lab Results  Component Value Date   TRIG 116.0 03/24/2019   Lab Results  Component Value Date   CHOLHDL 4  03/24/2019   Lab Results  Component Value Date   PSA 13.20 02/28/2019   PSA 8.97 01/02/2019   PSA 5.98 06/23/2018    ASSESSMENT AND PLAN:   Health maintenance exam: Reviewed age and gender appropriate health maintenance issues (prudent diet, regular  exercise, health risks of tobacco and excessive alcohol, use of seatbelts, fire alarms in home, use of sunscreen).  Also reviewed age and gender appropriate health screening as well as vaccine recommendations. Vaccines: ALL UTD.  Flu-->given today. Labs: fasting HP labs ordered. Prostate ca screening: hx of elev PSA, bx benign, BPH suspected---followed by urol. He is unsure of when they last checked PSA so he asked that I check it today. Colon ca screening: recall 2031  STD screening: he has hx of genital warts that derm treated successfully. He would like screening for HIV, syphilis, GC, chlamydia so I ordered these today.  Elevated bp w/out dx htn: suspect he has essential HTN but we decided to get a decent amount of home data to go on prior to officially making this dx and starting antihypertensive. Instructions: Check your blood pressure and heart rate a few times a week. Your average top number should be <130 and average bottom number should be <80. If either top number or bottom number is above this average then make appt for return visit to review with me.  Adult ADD: stable, doing well on current regimen. No new rx's needed today. Update CSC today.  An After Visit Summary was printed and given to the patient.  FOLLOW UP:  Return in about 1 year (around 05/10/2021) for annual CPE (fasting).  Signed:  Crissie Sickles, MD           05/10/2020

## 2020-05-13 LAB — HIV ANTIBODY (ROUTINE TESTING W REFLEX): HIV 1&2 Ab, 4th Generation: NONREACTIVE

## 2020-05-13 LAB — RPR: RPR Ser Ql: NONREACTIVE

## 2020-05-13 LAB — URINE CYTOLOGY ANCILLARY ONLY
Chlamydia: NEGATIVE
Comment: NEGATIVE
Comment: NORMAL
Neisseria Gonorrhea: NEGATIVE

## 2020-05-29 DIAGNOSIS — D1801 Hemangioma of skin and subcutaneous tissue: Secondary | ICD-10-CM | POA: Diagnosis not present

## 2020-05-29 DIAGNOSIS — L304 Erythema intertrigo: Secondary | ICD-10-CM | POA: Diagnosis not present

## 2020-05-29 DIAGNOSIS — L821 Other seborrheic keratosis: Secondary | ICD-10-CM | POA: Diagnosis not present

## 2020-05-29 DIAGNOSIS — L700 Acne vulgaris: Secondary | ICD-10-CM | POA: Diagnosis not present

## 2020-06-08 DIAGNOSIS — Z20822 Contact with and (suspected) exposure to covid-19: Secondary | ICD-10-CM | POA: Diagnosis not present

## 2020-07-06 ENCOUNTER — Ambulatory Visit: Payer: BC Managed Care – PPO | Attending: Internal Medicine

## 2020-07-06 DIAGNOSIS — Z23 Encounter for immunization: Secondary | ICD-10-CM

## 2020-07-06 NOTE — Progress Notes (Signed)
   Covid-19 Vaccination Clinic  Name:  Carlos Ellis    MRN: [id number] DOB: [birth year]  07/06/2020  Carlos Ellis was observed post Covid-19 immunization for 15 minutes without incident. He was provided with Vaccine Information Sheet and instruction to access the V-Safe system.   Carlos Ellis was instructed to call 911 with any severe reactions post vaccine: Marland Kitchen Difficulty breathing  . Swelling of face and throat  . A fast heartbeat  . A bad rash all over body  . Dizziness and weakness

## 2020-07-08 DIAGNOSIS — N401 Enlarged prostate with lower urinary tract symptoms: Secondary | ICD-10-CM | POA: Diagnosis not present

## 2020-07-08 DIAGNOSIS — R972 Elevated prostate specific antigen [PSA]: Secondary | ICD-10-CM | POA: Diagnosis not present

## 2020-07-08 DIAGNOSIS — R310 Gross hematuria: Secondary | ICD-10-CM | POA: Diagnosis not present

## 2020-07-08 DIAGNOSIS — R3912 Poor urinary stream: Secondary | ICD-10-CM | POA: Diagnosis not present

## 2020-07-26 ENCOUNTER — Other Ambulatory Visit: Payer: Self-pay | Admitting: Family Medicine

## 2020-07-26 DIAGNOSIS — N200 Calculus of kidney: Secondary | ICD-10-CM | POA: Diagnosis not present

## 2020-07-26 DIAGNOSIS — R310 Gross hematuria: Secondary | ICD-10-CM | POA: Diagnosis not present

## 2020-07-26 DIAGNOSIS — N210 Calculus in bladder: Secondary | ICD-10-CM | POA: Diagnosis not present

## 2020-07-26 DIAGNOSIS — K8020 Calculus of gallbladder without cholecystitis without obstruction: Secondary | ICD-10-CM | POA: Diagnosis not present

## 2020-07-26 NOTE — Telephone Encounter (Signed)
Requesting: methylphenidate tablet Contract:05/10/20 UDS:10/11/2018 Last Visit:05/10/20 Next Visit:05/16/21 Last Refill:04/17/20 (90,0)  Please Advise

## 2020-07-28 MED ORDER — METHYLPHENIDATE HCL ER (OSM) 54 MG PO TBCR
54.0000 mg | EXTENDED_RELEASE_TABLET | ORAL | 0 refills | Status: DC
Start: 2020-07-28 — End: 2020-11-10

## 2020-07-28 MED ORDER — METHYLPHENIDATE HCL 10 MG PO TABS
ORAL_TABLET | ORAL | 0 refills | Status: DC
Start: 2020-07-28 — End: 2021-05-16

## 2020-07-28 NOTE — Telephone Encounter (Signed)
OK, methylphenidate eRx'd. Pt needs f/u in 3 mo.

## 2020-07-29 NOTE — Telephone Encounter (Signed)
Pt scheduled for Feb 21, 22

## 2020-08-13 DIAGNOSIS — D485 Neoplasm of uncertain behavior of skin: Secondary | ICD-10-CM | POA: Diagnosis not present

## 2020-08-13 DIAGNOSIS — L720 Epidermal cyst: Secondary | ICD-10-CM | POA: Diagnosis not present

## 2020-08-19 DIAGNOSIS — X58XXXA Exposure to other specified factors, initial encounter: Secondary | ICD-10-CM | POA: Diagnosis not present

## 2020-08-19 DIAGNOSIS — S76311A Strain of muscle, fascia and tendon of the posterior muscle group at thigh level, right thigh, initial encounter: Secondary | ICD-10-CM | POA: Diagnosis not present

## 2020-08-21 DIAGNOSIS — M79651 Pain in right thigh: Secondary | ICD-10-CM | POA: Diagnosis not present

## 2020-08-21 DIAGNOSIS — M6281 Muscle weakness (generalized): Secondary | ICD-10-CM | POA: Diagnosis not present

## 2020-08-27 ENCOUNTER — Encounter: Payer: Self-pay | Admitting: Family Medicine

## 2020-08-28 DIAGNOSIS — M6281 Muscle weakness (generalized): Secondary | ICD-10-CM | POA: Diagnosis not present

## 2020-08-28 DIAGNOSIS — M79651 Pain in right thigh: Secondary | ICD-10-CM | POA: Diagnosis not present

## 2020-09-04 DIAGNOSIS — M6281 Muscle weakness (generalized): Secondary | ICD-10-CM | POA: Diagnosis not present

## 2020-09-04 DIAGNOSIS — M79651 Pain in right thigh: Secondary | ICD-10-CM | POA: Diagnosis not present

## 2020-09-18 DIAGNOSIS — M6281 Muscle weakness (generalized): Secondary | ICD-10-CM | POA: Diagnosis not present

## 2020-09-18 DIAGNOSIS — M79651 Pain in right thigh: Secondary | ICD-10-CM | POA: Diagnosis not present

## 2020-09-27 DIAGNOSIS — R3912 Poor urinary stream: Secondary | ICD-10-CM | POA: Diagnosis not present

## 2020-09-27 DIAGNOSIS — N401 Enlarged prostate with lower urinary tract symptoms: Secondary | ICD-10-CM | POA: Diagnosis not present

## 2020-09-27 DIAGNOSIS — R310 Gross hematuria: Secondary | ICD-10-CM | POA: Diagnosis not present

## 2020-09-27 DIAGNOSIS — R972 Elevated prostate specific antigen [PSA]: Secondary | ICD-10-CM | POA: Diagnosis not present

## 2020-09-27 LAB — PSA
PSA: 8.32
PSA: 8.32

## 2020-10-02 DIAGNOSIS — M79651 Pain in right thigh: Secondary | ICD-10-CM | POA: Diagnosis not present

## 2020-10-02 DIAGNOSIS — M6281 Muscle weakness (generalized): Secondary | ICD-10-CM | POA: Diagnosis not present

## 2020-10-03 ENCOUNTER — Other Ambulatory Visit: Payer: Self-pay | Admitting: Urology

## 2020-10-08 ENCOUNTER — Encounter: Payer: Self-pay | Admitting: Family Medicine

## 2020-10-10 DIAGNOSIS — M79651 Pain in right thigh: Secondary | ICD-10-CM | POA: Diagnosis not present

## 2020-10-10 DIAGNOSIS — M6281 Muscle weakness (generalized): Secondary | ICD-10-CM | POA: Diagnosis not present

## 2020-10-14 ENCOUNTER — Other Ambulatory Visit: Payer: Self-pay

## 2020-10-14 ENCOUNTER — Encounter: Payer: Self-pay | Admitting: Family Medicine

## 2020-10-14 ENCOUNTER — Ambulatory Visit: Payer: BC Managed Care – PPO | Admitting: Family Medicine

## 2020-10-14 VITALS — BP 143/82 | HR 78 | Temp 97.9°F | Resp 16 | Ht 71.5 in | Wt 268.0 lb

## 2020-10-14 DIAGNOSIS — I10 Essential (primary) hypertension: Secondary | ICD-10-CM

## 2020-10-14 DIAGNOSIS — F988 Other specified behavioral and emotional disorders with onset usually occurring in childhood and adolescence: Secondary | ICD-10-CM | POA: Diagnosis not present

## 2020-10-14 MED ORDER — AMLODIPINE BESYLATE 5 MG PO TABS: 5.0000 mg | ORAL_TABLET | Freq: Every day | ORAL | 0 refills | Status: DC

## 2020-10-14 NOTE — Progress Notes (Signed)
OFFICE VISIT  10/14/2020  CC:  Chief Complaint  Carlos Ellis presents with  . Follow-up    RCI, pt is not fasting    HPI:    Carlos Ellis is a 65 y.o. Caucasian male who presents for 5 mo f/u adult ADD and elevated bp w/out dx HTN. A/P as of last visit: "Health maintenance exam: Reviewed age and gender appropriate health maintenance issues (prudent diet, regular exercise, health risks of tobacco and excessive alcohol, use of seatbelts, fire alarms in home, use of sunscreen).  Also reviewed age and gender appropriate health screening as well as vaccine recommendations. Vaccines: ALL UTD.  Flu-->given today. Labs: fasting HP labs ordered. Prostate ca screening: hx of elev PSA, bx benign, BPH suspected---followed by urol. He is unsure of when they last checked PSA so he asked that I check it today. Colon ca screening: recall 2031  STD screening: he has hx of genital warts that derm treated successfully. He would like screening for HIV, syphilis, GC, chlamydia so I ordered these today.  Elevated bp w/out dx htn: suspect he has essential HTN but we decided to get a decent amount of home data to go on prior to officially making this dx and starting antihypertensive Instructions: Check your blood pressure and heart rate a few times a week. Your average top number should be <130 and average bottom number should be <80. If either top number or bottom number is above this average then make appt for return visit to review with me.  Adult ADD: stable, doing well on current regimen. No new rx's needed today. Update CSC today."  INTERIM HX: Doing fine, no acute complaints today. Has been going through PT/rehab for R leg injury sustained x-mas night 2021 when he caught his father in law to keep him from falling (saw ortho in [place], where he has recently moved to downsize).  Pt states all is going well with the med at current dosing: much improved focus, concentration, task completion.  Less  frustration, better multitasking, less impulsivity and restlessness.  Mood is stable. No side effects from the medication.  Urol found bladder stones and he is set up for litholapexy and he'll get urolift for his BPH with LUTS at that time as well (11/01/20).  BP: 130-140 over upper 80s.  PMP AWARE reviewed today: most recent rx for methylphenidate 54mg  was filled 08/04/20, # 90, rx by me. Most recent methylphenidate 10mg  filled 08/04/20, #90, rx by me. No red flags.   Past Medical History:  Diagnosis Date  . Acne    Dr. (was on accutane at one point)  . Adult ADHD 11/2017  . Borderline hyperlipidemia   . BPH with obstruction/lower urinary tract symptoms 2015   finasteride trial 09/2015. Tamsulosin added 05/2019 urol->Surgical options discussed at that time.  . Bronchitis 10/06/2011  . Condyloma acuminata 07/2019   responded well to imiquimod  . Depression    Zoloft 2005  . Diverticulosis 08/2019   Noted on screening colonoscopy  . Elevated blood pressure reading without diagnosis of hypertension   . Elevated PSA 2015   Bx benign 12/2014.  04/2016 prostate MRI negative. PSA stable at 4.29 as of 02/2018.  PSA doubled 08/2018-->13.2 02/2019->rpt bx benign. PSAs followed by 03/2018.  05-26-2000 Episcleritis of right eye    w/mild scleritis right eye ([facility] opht 2011); also with hx of ocular varicella at age 58.  . Gallstones 2021/2022   noted on imaging done via urologist for w/u of his gross hematuria  .  GERD (gastroesophageal reflux disease)    Tums  . Gout   . Gross hematuria summer 2021   urol->w/u-> Bladder stones. pt to get cystolithopaxy as of 09/27/20  . Hypogonadism male 2009  . Nephrolithiasis   . Osteoarthritis of left knee    Dr. Turner Daniels    Past Surgical History:  Procedure Laterality Date  . COLONOSCOPY  09/22/2019   X 2, normal (for strong FH of colon cancer.  Last in approx 2010 was normal.  Rpt 08/2019 NO POLYPS->repeat 10 yrs  . FRACTURE SURGERY Left 1974   wrist   . KNEE  ARTHROSCOPY     Left: 1990s and early 2000's.  Marland Kitchen LITHOTRIPSY     1990s  . PROSTATE BIOPSY  12/2014; 02/2019   2016 benign.  02/2019 benign.  Marland Kitchen ROTATOR CUFF REPAIR     Right  . THROAT SURGERY     Infected branchial cleft cyst 2007  . TOTAL KNEE ARTHROPLASTY Left 03/10/2016   Procedure: TOTAL KNEE ARTHROPLASTY;  Surgeon: Marcene Corning, MD;  Location: [facility];  Service: Orthopedics;  Laterality: Left;    Outpatient Medications Prior to Visit  Medication Sig Dispense Refill  . finasteride (PROSCAR) 5 MG tablet Take 5 mg by mouth daily.  11  . methylphenidate (RITALIN) 10 MG tablet 1 tab po q Afternoon as needed 90 tablet 0  . methylphenidate 54 MG PO CR tablet Take 1 tablet (54 mg total) by mouth every morning. 90 tablet 0  . naproxen (NAPROSYN) 500 MG tablet Take 500 mg by mouth 2 (two) times daily.    . tamsulosin (FLOMAX) 0.4 MG CAPS capsule Take 0.4 mg by mouth.     Facility-Administered Medications Prior to Visit  Medication Dose Route Frequency Provider Last Rate Last Admin  . 0.9 %  sodium chloride infusion  500 mL Intravenous Once Pyrtle, Carie Caddy, MD        Allergies  Allergen Reactions  . Sulfa Antibiotics Rash    ROS As per HPI  PE: Vitals with BMI 10/14/2020 05/10/2020 09/22/2019  Height 5' 11.5" 5' 11.5" -  Weight 268 lbs 256 lbs 10 oz -  BMI 36.86 35.29 -  Systolic 143 143 388  Diastolic 82 90 84  Pulse 78 75 73     Wt Readings from Last 2 Encounters:  10/14/20 268 lb (121.6 kg)  05/10/20 256 lb 9.6 oz (116.4 kg)    Gen: alert, oriented x 4, affect pleasant.  Lucid thinking and conversation noted. HEENT: PERRLA, EOMI.   Neck: no LAD, mass, or thyromegaly. CV: RRR, no m/r/g LUNGS: CTA bilat, nonlabored. NEURO: no tremor or tics noted on observation.  Coordination intact. CN 2-12 grossly intact bilaterally, strength 5/5 in all extremeties.  No ataxia. EXT: no clubbing or cyanosis.  No pitting edema.    LABS:  Lab Results  Component Value Date   TSH 4.09  05/10/2020   Lab Results  Component Value Date   WBC 7.4 05/10/2020   HGB 14.9 05/10/2020   HCT 44.0 05/10/2020   MCV 88.7 05/10/2020   PLT 243.0 05/10/2020   Lab Results  Component Value Date   CREATININE 0.91 05/10/2020   BUN 15 05/10/2020   NA 140 05/10/2020   K 4.2 05/10/2020   CL 105 05/10/2020   CO2 27 05/10/2020   Lab Results  Component Value Date   ALT 26 05/10/2020   AST 22 05/10/2020   ALKPHOS 79 05/10/2020   BILITOT 0.5 05/10/2020   Lab Results  Component Value Date   CHOL 173 05/10/2020   Lab Results  Component Value Date   HDL 47.30 05/10/2020   Lab Results  Component Value Date   LDLCALC 103 (H) 05/10/2020   Lab Results  Component Value Date   TRIG 114.0 05/10/2020   Lab Results  Component Value Date   CHOLHDL 4 05/10/2020   Lab Results  Component Value Date   PSA 7.27 (H) 05/10/2020   PSA 13.20 02/28/2019   PSA 8.97 01/02/2019   IMPRESSION AND PLAN:  1) HTN, time to start med. Start amlodipine 5mg , monitor bp/hr at home and return to go over these with me in 2-3 wks. Therapeutic expectations and side effect profile of medication discussed today.  Carlos Ellis's questions answered.  2) Adult ADD: doing well on methylphenidate ER 54mg  qd and most days takes a 10mg  IR methylphenidate around 3 pm. Will get CSC at next f/u in 2-3 wks. No new rx's needed today.  An After Visit Summary was printed and given to the Carlos Ellis.  FOLLOW UP: Return for 2-3 wks f/u HTN.  Signed:  , MD           10/14/2020

## 2020-10-14 NOTE — Patient Instructions (Signed)
Check your blood pressure and heart rate once daily and write the numbers down for review with me at follow up visit in 2-3 weeks.

## 2020-10-17 ENCOUNTER — Other Ambulatory Visit: Payer: Self-pay | Admitting: Urology

## 2020-10-21 DIAGNOSIS — L304 Erythema intertrigo: Secondary | ICD-10-CM | POA: Diagnosis not present

## 2020-10-21 DIAGNOSIS — L738 Other specified follicular disorders: Secondary | ICD-10-CM | POA: Diagnosis not present

## 2020-10-22 DIAGNOSIS — N401 Enlarged prostate with lower urinary tract symptoms: Secondary | ICD-10-CM | POA: Diagnosis not present

## 2020-10-22 DIAGNOSIS — R3912 Poor urinary stream: Secondary | ICD-10-CM | POA: Diagnosis not present

## 2020-10-24 DIAGNOSIS — M6281 Muscle weakness (generalized): Secondary | ICD-10-CM | POA: Diagnosis not present

## 2020-10-24 DIAGNOSIS — M79651 Pain in right thigh: Secondary | ICD-10-CM | POA: Diagnosis not present

## 2020-10-28 ENCOUNTER — Other Ambulatory Visit: Payer: Self-pay

## 2020-10-28 ENCOUNTER — Encounter (HOSPITAL_BASED_OUTPATIENT_CLINIC_OR_DEPARTMENT_OTHER): Payer: Self-pay | Admitting: Urology

## 2020-10-28 ENCOUNTER — Ambulatory Visit: Payer: BC Managed Care – PPO | Admitting: Family Medicine

## 2020-10-28 DIAGNOSIS — L989 Disorder of the skin and subcutaneous tissue, unspecified: Secondary | ICD-10-CM

## 2020-10-28 HISTORY — DX: Disorder of the skin and subcutaneous tissue, unspecified: L98.9

## 2020-10-28 NOTE — Progress Notes (Signed)
Spoke w/ via phone for pre-op interview---pt Lab needs dos----I stat , ekg               Lab results------none COVID test ------10-29-2020 900 Arrive at -------530 am 11-01-2020 NPO after MN NO Solid Food.  Clear liquids from MN until---430 am Medications to take morning of surgery -----amlodipine, finasteride, tamsulosin Diabetic medication ----n/a- Patient Special Instructions -----none Pre-Op special Istructions -----none Patient verbalized understanding of instructions that were given at this phone interview. Patient denies shortness of breath, chest pain, fever, cough at this phone interview.

## 2020-10-29 ENCOUNTER — Other Ambulatory Visit (HOSPITAL_COMMUNITY)
Admission: RE | Admit: 2020-10-29 | Discharge: 2020-10-29 | Disposition: A | Discharge status: Home / Self Care | Payer: BC Managed Care – PPO | Source: Ambulatory Visit | Attending: Urology | Admitting: Urology

## 2020-10-29 DIAGNOSIS — R3912 Poor urinary stream: Secondary | ICD-10-CM | POA: Diagnosis not present

## 2020-10-29 DIAGNOSIS — Z01812 Encounter for preprocedural laboratory examination: Secondary | ICD-10-CM | POA: Insufficient documentation

## 2020-10-29 DIAGNOSIS — Z882 Allergy status to sulfonamides status: Secondary | ICD-10-CM | POA: Diagnosis not present

## 2020-10-29 DIAGNOSIS — Z20822 Contact with and (suspected) exposure to covid-19: Secondary | ICD-10-CM | POA: Insufficient documentation

## 2020-10-29 DIAGNOSIS — Z791 Long term (current) use of non-steroidal anti-inflammatories (NSAID): Secondary | ICD-10-CM | POA: Diagnosis not present

## 2020-10-29 DIAGNOSIS — N210 Calculus in bladder: Secondary | ICD-10-CM | POA: Diagnosis not present

## 2020-10-29 DIAGNOSIS — N401 Enlarged prostate with lower urinary tract symptoms: Secondary | ICD-10-CM | POA: Diagnosis not present

## 2020-10-29 DIAGNOSIS — Z808 Family history of malignant neoplasm of other organs or systems: Secondary | ICD-10-CM | POA: Diagnosis not present

## 2020-10-29 DIAGNOSIS — Z79899 Other long term (current) drug therapy: Secondary | ICD-10-CM | POA: Diagnosis not present

## 2020-10-29 DIAGNOSIS — Z8616 Personal history of COVID-19: Secondary | ICD-10-CM | POA: Diagnosis not present

## 2020-10-29 DIAGNOSIS — Z96652 Presence of left artificial knee joint: Secondary | ICD-10-CM | POA: Diagnosis not present

## 2020-10-29 LAB — SARS CORONAVIRUS 2 (TAT 6-24 HRS): SARS Coronavirus 2: NEGATIVE

## 2020-10-31 NOTE — Anesthesia Preprocedure Evaluation (Addendum)
Anesthesia Evaluation  Patient identified by MRN, date of birth, ID band Patient awake    Reviewed: Allergy & Precautions, NPO status , Patient's Chart, lab work & pertinent test results  History of Anesthesia Complications Negative for: history of anesthetic complications  Airway Mallampati: III  TM Distance: >3 FB Neck ROM: Full    Dental  (+) Dental Advisory Given   Pulmonary neg pulmonary ROS,  10/29/2020 SARS coronavirus NEG   breath sounds clear to auscultation       Cardiovascular hypertension, Pt. on medications (-) angina Rhythm:Regular Rate:Normal     Neuro/Psych Depression negative neurological ROS     GI/Hepatic Neg liver ROS, GERD  Controlled,  Endo/Other  negative endocrine ROSMorbid obesity  Renal/GU stones   BPH    Musculoskeletal  (+) Arthritis ,   Abdominal (+) + obese,   Peds  Hematology negative hematology ROS (+)   Anesthesia Other Findings   Reproductive/Obstetrics                            Anesthesia Physical Anesthesia Plan  ASA: III  Anesthesia Plan: General   Post-op Pain Management:    Induction:   PONV Risk Score and Plan: 2 and Ondansetron and Dexamethasone  Airway Management Planned: LMA  Additional Equipment: None  Intra-op Plan:   Post-operative Plan:   Informed Consent: I have reviewed the patients History and Physical, chart, labs and discussed the procedure including the risks, benefits and alternatives for the proposed anesthesia with the patient or authorized representative who has indicated his/her understanding and acceptance.     Dental advisory given  Plan Discussed with: CRNA and Surgeon  Anesthesia Plan Comments:        Anesthesia Quick Evaluation

## 2020-11-01 ENCOUNTER — Ambulatory Visit (HOSPITAL_BASED_OUTPATIENT_CLINIC_OR_DEPARTMENT_OTHER)
Admission: RE | Admit: 2020-11-01 | Discharge: 2020-11-01 | Disposition: A | Discharge status: Home / Self Care | Payer: BC Managed Care – PPO | Attending: Urology | Admitting: Urology

## 2020-11-01 ENCOUNTER — Other Ambulatory Visit: Payer: Self-pay

## 2020-11-01 ENCOUNTER — Encounter (HOSPITAL_BASED_OUTPATIENT_CLINIC_OR_DEPARTMENT_OTHER)
Admission: RE | Disposition: A | Discharge status: Home / Self Care | Payer: Self-pay | Source: Home / Self Care | Attending: Urology

## 2020-11-01 ENCOUNTER — Ambulatory Visit (HOSPITAL_BASED_OUTPATIENT_CLINIC_OR_DEPARTMENT_OTHER): Payer: BC Managed Care – PPO | Admitting: Anesthesiology

## 2020-11-01 ENCOUNTER — Encounter (HOSPITAL_BASED_OUTPATIENT_CLINIC_OR_DEPARTMENT_OTHER): Payer: Self-pay | Admitting: Urology

## 2020-11-01 DIAGNOSIS — N210 Calculus in bladder: Secondary | ICD-10-CM | POA: Diagnosis not present

## 2020-11-01 DIAGNOSIS — Z808 Family history of malignant neoplasm of other organs or systems: Secondary | ICD-10-CM | POA: Diagnosis not present

## 2020-11-01 DIAGNOSIS — Z96652 Presence of left artificial knee joint: Secondary | ICD-10-CM | POA: Diagnosis not present

## 2020-11-01 DIAGNOSIS — R3912 Poor urinary stream: Secondary | ICD-10-CM | POA: Insufficient documentation

## 2020-11-01 DIAGNOSIS — Z882 Allergy status to sulfonamides status: Secondary | ICD-10-CM | POA: Insufficient documentation

## 2020-11-01 DIAGNOSIS — Z20822 Contact with and (suspected) exposure to covid-19: Secondary | ICD-10-CM | POA: Diagnosis not present

## 2020-11-01 DIAGNOSIS — N401 Enlarged prostate with lower urinary tract symptoms: Secondary | ICD-10-CM | POA: Diagnosis not present

## 2020-11-01 DIAGNOSIS — Z8616 Personal history of COVID-19: Secondary | ICD-10-CM | POA: Diagnosis not present

## 2020-11-01 DIAGNOSIS — Z791 Long term (current) use of non-steroidal anti-inflammatories (NSAID): Secondary | ICD-10-CM | POA: Insufficient documentation

## 2020-11-01 DIAGNOSIS — Z79899 Other long term (current) drug therapy: Secondary | ICD-10-CM | POA: Insufficient documentation

## 2020-11-01 DIAGNOSIS — F32A Depression, unspecified: Secondary | ICD-10-CM | POA: Diagnosis not present

## 2020-11-01 DIAGNOSIS — I10 Essential (primary) hypertension: Secondary | ICD-10-CM | POA: Diagnosis not present

## 2020-11-01 DIAGNOSIS — N400 Enlarged prostate without lower urinary tract symptoms: Secondary | ICD-10-CM | POA: Diagnosis not present

## 2020-11-01 HISTORY — DX: Essential (primary) hypertension: I10

## 2020-11-01 HISTORY — PX: CYSTOSCOPY WITH INSERTION OF UROLIFT: SHX6678

## 2020-11-01 HISTORY — DX: Presence of spectacles and contact lenses: Z97.3

## 2020-11-01 HISTORY — PX: CYSTOSCOPY WITH LITHOLAPAXY: SHX1425

## 2020-11-01 HISTORY — DX: Personal history of urinary calculi: Z87.442

## 2020-11-01 LAB — POCT I-STAT, CHEM 8
BUN: 15 mg/dL (ref 8–23)
Calcium, Ion: 1.26 mmol/L (ref 1.15–1.40)
Chloride: 102 mmol/L (ref 98–111)
Creatinine, Ser: 0.9 mg/dL (ref 0.61–1.24)
Glucose, Bld: 98 mg/dL (ref 70–99)
HCT: 42 % (ref 39.0–52.0)
Hemoglobin: 14.3 g/dL (ref 13.0–17.0)
Potassium: 4 mmol/L (ref 3.5–5.1)
Sodium: 141 mmol/L (ref 135–145)
TCO2: 27 mmol/L (ref 22–32)

## 2020-11-01 SURGERY — CYSTOSCOPY, WITH BLADDER CALCULUS LITHOLAPAXY
Anesthesia: General | Site: Prostate

## 2020-11-01 MED ORDER — MEPERIDINE HCL 25 MG/ML IJ SOLN: 6.2500 mg | INTRAMUSCULAR | Status: DC | PRN

## 2020-11-01 MED ORDER — OXYCODONE HCL 5 MG PO TABS
5.0000 mg | ORAL_TABLET | Freq: Once | ORAL | Status: AC | PRN
Start: 2020-11-01 — End: 2020-11-01
  Administered 2020-11-01: 5 mg via ORAL

## 2020-11-01 MED ORDER — DEXAMETHASONE SODIUM PHOSPHATE 10 MG/ML IJ SOLN
INTRAMUSCULAR | Status: DC | PRN
  Administered 2020-11-01: 10 mg via INTRAVENOUS

## 2020-11-01 MED ORDER — ONDANSETRON HCL 4 MG/2ML IJ SOLN
INTRAMUSCULAR | Status: AC
  Filled 2020-11-01: qty 2

## 2020-11-01 MED ORDER — PROMETHAZINE HCL 25 MG/ML IJ SOLN: 6.2500 mg | INTRAMUSCULAR | Status: DC | PRN

## 2020-11-01 MED ORDER — OXYBUTYNIN CHLORIDE 5 MG PO TABS
ORAL_TABLET | ORAL | Status: AC
  Filled 2020-11-01: qty 1

## 2020-11-01 MED ORDER — PROMETHAZINE HCL 12.5 MG PO TABS: 12.5000 mg | ORAL_TABLET | Freq: Three times a day (TID) | ORAL | 0 refills | Status: DC | PRN

## 2020-11-01 MED ORDER — OXYBUTYNIN CHLORIDE 5 MG PO TABS: 5.0000 mg | ORAL_TABLET | Freq: Three times a day (TID) | ORAL | 0 refills | Status: DC | PRN

## 2020-11-01 MED ORDER — FENTANYL CITRATE (PF) 100 MCG/2ML IJ SOLN
25.0000 ug | INTRAMUSCULAR | Status: DC | PRN
Start: 2020-11-01 — End: 2020-11-01
  Administered 2020-11-01: 25 ug via INTRAVENOUS
  Administered 2020-11-01: 50 ug via INTRAVENOUS
  Administered 2020-11-01: 25 ug via INTRAVENOUS

## 2020-11-01 MED ORDER — CEFAZOLIN SODIUM-DEXTROSE 2-4 GM/100ML-% IV SOLN
INTRAVENOUS | Status: AC
  Filled 2020-11-01: qty 100

## 2020-11-01 MED ORDER — PROPOFOL 500 MG/50ML IV EMUL
INTRAVENOUS | Status: AC
  Filled 2020-11-01: qty 50

## 2020-11-01 MED ORDER — OXYBUTYNIN CHLORIDE 5 MG PO TABS
5.0000 mg | ORAL_TABLET | Freq: Three times a day (TID) | ORAL | Status: DC
  Administered 2020-11-01: 5 mg via ORAL

## 2020-11-01 MED ORDER — OXYCODONE HCL 5 MG/5ML PO SOLN
5.0000 mg | Freq: Once | ORAL | Status: AC | PRN
Start: 2020-11-01 — End: 2020-11-01

## 2020-11-01 MED ORDER — LIDOCAINE 2% (20 MG/ML) 5 ML SYRINGE
INTRAMUSCULAR | Status: DC | PRN
  Administered 2020-11-01: 20 mg via INTRAVENOUS

## 2020-11-01 MED ORDER — SODIUM CHLORIDE 0.9 % IR SOLN
Status: DC | PRN
  Administered 2020-11-01: 3000 mL

## 2020-11-01 MED ORDER — MIDAZOLAM HCL 5 MG/5ML IJ SOLN
INTRAMUSCULAR | Status: DC | PRN
  Administered 2020-11-01: 2 mg via INTRAVENOUS

## 2020-11-01 MED ORDER — STERILE WATER FOR IRRIGATION IR SOLN
Status: DC | PRN
  Administered 2020-11-01 (×2): 3000 mL
  Administered 2020-11-01: 6000 mL

## 2020-11-01 MED ORDER — CEFAZOLIN SODIUM-DEXTROSE 2-4 GM/100ML-% IV SOLN
2.0000 g | Freq: Once | INTRAVENOUS | Status: AC
  Administered 2020-11-01: 2 g via INTRAVENOUS

## 2020-11-01 MED ORDER — LACTATED RINGERS IV SOLN: INTRAVENOUS | Status: DC

## 2020-11-01 MED ORDER — PROPOFOL 10 MG/ML IV BOLUS
INTRAVENOUS | Status: DC | PRN
  Administered 2020-11-01: 200 mg via INTRAVENOUS

## 2020-11-01 MED ORDER — BELLADONNA ALKALOIDS-OPIUM 16.2-60 MG RE SUPP
RECTAL | Status: AC
  Filled 2020-11-01: qty 1

## 2020-11-01 MED ORDER — FENTANYL CITRATE (PF) 100 MCG/2ML IJ SOLN
INTRAMUSCULAR | Status: AC
  Filled 2020-11-01: qty 2

## 2020-11-01 MED ORDER — LIDOCAINE 2% (20 MG/ML) 5 ML SYRINGE
INTRAMUSCULAR | Status: AC
  Filled 2020-11-01: qty 5

## 2020-11-01 MED ORDER — DEXAMETHASONE SODIUM PHOSPHATE 10 MG/ML IJ SOLN
INTRAMUSCULAR | Status: AC
  Filled 2020-11-01: qty 1

## 2020-11-01 MED ORDER — BELLADONNA ALKALOIDS-OPIUM 16.2-60 MG RE SUPP
RECTAL | Status: DC | PRN
  Administered 2020-11-01: 1 via RECTAL

## 2020-11-01 MED ORDER — MIDAZOLAM HCL 2 MG/2ML IJ SOLN: 0.5000 mg | Freq: Once | INTRAMUSCULAR | Status: DC | PRN

## 2020-11-01 MED ORDER — ONDANSETRON HCL 4 MG/2ML IJ SOLN
INTRAMUSCULAR | Status: DC | PRN
  Administered 2020-11-01: 4 mg via INTRAVENOUS

## 2020-11-01 MED ORDER — MIDAZOLAM HCL 2 MG/2ML IJ SOLN
INTRAMUSCULAR | Status: AC
  Filled 2020-11-01: qty 2

## 2020-11-01 MED ORDER — ACETAMINOPHEN 500 MG PO TABS
1000.0000 mg | ORAL_TABLET | Freq: Once | ORAL | Status: AC
  Administered 2020-11-01: 1000 mg via ORAL

## 2020-11-01 MED ORDER — FENTANYL CITRATE (PF) 100 MCG/2ML IJ SOLN
INTRAMUSCULAR | Status: DC | PRN
  Administered 2020-11-01 (×2): 25 ug via INTRAVENOUS
  Administered 2020-11-01 (×3): 50 ug via INTRAVENOUS

## 2020-11-01 MED ORDER — ACETAMINOPHEN 500 MG PO TABS
ORAL_TABLET | ORAL | Status: AC
  Filled 2020-11-01: qty 2

## 2020-11-01 MED ORDER — OXYCODONE HCL 5 MG PO TABS
ORAL_TABLET | ORAL | Status: AC
  Filled 2020-11-01: qty 1

## 2020-11-01 MED ORDER — HYDROCODONE-ACETAMINOPHEN 7.5-325 MG PO TABS: 1.0000 | ORAL_TABLET | Freq: Four times a day (QID) | ORAL | 0 refills | Status: DC | PRN

## 2020-11-01 SURGICAL SUPPLY — 25 items
BAG DRAIN URO-CYSTO SKYTR STRL (DRAIN) ×3 IMPLANT
BAG DRN RND TRDRP ANRFLXCHMBR (UROLOGICAL SUPPLIES) ×2
BAG DRN UROCATH (DRAIN) ×2
BAG URINE DRAIN 2000ML AR STRL (UROLOGICAL SUPPLIES) ×1 IMPLANT
CATH FOLEY 2WAY SLVR  5CC 18FR (CATHETERS) ×3
CATH FOLEY 2WAY SLVR 5CC 18FR (CATHETERS) IMPLANT
CLOTH BEACON ORANGE TIMEOUT ST (SAFETY) ×3 IMPLANT
FIBER LASER FLEXIVA 1000 (UROLOGICAL SUPPLIES) ×3 IMPLANT
GLOVE SURG ENC MOIS LTX SZ7.5 (GLOVE) ×3 IMPLANT
GLOVE SURG ENC MOIS LTX SZ8 (GLOVE) ×1 IMPLANT
GLOVE SURG UNDER POLY LF SZ7.5 (GLOVE) ×2 IMPLANT
GOWN STRL REUS W/TWL LRG LVL3 (GOWN DISPOSABLE) ×4 IMPLANT
HOLDER FOLEY CATH W/STRAP (MISCELLANEOUS) ×1 IMPLANT
IV NS IRRIG 3000ML ARTHROMATIC (IV SOLUTION) ×1 IMPLANT
KIT TURNOVER CYSTO (KITS) ×3 IMPLANT
MANIFOLD NEPTUNE II (INSTRUMENTS) ×3 IMPLANT
NS IRRIG 500ML POUR BTL (IV SOLUTION) ×3 IMPLANT
PACK CYSTO (CUSTOM PROCEDURE TRAY) ×3 IMPLANT
SYR TOOMEY IRRIG 70ML (MISCELLANEOUS) ×3
SYRINGE TOOMEY IRRIG 70ML (MISCELLANEOUS) IMPLANT
SYSTEM UROLIFT (Male Continence) ×6 IMPLANT
TUBE CONNECTING 12X1/4 (SUCTIONS) ×3 IMPLANT
TUBING UROLOGY SET (TUBING) ×1 IMPLANT
Tubing Set-Single Lumen, Inflow with Dua Outflow IMPLANT
WATER STERILE IRR 3000ML UROMA (IV SOLUTION) ×4 IMPLANT

## 2020-11-01 NOTE — Transfer of Care (Signed)
Immediate Anesthesia Transfer of Care Note  Patient: Carlos Ellis  Procedure(s) Performed: CYSTOSCOPY WITH LITHOLAPAXY (N/A Bladder) CYSTOSCOPY WITH INSERTION OF UROLIFT (Prostate)  Patient Location: PACU  Anesthesia Type:General  Level of Consciousness: awake, alert  and oriented  Airway & Oxygen Therapy: Patient Spontanous Breathing and Patient connected to face mask oxygen  Post-op Assessment: Report given to RN and Post -op Vital signs reviewed and stable  Post vital signs: Reviewed and stable  Last Vitals:  Vitals Value Taken Time  BP 141/89 11/01/20 0900  Temp    Pulse 79 11/01/20 0901  Resp 18 11/01/20 0901  SpO2 100 % 11/01/20 0901  Vitals shown include unvalidated device data.  Last Pain:  Vitals:   11/01/20 0615  TempSrc: Oral  PainSc: 0-No pain      Patients Stated Pain Goal: 4 (11/01/20 0615)  Complications: No complications documented.

## 2020-11-01 NOTE — H&P (Signed)
H&P  Chief Complaint: BPH, bladder stones  History of Present Illness: Mr. Carlos Ellis is a 65 year old male with a history of BPH and lower urinary tract symptoms.  He has been on tamsulosin and finasteride.  He had gross hematuria and CT scan 12/21 revealed several 1 cm bladder stones.  Otherwise evaluation benign and cystoscopy was benign.  He had symmetric lateral lobe hypertrophy.  Prostate was about 90 g on CT scan with follow-up prostate ultrasound March 2022 with prostate at 72 g.  He is well today.  Voiding much better on combination medical therapy.  He would like to get off 1 or both of the medicines.  He has had no dysuria or further gross hematuria.  Past Medical History:  Diagnosis Date  . Acne    Dr. Tamala Julian (was on accutane at one point)  . Adult ADHD 11/2017  . Borderline hyperlipidemia   . BPH with obstruction/lower urinary tract symptoms 2015   finasteride trial 09/2015. Tamsulosin added 05/2019 urol->Surgical options discussed at that time.  . Condyloma acuminata 07/2019   responded well to imiquimod  . COVID 08/2019   high fever lethargic weakness x 5 days all symptoms resolved  . Depression    Zoloft 2005  . Diverticulosis 08/2019   Noted on screening colonoscopy  . Elevated PSA 2015   Bx benign 12/2014.  04/2016 prostate MRI negative. PSA stable at 4.29 as of 02/2018.  PSA doubled 08/2018-->13.2 02/2019->rpt bx benign. PSAs followed by [facility].  Marland Kitchen Episcleritis of right eye    w/mild scleritis right eye ([facility] opht 2011); also with hx of ocular varicella at age 55.  Marland Kitchen GERD (gastroesophageal reflux disease)    Tums  . Gross hematuria summer 2021   urol->w/u-> Bladder stones. pt to get cystolithopaxy as of 09/27/20  . History of kidney stones 25 yrs ago  . Hypertension   . Hypogonadism male 2009  . Nephrolithiasis   . Osteoarthritis of left knee    Dr. Mayer Camel  . Skin abnormality 10/28/2020   right thigh ingrown hair on doxycycline healing well per pt  . Wears glasses    Past  Surgical History:  Procedure Laterality Date  . COLONOSCOPY  09/22/2019   X 2, normal (for strong FH of colon cancer.  Last in approx 2010 was normal.  Rpt 08/2019 NO POLYPS->repeat 10 yrs  . FRACTURE SURGERY Left 1974   wrist   . KNEE ARTHROSCOPY     Left: 1990s and early 2000's.  Marland Kitchen LITHOTRIPSY     1990s  . PROSTATE BIOPSY  12/2014; 02/2019   2016 benign.  02/2019 benign.  Marland Kitchen ROTATOR CUFF REPAIR Right 2004   Right  . THROAT SURGERY     Infected branchial cleft cyst 2007  . TOTAL KNEE ARTHROPLASTY Left 03/10/2016   Procedure: TOTAL KNEE ARTHROPLASTY;  Surgeon: Melrose Nakayama, MD;  Location: [facility];  Service: Orthopedics;  Laterality: Left;    Home Medications:  Medications Prior to Admission  Medication Sig Dispense Refill Last Dose  . amLODipine (NORVASC) 5 MG tablet Take 1 tablet (5 mg total) by mouth daily. 30 tablet 0 10/31/2020 at Unknown time  . doxycycline (VIBRAMYCIN) 100 MG capsule Take 100 mg by mouth 2 (two) times daily. For ingrown hair on right thigh healing well   Past Week at Unknown time  . finasteride (PROSCAR) 5 MG tablet Take 5 mg by mouth daily.  11 10/31/2020 at Unknown time  . methylphenidate (RITALIN) 10 MG tablet 1 tab po q Afternoon  as needed 90 tablet 0 10/31/2020 at Unknown time  . methylphenidate 54 MG PO CR tablet Take 1 tablet (54 mg total) by mouth every morning. 90 tablet 0 10/31/2020 at Unknown time  . naproxen (NAPROSYN) 500 MG tablet Take 500 mg by mouth daily.   Past Week at Unknown time  . tamsulosin (FLOMAX) 0.4 MG CAPS capsule Take 0.4 mg by mouth.   10/31/2020 at Unknown time   Allergies:  Allergies  Allergen Reactions  . Sulfa Antibiotics Rash    Family History  Problem Relation Age of Onset  . Cancer Paternal Grandfather        either prostate, colon or something in that area - not definitive dx of cancer   . Multiple myeloma Brother   . Colon cancer Neg Hx   . Colon polyps Neg Hx   . Esophageal cancer Neg Hx   . Rectal cancer Neg Hx   .  Stomach cancer Neg Hx    Social History:  reports that he has never smoked. He has never used smokeless tobacco. He reports current alcohol use of about 3.0 standard drinks of alcohol per week. He reports that he does not use drugs.  ROS: A complete review of systems was performed.  All systems are negative except for pertinent findings as noted. Review of Systems  All other systems reviewed and are negative.    Physical Exam:  Vital signs in last 24 hours: Temp:  [98 F (36.7 C)] 98 F (36.7 C) (03/11 0615) Pulse Rate:  [83] 83 (03/11 0615) Resp:  [18] 18 (03/11 0615) BP: (161)/(93) 161/93 (03/11 0615) SpO2:  [97 %] 97 % (03/11 0615) Weight:  [384 kg] 121 kg (03/11 0615) General:  Alert and oriented, No acute distress HEENT: Normocephalic, atraumatic Cardiovascular: Regular rate and rhythm Lungs: Regular rate and effort Abdomen: Soft, nontender, nondistended, no abdominal masses Back: No CVA tenderness Extremities: No edema Neurologic: Grossly intact  Laboratory Data:  Results for orders placed or performed during the hospital encounter of 11/01/20 (from the past 24 hour(s))  I-STAT, chem 8     Status: None   Collection Time: 11/01/20  6:27 AM  Result Value Ref Range   Sodium 141 135 - 145 mmol/L   Potassium 4.0 3.5 - 5.1 mmol/L   Chloride 102 98 - 111 mmol/L   BUN 15 8 - 23 mg/dL   Creatinine, Ser 0.90 0.61 - 1.24 mg/dL   Glucose, Bld 98 70 - 99 mg/dL   Calcium, Ion 1.26 1.15 - 1.40 mmol/L   TCO2 27 22 - 32 mmol/L   Hemoglobin 14.3 13.0 - 17.0 g/dL   HCT 42.0 39.0 - 52.0 %   Recent Results (from the past 240 hour(s))  SARS CORONAVIRUS 2 (TAT 6-24 HRS) Nasopharyngeal Nasopharyngeal Swab     Status: None   Collection Time: 10/29/20  9:24 AM   Specimen: Nasopharyngeal Swab  Result Value Ref Range Status   SARS Coronavirus 2 NEGATIVE NEGATIVE Final    Comment: (NOTE) SARS-CoV-2 target nucleic acids are NOT DETECTED.  The SARS-CoV-2 RNA is generally detectable in  upper and lower respiratory specimens during the acute phase of infection. Negative results do not preclude SARS-CoV-2 infection, do not rule out co-infections with other pathogens, and should not be used as the sole basis for treatment or other patient management decisions. Negative results must be combined with clinical observations, patient history, and epidemiological information. The expected result is Negative.  Fact Sheet for Patients: SugarRoll.be  Fact  Sheet for Healthcare Providers: https://www.woods-mathews.com/  This test is not yet approved or cleared by the [place] FDA and  has been authorized for detection and/or diagnosis of SARS-CoV-2 by FDA under an Emergency Use Authorization (EUA). This EUA will remain  in effect (meaning this test can be used) for the duration of the COVID-19 declaration under Se ction 564(b)(1) of the Act, 21 U.S.C. section 360bbb-3(b)(1), unless the authorization is terminated or revoked sooner.  Performed at [facility], [place] [address] [place], [place] [address]    Creatinine: Recent Labs    11/01/20 3295  CREATININE 0.90    Impression/Assessment:  BPH, bladder stones-patient on maximal medical therapy.  Plan:  I discussed with the patient the nature, potential benefits, risks and alternatives to cystoscopy with laser cystolitholopaxy, prostatic urethral lift, including side effects of the proposed treatment, the likelihood of the patient achieving the goals of the procedure, and any potential problems that might occur during the procedure or recuperation.  We discussed expectations for the need for continued medical therapy and/or other procedures in the future.  All questions answered.  Patient elects to proceed.  We will plan to keep the catheter in for 3 days.   Festus Aloe 11/01/2020, 7:22 AM

## 2020-11-01 NOTE — Anesthesia Procedure Notes (Signed)
Procedure Name: LMA Insertion Date/Time: 11/01/2020 7:37 AM Performed by: Pearson Grippe, CRNA Pre-anesthesia Checklist: Patient identified, Emergency Drugs available, Suction available and Patient being monitored Patient Re-evaluated:Patient Re-evaluated prior to induction Oxygen Delivery Method: Circle system utilized Preoxygenation: Pre-oxygenation with 100% oxygen Induction Type: IV induction Ventilation: Mask ventilation without difficulty LMA: LMA inserted LMA Size: 5.0 Number of attempts: 1 Airway Equipment and Method: Bite block Placement Confirmation: positive ETCO2 Tube secured with: Tape Dental Injury: Teeth and Oropharynx as per pre-operative assessment

## 2020-11-01 NOTE — Progress Notes (Signed)
Dr [name] at bedside to evaluate patient.

## 2020-11-01 NOTE — Progress Notes (Signed)
Dr [name] checking on patient.

## 2020-11-01 NOTE — Anesthesia Postprocedure Evaluation (Signed)
Anesthesia Post Note  Patient: Carlos Ellis  Procedure(s) Performed: CYSTOSCOPY WITH LITHOLAPAXY (N/A Bladder) CYSTOSCOPY WITH INSERTION OF UROLIFT (Prostate)     Patient location during evaluation: Phase II Anesthesia Type: General Level of consciousness: awake and alert, patient cooperative and oriented Pain management: pain level controlled Vital Signs Assessment: post-procedure vital signs reviewed and stable Respiratory status: nonlabored ventilation, spontaneous breathing and respiratory function stable Cardiovascular status: blood pressure returned to baseline and stable Postop Assessment: able to ambulate, adequate PO intake and no apparent nausea or vomiting Anesthetic complications: no   No complications documented.  Last Vitals:  Vitals:   11/01/20 0945 11/01/20 1000  BP: (!) 147/83 140/86  Pulse: 69 66  Resp: 14 11  Temp:    SpO2: 98% 97%    Last Pain:  Vitals:   11/01/20 1000  TempSrc:   PainSc: 6                  Jayleene Glaeser,E. Makenlee Mckeag

## 2020-11-01 NOTE — Op Note (Signed)
Preoperative diagnosis: BPH with weak stream, bladder stones Postoperative diagnosis: Same  Procedure: Cystoscopy with laser cystolitholopaxy, UroLift prostatic urethral lift x6  Surgeon: [name]  Anesthesia: General  Indication for procedure: Carlos Ellis is a 65 year old male with symptomatic BPH on maximal medical therapy with tamsulosin and finasteride.  He also had gross hematuria and evaluation revealed bladder stones.  Findings: On exam under anesthesia the penis was circumcised without mass or lesion.  Scrotum normal.  Testicles descended bilaterally and palpably normal.  On DRE the prostate was about 50 g and smooth without hard area or nodule.  A BNO suppository was placed.  On cystoscopy there was lateral lobe hypertrophy.  There was a small median lobe with more of a cleft on the patient's right than left.  I was able to pin the lobe over with the UroLift implant.  The bladder was mildly trabeculated.  There were 4 to 5 stones in the bladder largest about 15 mm.  Good anterior channel was noted after 6 UroLift implants.  Description of procedure: After consent was obtained patient brought to the operating room.  After adequate anesthesia he was placed lithotomy position and prepped and draped in the usual sterile fashion.  Timeout was performed from the patient and procedure.  Exam under anesthesia was performed.  The cystoscope was passed per urethra and the bladder carefully inspected.  The then exchanged for the continuous flow laser scope and passed 1000 m laser fiber.  At a setting of 0.5 and 50 and 1 and 20 all of the stones were fragmented and evacuated.  These continuous flow scope was removed and the 17 Fr UroLift scope then advanced.  Prostatic urethra was again carefully inspected to plan implants.   The cystoscopy bridge was replaced with a UroLift delivery device.The first treatment site was the patient's left side approximately 1.5cm distal to the bladder neck (#1). The  distal tip of the delivery device was then angled laterally approximately 20 degrees at this position to compress the lateral lobe. The trigger was pulled, thereby deploying a needle containing the implant through the prostate. The needle was then retracted, allowing one end of the implant to be delivered to the capsular surface of the prostate. The implant was then tensioned to assure capsular seating and removal of slack monofilament. The device was then angled back toward midline and slowly advanced proximally until cystoscopic verification of the monofilament being centered in the delivery bay. The urethral end piece was then affixed to the monofilament thereby tailoring the size of the implant. Excess filament was then severed. The delivery device was then re-advanced into the bladder. The delivery device was then replaced with cystoscope and bridge and the implant location and opening effect was confirmed cystoscopically. The same procedure was then repeated on the left side just proximal to the verumontanum (#2), one at the right side ~1.5 cm distal to the bladder neck (#3), 1 on the right side just proximal to the verumontanum (#4), one on the median lobe going from the sulcus at 7:00 placing it to the patient's left side (#5 going from right to left).  The 2 left lateral implants were somewhat overlapping so put another one on the left side just proximal to the veru (#6), following the same technique. A final cystoscopy was conducted first to inspect the location and state of each implant and second, to confirm the presence of a continuous anterior channel was present through the prostatic urethra with irrigation flow turned off. Bladder neck  inspected and noted to be patent and bladder inspected and no stone fragments or implants in the bladder. 6 Implants were delivered in total.    Following this, the scope was removed and an 57 French Foley catheter was placed and hooked to dependent drainage.  Urine  was light pink.  Catheter irrigated noted to be clear.  I placed a BNO suppository. He was then awakened and taken recovery room in stable condition.    Complications: None  Blood loss: Minimal  Specimens: Stone fragments to patient  Drains: 18 French Foley catheter  Disposition: Patient stable to PACU

## 2020-11-01 NOTE — Discharge Instructions (Signed)
Indwelling Urinary Catheter Care, Adult An indwelling urinary catheter is a thin tube that is put into your bladder. The tube helps to drain pee (urine) out of your body. The tube goes in through your urethra. Your urethra is where pee comes out of your body. Your pee will come out through the catheter, then it will go into a bag (drainage bag). Take good care of your catheter so it will work well. How to wear your catheter and bag Supplies needed  Sticky tape (adhesive tape) or a leg strap.  Alcohol wipe or soap and water (if you use tape).  A clean towel (if you use tape).  Large overnight bag.  Smaller bag (leg bag). Wearing your catheter Attach your catheter to your leg with tape or a leg strap.  Make sure the catheter is not pulled tight.  If a leg strap gets wet, take it off and put on a dry strap.  If you use tape to hold the bag on your leg: 1. Use an alcohol wipe or soap and water to wash your skin where the tape made it sticky before. 2. Use a clean towel to pat-dry that skin. 3. Use new tape to make the bag stay on your leg. Wearing your bags You should have been given a large overnight bag.  You may wear the overnight bag in the day or night.  Always have the overnight bag lower than your bladder.  Do not let the bag touch the floor.  Before you go to sleep, put a clean plastic bag in a wastebasket. Then hang the overnight bag inside the wastebasket. You should also have a smaller leg bag that fits under your clothes.  Always wear the leg bag below your knee.  Do not wear your leg bag at night. How to care for your skin and catheter Supplies needed  A clean washcloth.  Water and mild soap.  A clean towel. Caring for your skin and catheter  Clean the skin around your catheter every day: 1. Wash your hands with soap and water. 2. Wet a clean washcloth in warm water and mild soap. 3. Clean the skin around your urethra.  If you are male:  Gently  spread the folds of skin around your vagina (labia).  With the washcloth in your other hand, wipe the inner side of your labia on each side. Wipe from front to back.  If you are male:  Pull back any skin that covers the end of your penis (foreskin).  With the washcloth in your other hand, wipe your penis in small circles. Start wiping at the tip of your penis, then move away from the catheter.  Move the foreskin back in place, if needed. 4. With your free hand, hold the catheter close to where it goes into your body.  Keep holding the catheter during cleaning so it does not get pulled out. 5. With the washcloth in your other hand, clean the catheter.  Only wipe downward on the catheter.  Do not wipe upward toward your body. Doing this may push germs into your urethra and cause infection. 6. Use a clean towel to pat-dry the catheter and the skin around it. Make sure to wipe off all soap. 7. Wash your hands with soap and water.  Shower every day. Do not take baths.  Do not use cream, ointment, or lotion on the area where the catheter goes into your body, unless your doctor tells you to.  Do not   use powders, sprays, or lotions on your genital area.  Check your skin around the catheter every day for signs of infection. Check for: ? Redness, swelling, or pain. ? Fluid or blood. ? Warmth. ? Pus or a bad smell.      How to empty the bag Supplies needed  Rubbing alcohol.  Gauze pad or cotton ball.  Tape or a leg strap. Emptying the bag Pour the pee out of your bag when it is ?- full, or at least 2-3 times a day. Do this for your overnight bag and your leg bag. 1. Wash your hands with soap and water. 2. Separate (detach) the bag from your leg. 3. Hold the bag over the toilet or a clean pail. Keep the bag lower than your hips and bladder. This is so the pee (urine) does not go back into the tube. 4. Open the pour spout. It is at the bottom of the bag. 5. Empty the pee into the  toilet or pail. Do not let the pour spout touch any surface. 6. Put rubbing alcohol on a gauze pad or cotton ball. 7. Use the gauze pad or cotton ball to clean the pour spout. 8. Close the pour spout. 9. Attach the bag to your leg with tape or a leg strap. 10. Wash your hands with soap and water. Follow instructions for cleaning the drainage bag:  From the product maker.  As told by your doctor. How to change the bag Supplies needed  Alcohol wipes.  A clean bag.  Tape or a leg strap. Changing the bag Replace your bag when it starts to leak, smell bad, or look dirty. 1. Wash your hands with soap and water. 2. Separate the dirty bag from your leg. 3. Pinch the catheter with your fingers so that pee does not spill out. 4. Separate the catheter tube from the bag tube where these tubes connect (at the connection valve). Do not let the tubes touch any surface. 5. Clean the end of the catheter tube with an alcohol wipe. Use a different alcohol wipe to clean the end of the bag tube. 6. Connect the catheter tube to the tube of the clean bag. 7. Attach the clean bag to your leg with tape or a leg strap. Do not make the bag tight on your leg. 8. Wash your hands with soap and water. General rules  Never pull on your catheter. Never try to take it out. Doing that can hurt you.  Always wash your hands before and after you touch your catheter or bag. Use a mild, fragrance-free soap. If you do not have soap and water, use hand sanitizer.  Always make sure there are no twists or bends (kinks) in the catheter tube.  Always make sure there are no leaks in the catheter or bag.  Drink enough fluid to keep your pee pale yellow.  Do not take baths, swim, or use a hot tub.  If you are male, wipe from front to back after you poop (have a bowel movement).   Contact a doctor if:  Your pee is cloudy.  Your pee smells worse than usual.  Your catheter gets clogged.  Your catheter  leaks.  Your bladder feels full. Get help right away if:  You have redness, swelling, or pain where the catheter goes into your body.  You have fluid, blood, pus, or a bad smell coming from the area where the catheter goes into your body.  Your skin feels   warm where the catheter goes into your body.  You have a fever.  You have pain in your: ? Belly (abdomen). ? Legs. ? Lower back. ? Bladder.  You see blood in the catheter.  Your pee is pink or red.  You feel sick to your stomach (nauseous).  You throw up (vomit).  You have chills.  Your pee is not draining into the bag.  Your catheter gets pulled out. Summary  An indwelling urinary catheter is a thin tube that is placed into the bladder to help drain pee (urine) out of the body.  The catheter is placed into the part of the body that drains pee from the bladder (urethra).  Taking good care of your catheter will keep it working properly and help prevent problems.  Always wash your hands before and after touching your catheter or bag.  Never pull on your catheter or try to take it out. This information is not intended to replace advice given to you by your health care provider. Make sure you discuss any questions you have with your health care provider. Document Revised: 12/02/2018 Document Reviewed: 03/26/2017 Elsevier Patient Education  2021 Elsevier Inc. Prostatic Urethral Lift, Care After This sheet gives you information about how to care for yourself after your procedure. Your health care provider may also give you more specific instructions. If you have problems or questions, contact your health care provider. What can I expect after the procedure? After the procedure, it is common to have:  Discomfort or burning when urinating.  An increased urge to urinate.  More frequent urination.  Urine that is slightly blood-tinged. These symptoms should go away after a few days. Follow these instructions at  home:  Take over-the-counter and prescription medicines only as told by your health care provider.  Do not drive for 24 hours if you were given a medicine to help you relax (sedative).  Do not drive or use heavy machinery while taking prescription pain medicine.  Do not lift anything that is heavier than 10 lb (4.5 kg) until your health care provider says that this is safe.  Return to your normal activities as told by your health care provider. Ask your health care provider what activities are safe for you. Ask when you can return to sexual activity.  Drink enough fluid to keep your urine clear or pale yellow.  Keep all follow-up visits as told by your health care provider. This is important.   Contact a health care provider if:  You have chills or a fever.  You have pain when passing urine.  You have bright red blood or blood clots in your urine.  You have difficulty passing urine.  You have leaking of urine (incontinence). Get help right away if:  You have chest pain or shortness of breath.  You have leg pain or swelling.  You cannot pass urine. Summary  After the procedure, it is common to have discomfort or burning when urinating, an increased urge to urinate, more frequent urination, and urine that is slightly blood-tinged.  Do not drive for 24 hours if you were given a medicine to help you relax (sedative). Do not drive or use heavy machinery while taking prescription pain medicine.  Do not lift anything that is heavier than 10 lb (4.5 kg) until your health care provider says that this is safe.  Return to your normal activities as told by your health care provider. This information is not intended to replace advice given to you  by your health care provider. Make sure you discuss any questions you have with your health care provider. Document Revised: 04/18/2020 Document Reviewed: 04/18/2020 Elsevier Patient Education  2021 Elsevier Inc.      Post Anesthesia  Home Care Instructions  Activity: Get plenty of rest for the remainder of the day. A responsible individual must stay with you for 24 hours following the procedure.  For the next 24 hours, DO NOT: -Drive a car -Advertising copywriter -Drink alcoholic beverages -Take any medication unless instructed by your physician -Make any legal decisions or sign important papers.  Meals: Start with liquid foods such as gelatin or soup. Progress to regular foods as tolerated. Avoid greasy, spicy, heavy foods. If nausea and/or vomiting occur, drink only clear liquids until the nausea and/or vomiting subsides. Call your physician if vomiting continues.  Special Instructions/Symptoms: Your throat may feel dry or sore from the anesthesia or the breathing tube placed in your throat during surgery. If this causes discomfort, gargle with warm salt water. The discomfort should disappear within 24 hours.  If you had a scopolamine patch placed behind your ear for the management of post- operative nausea and/or vomiting:  1. The medication in the patch is effective for 72 hours, after which it should be removed.  Wrap patch in a tissue and discard in the trash. Wash hands thoroughly with soap and water. 2. You may remove the patch earlier than 72 hours if you experience unpleasant side effects which may include dry mouth, dizziness or visual disturbances. 3. Avoid touching the patch. Wash your hands with soap and water after contact with the patch.

## 2020-11-01 NOTE — Progress Notes (Signed)
Dr [name] by bedside.  Catheter flushed with 500 ml saline by MD.

## 2020-11-03 ENCOUNTER — Emergency Department (HOSPITAL_COMMUNITY)
Admission: EM | Admit: 2020-11-03 | Discharge: 2020-11-03 | Disposition: A | Discharge status: Home / Self Care | Payer: BC Managed Care – PPO | Attending: Emergency Medicine | Admitting: Emergency Medicine

## 2020-11-03 ENCOUNTER — Emergency Department (HOSPITAL_COMMUNITY): Payer: BC Managed Care – PPO

## 2020-11-03 ENCOUNTER — Encounter (HOSPITAL_COMMUNITY): Payer: Self-pay | Admitting: Emergency Medicine

## 2020-11-03 DIAGNOSIS — N400 Enlarged prostate without lower urinary tract symptoms: Secondary | ICD-10-CM | POA: Diagnosis not present

## 2020-11-03 DIAGNOSIS — Z8616 Personal history of COVID-19: Secondary | ICD-10-CM | POA: Diagnosis not present

## 2020-11-03 DIAGNOSIS — R310 Gross hematuria: Secondary | ICD-10-CM

## 2020-11-03 DIAGNOSIS — R109 Unspecified abdominal pain: Secondary | ICD-10-CM | POA: Insufficient documentation

## 2020-11-03 DIAGNOSIS — Z96652 Presence of left artificial knee joint: Secondary | ICD-10-CM | POA: Insufficient documentation

## 2020-11-03 DIAGNOSIS — Y846 Urinary catheterization as the cause of abnormal reaction of the patient, or of later complication, without mention of misadventure at the time of the procedure: Secondary | ICD-10-CM | POA: Insufficient documentation

## 2020-11-03 DIAGNOSIS — I10 Essential (primary) hypertension: Secondary | ICD-10-CM | POA: Insufficient documentation

## 2020-11-03 DIAGNOSIS — T83098A Other mechanical complication of other indwelling urethral catheter, initial encounter: Secondary | ICD-10-CM | POA: Diagnosis not present

## 2020-11-03 DIAGNOSIS — R39198 Other difficulties with micturition: Secondary | ICD-10-CM | POA: Diagnosis not present

## 2020-11-03 DIAGNOSIS — Z79899 Other long term (current) drug therapy: Secondary | ICD-10-CM | POA: Diagnosis not present

## 2020-11-03 DIAGNOSIS — T83091A Other mechanical complication of indwelling urethral catheter, initial encounter: Secondary | ICD-10-CM | POA: Diagnosis not present

## 2020-11-03 NOTE — ED Notes (Signed)
Bladder scan pre-void showed 33mL, put in 70mL of fluid, removed foley catheter and pt ambulated to RR, he voided 100 mL and a large clot and post-void bladder scan showed 52mL

## 2020-11-03 NOTE — ED Triage Notes (Signed)
Patient reports cystoscopy and urolift on Friday. Today c/o leaking around foley catheter with hematuria and clots.

## 2020-11-03 NOTE — ED Notes (Signed)
Irrigated patient's urinary catheter with minor success. First syringe returned small clots and light pink urine. Patient would have bladder pain and clench down and urine flow would stop with irrigation, patient endorsed immense pain.

## 2020-11-03 NOTE — Discharge Instructions (Signed)
Follow with your urologist tomorrow.  Return for any recurrent urinary retention.

## 2020-11-03 NOTE — ED Provider Notes (Signed)
[facility] DEPT Provider Note   CSN: [id number] Arrival date & time: 11/03/20  1633     History Chief Complaint  Patient presents with  . Post-op Problem    Carlos Ellis is a 65 y.o. male presenting to the emergency department with Foley problem.  He had procedure done on 11/01/2020 by Dr. Junious Silk.  He had cystoscopy with bladder stone removal, UroLift placed.  He is sent home with Foley catheter in place.  He states since then he has been continue to have bloody urine and has been passing clots intermittently.  Today however his Foley stopped draining and he began leaking urine around the Foley tube.  He has had a couple of episodes throughout the day of large amount of urine leaking and then his abdominal pressure and discomfort improves.  Not on anticoagulation.  No fevers or vomiting.  The history is provided by the patient and medical records.       Past Medical History:  Diagnosis Date  . Acne    Dr. Tamala Julian (was on accutane at one point)  . Adult ADHD 11/2017  . Borderline hyperlipidemia   . BPH with obstruction/lower urinary tract symptoms 2015   finasteride trial 09/2015. Tamsulosin added 05/2019 urol->Surgical options discussed at that time.  . Condyloma acuminata 07/2019   responded well to imiquimod  . COVID 08/2019   high fever lethargic weakness x 5 days all symptoms resolved  . Depression    Zoloft 2005  . Diverticulosis 08/2019   Noted on screening colonoscopy  . Elevated PSA 2015   Bx benign 12/2014.  04/2016 prostate MRI negative. PSA stable at 4.29 as of 02/2018.  PSA doubled 08/2018-->13.2 02/2019->rpt bx benign. PSAs followed by [facility].  Marland Kitchen Episcleritis of right eye    w/mild scleritis right eye ([facility] opht 2011); also with hx of ocular varicella at age 53.  Marland Kitchen GERD (gastroesophageal reflux disease)    Tums  . Gross hematuria summer 2021   urol->w/u-> Bladder stones. pt to get cystolithopaxy as of 09/27/20  . History of kidney  stones 25 yrs ago  . Hypertension   . Hypogonadism male 2009  . Nephrolithiasis   . Osteoarthritis of left knee    Dr. Mayer Camel  . Skin abnormality 10/28/2020   right thigh ingrown hair on doxycycline healing well per pt  . Wears glasses     Patient Active Problem List   Diagnosis Date Noted  . Hamstring strain, right, initial encounter 08/19/2020  . Primary osteoarthritis of left knee 03/10/2016  . Snoring 09/27/2015  . Hypersomnia 09/27/2015  . Obesity 09/27/2015  . Health maintenance examination 02/14/2012  . Osteoarthritis   . Depression   . GERD (gastroesophageal reflux disease)   . Nephrolithiasis   . Episcleritis of right eye   . Gout   . Acne   . Bronchitis 10/06/2011  . Hypogonadism, male 05/18/2011  . Borderline hyperlipidemia     Past Surgical History:  Procedure Laterality Date  . COLONOSCOPY  09/22/2019   X 2, normal (for strong FH of colon cancer.  Last in approx 2010 was normal.  Rpt 08/2019 NO POLYPS->repeat 10 yrs  . FRACTURE SURGERY Left 1974   wrist   . KNEE ARTHROSCOPY     Left: 1990s and early 2000's.  Marland Kitchen LITHOTRIPSY     1990s  . PROSTATE BIOPSY  12/2014; 02/2019   2016 benign.  02/2019 benign.  Marland Kitchen ROTATOR CUFF REPAIR Right 2004   Right  . THROAT  SURGERY     Infected branchial cleft cyst 2007  . TOTAL KNEE ARTHROPLASTY Left 03/10/2016   Procedure: TOTAL KNEE ARTHROPLASTY;  Surgeon: Melrose Nakayama, MD;  Location: [facility];  Service: Orthopedics;  Laterality: Left;       Family History  Problem Relation Age of Onset  . Cancer Paternal Grandfather        either prostate, colon or something in that area - not definitive dx of cancer   . Multiple myeloma Brother   . Colon cancer Neg Hx   . Colon polyps Neg Hx   . Esophageal cancer Neg Hx   . Rectal cancer Neg Hx   . Stomach cancer Neg Hx     Social History   Tobacco Use  . Smoking status: Never Smoker  . Smokeless tobacco: Never Used  Vaping Use  . Vaping Use: Never used  Substance Use  Topics  . Alcohol use: Yes    Alcohol/week: 3.0 standard drinks    Types: 3 Glasses of wine per week    Comment: few week  . Drug use: No    Home Medications Prior to Admission medications   Medication Sig Start Date End Date Taking? Authorizing Provider  amLODipine (NORVASC) 5 MG tablet Take 1 tablet (5 mg total) by mouth daily. 10/14/20   McGowen, Adrian Blackwater, MD  doxycycline (VIBRAMYCIN) 100 MG capsule Take 100 mg by mouth 2 (two) times daily. For ingrown hair on right thigh healing well    [provider]  finasteride (PROSCAR) 5 MG tablet Take 5 mg by mouth daily. 11/03/15   [provider]  HYDROcodone-acetaminophen (NORCO) 7.5-325 MG tablet Take 1 tablet by mouth every 6 (six) hours as needed for moderate pain. 11/01/20   Festus Aloe, MD  methylphenidate (RITALIN) 10 MG tablet 1 tab po q Afternoon as needed 07/28/20   McGowen, Adrian Blackwater, MD  methylphenidate 54 MG PO CR tablet Take 1 tablet (54 mg total) by mouth every morning. 07/28/20   McGowen, Adrian Blackwater, MD  naproxen (NAPROSYN) 500 MG tablet Take 500 mg by mouth daily. 03/01/20   [provider]  oxybutynin (DITROPAN) 5 MG tablet Take 1 tablet (5 mg total) by mouth 3 (three) times daily as needed for bladder spasms. Do not take Sunday night or Monday prior to foley removal 11/01/20   Festus Aloe, MD  promethazine (PHENERGAN) 12.5 MG tablet Take 1 tablet (12.5 mg total) by mouth every 8 (eight) hours as needed for nausea or vomiting. 11/01/20   Festus Aloe, MD  tamsulosin (FLOMAX) 0.4 MG CAPS capsule Take 0.4 mg by mouth.    [provider]    Allergies    Sulfa antibiotics  Review of Systems   Review of Systems  Genitourinary: Positive for difficulty urinating and hematuria.  All other systems reviewed and are negative.   Physical Exam Updated Vital Signs BP 137/81   Pulse 64   Temp 97.9 F (36.6 C) (Oral)   Resp 18   Ht 6' (1.829 m)   Wt 117.9 kg   SpO2 97%   BMI 35.26 kg/m    Physical Exam Vitals and nursing note reviewed.  Constitutional:      General: He is not in acute distress.    Appearance: He is well-developed. He is not ill-appearing.  HENT:     Head: Normocephalic and atraumatic.  Eyes:     Conjunctiva/sclera: Conjunctivae normal.  Cardiovascular:     Rate and Rhythm: Normal rate and regular rhythm.  Pulmonary:     Effort: Pulmonary effort is normal.     Breath sounds: Normal breath sounds.  Abdominal:     General: Bowel sounds are normal.     Palpations: Abdomen is soft.     Tenderness: There is no abdominal tenderness.  Genitourinary:    Comments: Foley catheter in place, dark red urine present in Foley bag.  There is urine leaking around the Foley tubing at the urethral meatus.  No gross blood is present at the meatus. Skin:    General: Skin is warm.  Neurological:     Mental Status: He is alert.  Psychiatric:        Behavior: Behavior normal.     ED Results / Procedures / Treatments   Labs (all labs ordered are listed, but only abnormal results are displayed) Labs Reviewed - No data to display  EKG None  Radiology US Pelvis Limited  Result Date: 11/03/2020 CLINICAL DATA:  Evaluate for clots in the bladder. EXAM: LIMITED ULTRASOUND OF PELVIS TECHNIQUE: Limited transabdominal ultrasound examination of the pelvis was performed. COMPARISON:  None. FINDINGS: The urinary bladder measures 7.0 cm x 5.8 cm x 6.8 cm and has a normal appearance for the degree of distention. Pre-void volume: 71.5 mL Post-void volume: N/A mL Other findings: The prostate gland is enlarged and measures 6.0 cm x 4.6 cm x 4.7 cm. IMPRESSION: 1. Normal ultrasonographic appearance of the urinary bladder. 2. Enlarged prostate gland. Electronically Signed   By: Virgina Norfolk M.D.   On: 11/03/2020 19:38    Procedures Procedures   Medications Ordered in ED Medications - No data to display  ED Course  I have reviewed the triage vital signs and the nursing  notes.  Pertinent labs & imaging results that were available during my care of the patient were reviewed by me and considered in my medical decision making (see chart for details).    MDM Rules/Calculators/A&P                          Patient presenting for obstructed Foley catheter after procedure 2 days ago.  He has had hematuria which seems to be suspected due to the bladder stones which were removed during the procedure on the 11th.  He also has UroLift placed.  He began leaking urine around his Foley tubing today, and the Foley stopped draining into the bag.  On examination, he states he leaked a large amount of urine just prior to evaluation and his abdomen feels much better.  Foley was flushed, however would not continue to drain.  Consulted with urology, Dr. Sheppard Coil, who agrees with removal of Foley and trial for urination.  He was scheduled to have Foley removed tomorrow in clinic.  Also recommends pelvic ultrasound to evaluate for large blood clot in the bladder.   Patient is not exhibiting any signs or symptoms of acute blood loss anemia.  Based on patient's description, he has not been passing large amounts of blood, nor is he on anticoagulation.   Bladder scan shows minimal fluid present prior to Foley removal, therefore about 50 cc of fluid was introduced into the bladder and Foley was removed.  Patient is noted to have successfully voided about 100 cc of urine and passed a large clot.  Post void was 0.  Patient states he had no difficulty urinating.  He has urinated once more since that.  Pelvic ultrasound shows no evidence of large clot.  Patient feels  much better and is ready for discharge to home.  Encouraged he keep his appointment tomorrow and return for any recurrent urinary retention.  Patient work-up and care plan discussed with attending physician Dr. Francia Greaves, who is in agreement.  Discussed results, findings, treatment and follow up. Patient advised of return precautions.  Patient verbalized understanding and agreed with plan.  Final Clinical Impression(s) / ED Diagnoses Final diagnoses:  Obstruction of Foley catheter, initial encounter [facility])  Gross hematuria    Rx / DC Orders ED Discharge Orders    None       Banita Lehn, [place] N, PA-C 11/03/20 1959    Valarie Merino, MD 11/09/20 2200

## 2020-11-04 ENCOUNTER — Ambulatory Visit: Payer: BC Managed Care – PPO | Admitting: Family Medicine

## 2020-11-04 ENCOUNTER — Encounter (HOSPITAL_BASED_OUTPATIENT_CLINIC_OR_DEPARTMENT_OTHER): Payer: Self-pay | Admitting: Urology

## 2020-11-04 ENCOUNTER — Telehealth: Payer: Self-pay

## 2020-11-04 DIAGNOSIS — N210 Calculus in bladder: Secondary | ICD-10-CM | POA: Diagnosis not present

## 2020-11-04 DIAGNOSIS — N401 Enlarged prostate with lower urinary tract symptoms: Secondary | ICD-10-CM | POA: Diagnosis not present

## 2020-11-04 DIAGNOSIS — R8271 Bacteriuria: Secondary | ICD-10-CM | POA: Diagnosis not present

## 2020-11-04 DIAGNOSIS — R310 Gross hematuria: Secondary | ICD-10-CM | POA: Diagnosis not present

## 2020-11-04 DIAGNOSIS — R3912 Poor urinary stream: Secondary | ICD-10-CM | POA: Diagnosis not present

## 2020-11-04 NOTE — Telephone Encounter (Signed)
Patient was seen in ED last night for catheter blockage.  Patient is in a lot of pain.  He is scheduled to see Dr. Milinda Cave today at 11AM and does not think it is a good idea that he come since it is regarding his blood pressure.  And because he is in so much pain, his blood pressure readings will reflect a higher reading.  Please verify with Dr. Milinda Cave if patient should keep his appt today. Patient can be reached at [phone]  Thank you

## 2020-11-04 NOTE — Progress Notes (Deleted)
OFFICE VISIT  11/04/2020  CC: No chief complaint on file.   HPI:    Patient is a 65 y.o. Caucasian male who presents for 3 wk f/u HTN. A/P as of last visit: "1) HTN, time to start med. Start amlodipine 5mg , monitor bp/hr at home and return to go over these with me in 2-3 wks. Therapeutic expectations and side effect profile of medication discussed today.  Patient's questions answered.  2) Adult ADD: doing well on methylphenidate ER 54mg  qd and most days takes a 10mg  IR methylphenidate around 3 pm. Will get CSC at next f/u in 2-3 wks. No new rx's needed today."  INTERIM HX: He got cystolithopaxy, complicated by foley obst/clotting->to ED yesterday and got foley removed and voided w/out prob->plan to f/u with urol today for recheck.  BP: ***    Past Medical History:  Diagnosis Date  . Acne    Dr. (was on accutane at one point)  . Adult ADHD 11/2017  . Borderline hyperlipidemia   . BPH with obstruction/lower urinary tract symptoms 2015   finasteride trial 09/2015. Tamsulosin added 05/2019 urol->Surgical options discussed at that time.  . Condyloma acuminata 07/2019   responded well to imiquimod  . COVID 08/2019   high fever lethargic weakness x 5 days all symptoms resolved  . Depression    Zoloft 2005  . Diverticulosis 08/2019   Noted on screening colonoscopy  . Elevated PSA 2015   Bx benign 12/2014.  04/2016 prostate MRI negative. PSA stable at 4.29 as of 02/2018.  PSA doubled 08/2018-->13.2 02/2019->rpt bx benign. PSAs followed by 05/2016.  03/2018 Episcleritis of right eye    w/mild scleritis right eye ([facility] opht 2011); also with hx of ocular varicella at age 80.  Marland Kitchen GERD (gastroesophageal reflux disease)    Tums  . Gross hematuria summer 2021   urol->w/u-> Bladder stones. pt to get cystolithopaxy as of 09/27/20  . History of kidney stones 25 yrs ago  . Hypertension   . Hypogonadism male 2009  . Nephrolithiasis   . Osteoarthritis of left knee    Dr. 02-21-1970  . Skin  abnormality 10/28/2020   right thigh ingrown hair on doxycycline healing well per pt  . Wears glasses     Past Surgical History:  Procedure Laterality Date  . COLONOSCOPY  09/22/2019   X 2, normal (for strong FH of colon cancer.  Last in approx 2010 was normal.  Rpt 08/2019 NO POLYPS->repeat 10 yrs  . FRACTURE SURGERY Left 1974   wrist   . KNEE ARTHROSCOPY     Left: 1990s and early 2000's.  2011 LITHOTRIPSY     1990s  . PROSTATE BIOPSY  12/2014; 02/2019   2016 benign.  02/2019 benign.  03/2019 ROTATOR CUFF REPAIR Right 2004   Right  . THROAT SURGERY     Infected branchial cleft cyst 2007  . TOTAL KNEE ARTHROPLASTY Left 03/10/2016   Procedure: TOTAL KNEE ARTHROPLASTY;  Surgeon: 2005, MD;  Location: [facility] OR;  Service: Orthopedics;  Laterality: Left;    Outpatient Medications Prior to Visit  Medication Sig Dispense Refill  . amLODipine (NORVASC) 5 MG tablet Take 1 tablet (5 mg total) by mouth daily. 30 tablet 0  . doxycycline (VIBRAMYCIN) 100 MG capsule Take 100 mg by mouth 2 (two) times daily. For ingrown hair on right thigh healing well    . finasteride (PROSCAR) 5 MG tablet Take 5 mg by mouth daily.  11  . HYDROcodone-acetaminophen (NORCO) 7.5-325 MG tablet Take  1 tablet by mouth every 6 (six) hours as needed for moderate pain. 10 tablet 0  . methylphenidate (RITALIN) 10 MG tablet 1 tab po q Afternoon as needed 90 tablet 0  . methylphenidate 54 MG PO CR tablet Take 1 tablet (54 mg total) by mouth every morning. 90 tablet 0  . naproxen (NAPROSYN) 500 MG tablet Take 500 mg by mouth daily.    Marland Kitchen oxybutynin (DITROPAN) 5 MG tablet Take 1 tablet (5 mg total) by mouth 3 (three) times daily as needed for bladder spasms. Do not take Sunday night or Monday prior to foley removal 10 tablet 0  . promethazine (PHENERGAN) 12.5 MG tablet Take 1 tablet (12.5 mg total) by mouth every 8 (eight) hours as needed for nausea or vomiting. 3 tablet 0  . tamsulosin (FLOMAX) 0.4 MG CAPS capsule Take 0.4 mg by  mouth.     No facility-administered medications prior to visit.    Allergies  Allergen Reactions  . Sulfa Antibiotics Rash    ROS As per HPI  PE: Vitals with BMI 11/03/2020 11/03/2020 11/01/2020  Height - 6\' 0"  -  Weight - 260 lbs -  BMI - 35.25 -  Systolic 137 178  Diastolic 81 88 68  Pulse 64 80 66     ***  LABS:    Chemistry      Component Value Date/Time   NA 141 11/01/2020 0627   K 4.0 11/01/2020 0627   CL 102 11/01/2020 0627   CO2 27 05/10/2020 0831   BUN 15 11/01/2020 0627   CREATININE 0.90 11/01/2020 0627      Component Value Date/Time   CALCIUM 9.3 05/10/2020 0831   ALKPHOS 79 05/10/2020 0831   AST 22 05/10/2020 0831   ALT 26 05/10/2020 0831   BILITOT 0.5 05/10/2020 0831     Lab Results  Component Value Date   WBC 7.4 05/10/2020   HGB 14.3 11/01/2020   HCT 42.0 11/01/2020   MCV 88.7 05/10/2020   PLT 243.0 05/10/2020    IMPRESSION AND PLAN:  No problem-specific Assessment & Plan notes found for this encounter.   An After Visit Summary was printed and given to the patient.  FOLLOW UP: No follow-ups on file.  Signed:  05/12/2020, MD           11/04/2020

## 2020-11-04 NOTE — Telephone Encounter (Signed)
Please advise, thanks.

## 2020-11-04 NOTE — Telephone Encounter (Signed)
FYI, requesting office note from today.

## 2020-11-04 NOTE — Telephone Encounter (Signed)
Noted  

## 2020-11-04 NOTE — Progress Notes (Signed)
Patient is at Urology clinic.

## 2020-11-04 NOTE — Telephone Encounter (Signed)
Patient followed up with Urology today and will call back to reschedule appt with Dr. Milinda Cave.

## 2020-11-04 NOTE — Telephone Encounter (Signed)
Verbally discussed with PCP. Ok to reschedule BP follow up, needs to follow up with urology.

## 2020-11-08 ENCOUNTER — Other Ambulatory Visit: Payer: Self-pay | Admitting: Family Medicine

## 2020-11-10 ENCOUNTER — Other Ambulatory Visit: Payer: Self-pay | Admitting: Family Medicine

## 2020-11-11 MED ORDER — METHYLPHENIDATE HCL ER (OSM) 54 MG PO TBCR: 54.0000 mg | EXTENDED_RELEASE_TABLET | ORAL | 0 refills | Status: DC

## 2020-11-11 NOTE — Telephone Encounter (Signed)
OK, 90d supply to mail order eRx'd today. However, cannot do local pharmacy rx at same time b/c of controlled substance rules.

## 2020-11-11 NOTE — Telephone Encounter (Signed)
Requesting: Methylphenidate 54mg  Contract: 05/10/20 UDS: 10/11/18 Last Visit: 10/14/20 Next Visit: 05/16/21 Last Refill: 07/28/20(90,0)  Please Advise. Medication pending for 90 day supply to mail order but pt would like 10 day supply sent to local pharmacy until mail order received.

## 2020-11-19 DIAGNOSIS — R3912 Poor urinary stream: Secondary | ICD-10-CM | POA: Diagnosis not present

## 2020-11-19 DIAGNOSIS — N401 Enlarged prostate with lower urinary tract symptoms: Secondary | ICD-10-CM | POA: Diagnosis not present

## 2020-11-19 DIAGNOSIS — R310 Gross hematuria: Secondary | ICD-10-CM | POA: Diagnosis not present

## 2020-11-26 DIAGNOSIS — L814 Other melanin hyperpigmentation: Secondary | ICD-10-CM | POA: Diagnosis not present

## 2020-11-26 DIAGNOSIS — X32XXXS Exposure to sunlight, sequela: Secondary | ICD-10-CM | POA: Diagnosis not present

## 2020-12-01 ENCOUNTER — Other Ambulatory Visit: Payer: Self-pay | Admitting: Family Medicine

## 2020-12-02 ENCOUNTER — Other Ambulatory Visit: Payer: Self-pay

## 2020-12-02 MED ORDER — AMLODIPINE BESYLATE 5 MG PO TABS: 5.0000 mg | ORAL_TABLET | Freq: Every day | ORAL | 0 refills | Status: DC

## 2020-12-17 ENCOUNTER — Other Ambulatory Visit: Payer: Self-pay | Admitting: Family Medicine

## 2021-01-31 DIAGNOSIS — N401 Enlarged prostate with lower urinary tract symptoms: Secondary | ICD-10-CM | POA: Diagnosis not present

## 2021-01-31 DIAGNOSIS — R3912 Poor urinary stream: Secondary | ICD-10-CM | POA: Diagnosis not present

## 2021-01-31 DIAGNOSIS — R972 Elevated prostate specific antigen [PSA]: Secondary | ICD-10-CM | POA: Diagnosis not present

## 2021-02-24 ENCOUNTER — Other Ambulatory Visit: Payer: Self-pay | Admitting: Family Medicine

## 2021-02-25 NOTE — Telephone Encounter (Signed)
Requesting: Methylphenidate Contract: 05/10/20 UDS: 10/11/18 Last Visit:10/14/20 Next Visit: 05/16/21 Last Refill: 11/11/20(90,0)  Please Advise. Medication pending

## 2021-02-26 MED ORDER — METHYLPHENIDATE HCL ER (OSM) 54 MG PO TBCR: 54.0000 mg | EXTENDED_RELEASE_TABLET | ORAL | 0 refills | Status: DC

## 2021-04-04 ENCOUNTER — Telehealth: Payer: Self-pay

## 2021-04-04 DIAGNOSIS — N401 Enlarged prostate with lower urinary tract symptoms: Secondary | ICD-10-CM | POA: Diagnosis not present

## 2021-04-04 DIAGNOSIS — R3912 Poor urinary stream: Secondary | ICD-10-CM | POA: Diagnosis not present

## 2021-04-04 MED ORDER — METHYLPHENIDATE HCL ER (OSM) 54 MG PO TBCR: 54.0000 mg | EXTENDED_RELEASE_TABLET | ORAL | 0 refills | Status: DC

## 2021-04-04 MED ORDER — METHYLPHENIDATE HCL ER (OSM) 54 MG PO TBCR
54.0000 mg | EXTENDED_RELEASE_TABLET | ORAL | 0 refills | Status: DC
Start: 2021-04-04 — End: 2021-05-16

## 2021-04-04 NOTE — Telephone Encounter (Signed)
[facility] However, due to controlled substance rules/restrictions I can only send in a 7d supply of this med to his local pharmacy while he is awaiting delivery of mail order--sent to [facility] [place].

## 2021-04-04 NOTE — Telephone Encounter (Signed)
Pt was advised of med refill and location

## 2021-04-04 NOTE — Telephone Encounter (Signed)
Patient refill request. 2 prescriptions for same med.  methylphenidate 54 MG PO CR tablet [[id number]]   30 d/s  [facility] [place]     methylphenidate 54 MG PO CR tablet [[id number]]  90 d/s [facility] - please put add to prescription. PLEASE PUT ON FILE ONLY.  So they can have for his next fill in September.  [facility]

## 2021-04-04 NOTE — Telephone Encounter (Signed)
Refill request for:  Methylphenidate 54 mg  LR 02/26/21, #90, 0 rf (only received #30 tabs [facility]) LOV 10/14/20 FOV  05/16/21  [facility] for future refills.   Please review and advise.   Thanks. Dm/cma

## 2021-05-13 ENCOUNTER — Other Ambulatory Visit: Payer: Self-pay

## 2021-05-14 DIAGNOSIS — M79645 Pain in left finger(s): Secondary | ICD-10-CM | POA: Diagnosis not present

## 2021-05-14 DIAGNOSIS — Z96652 Presence of left artificial knee joint: Secondary | ICD-10-CM | POA: Diagnosis not present

## 2021-05-16 ENCOUNTER — Ambulatory Visit (INDEPENDENT_AMBULATORY_CARE_PROVIDER_SITE_OTHER): Payer: BC Managed Care – PPO | Admitting: Family Medicine

## 2021-05-16 ENCOUNTER — Encounter: Payer: Self-pay | Admitting: Family Medicine

## 2021-05-16 ENCOUNTER — Other Ambulatory Visit: Payer: Self-pay

## 2021-05-16 VITALS — BP 149/89 | HR 71 | Temp 97.7°F | Ht 71.75 in | Wt 256.2 lb

## 2021-05-16 DIAGNOSIS — Z1159 Encounter for screening for other viral diseases: Secondary | ICD-10-CM

## 2021-05-16 DIAGNOSIS — Z0000 Encounter for general adult medical examination without abnormal findings: Secondary | ICD-10-CM | POA: Diagnosis not present

## 2021-05-16 DIAGNOSIS — F988 Other specified behavioral and emotional disorders with onset usually occurring in childhood and adolescence: Secondary | ICD-10-CM

## 2021-05-16 DIAGNOSIS — I10 Essential (primary) hypertension: Secondary | ICD-10-CM

## 2021-05-16 DIAGNOSIS — Z125 Encounter for screening for malignant neoplasm of prostate: Secondary | ICD-10-CM

## 2021-05-16 DIAGNOSIS — Z114 Encounter for screening for human immunodeficiency virus [HIV]: Secondary | ICD-10-CM

## 2021-05-16 DIAGNOSIS — Z79899 Other long term (current) drug therapy: Secondary | ICD-10-CM

## 2021-05-16 DIAGNOSIS — E785 Hyperlipidemia, unspecified: Secondary | ICD-10-CM

## 2021-05-16 DIAGNOSIS — Z23 Encounter for immunization: Secondary | ICD-10-CM | POA: Diagnosis not present

## 2021-05-16 LAB — COMPREHENSIVE METABOLIC PANEL
ALT: 28 U/L (ref 0–53)
AST: 23 U/L (ref 0–37)
Albumin: 4.5 g/dL (ref 3.5–5.2)
Alkaline Phosphatase: 72 U/L (ref 39–117)
BUN: 17 mg/dL (ref 6–23)
CO2: 28 mEq/L (ref 19–32)
Calcium: 9.6 mg/dL (ref 8.4–10.5)
Chloride: 105 mEq/L (ref 96–112)
Creatinine, Ser: 0.91 mg/dL (ref 0.40–1.50)
GFR: 89.73 mL/min (ref >60.00)
Glucose, Bld: 89 mg/dL (ref 70–99)
Potassium: 4.5 mEq/L (ref 3.5–5.1)
Sodium: 142 mEq/L (ref 135–145)
Total Bilirubin: 0.5 mg/dL (ref 0.2–1.2)
Total Protein: 6.8 g/dL (ref 6.0–8.3)

## 2021-05-16 LAB — LIPID PANEL
Cholesterol: 172 mg/dL (ref 0–200)
HDL: 52.8 mg/dL (ref >39.00)
LDL Cholesterol: 103 mg/dL — ABNORMAL HIGH (ref 0–99)
NonHDL: 118.74
Total CHOL/HDL Ratio: 3
Triglycerides: 81 mg/dL (ref 0.0–149.0)
VLDL: 16.2 mg/dL (ref 0.0–40.0)

## 2021-05-16 LAB — CBC WITH DIFFERENTIAL/PLATELET
Basophils Absolute: 0 10*3/uL (ref 0.0–0.1)
Basophils Relative: 0.4 % (ref 0.0–3.0)
Eosinophils Absolute: 0.2 10*3/uL (ref 0.0–0.7)
Eosinophils Relative: 3.3 % (ref 0.0–5.0)
HCT: 44.4 % (ref 39.0–52.0)
Hemoglobin: 14.8 g/dL (ref 13.0–17.0)
Lymphocytes Relative: 20.7 % (ref 12.0–46.0)
Lymphs Abs: 1.3 10*3/uL (ref 0.7–4.0)
MCHC: 33.2 g/dL (ref 30.0–36.0)
MCV: 88.4 fl (ref 78.0–100.0)
Monocytes Absolute: 0.5 10*3/uL (ref 0.1–1.0)
Monocytes Relative: 8.1 % (ref 3.0–12.0)
Neutro Abs: 4.3 10*3/uL (ref 1.4–7.7)
Neutrophils Relative %: 67.5 % (ref 43.0–77.0)
Platelets: 257 10*3/uL (ref 150.0–400.0)
RBC: 5.02 Mil/uL (ref 4.22–5.81)
RDW: 14.8 % (ref 11.5–15.5)
WBC: 6.4 10*3/uL (ref 4.0–10.5)

## 2021-05-16 LAB — PSA: PSA: 10.25 ng/mL — ABNORMAL HIGH (ref 0.10–4.00)

## 2021-05-16 LAB — TSH: TSH: 3.37 u[IU]/mL (ref 0.35–5.50)

## 2021-05-16 MED ORDER — METHYLPHENIDATE HCL 10 MG PO TABS: ORAL_TABLET | ORAL | 0 refills | Status: DC

## 2021-05-16 MED ORDER — METHYLPHENIDATE HCL ER (OSM) 54 MG PO TBCR: 54.0000 mg | EXTENDED_RELEASE_TABLET | ORAL | 0 refills | Status: DC

## 2021-05-16 MED ORDER — LISINOPRIL 10 MG PO TABS: 10.0000 mg | ORAL_TABLET | Freq: Every day | ORAL | 1 refills | Status: DC

## 2021-05-16 NOTE — Progress Notes (Signed)
Office Note 05/16/2021  CC:  Chief Complaint  Patient presents with   Annual Exam    Pt is fasting    HPI:  Patient is a 65 y.o. male who is here for annual health maintenance exam and 6 mo f/u HTN and adult ADD. A/P as of last visit: "1) HTN, time to start med. Start amlodipine 60m, monitor bp/hr at home and return to go over these with me in 2-3 wks. Therapeutic expectations and side effect profile of medication discussed today.  Patient's questions answered.   2) Adult ADD: doing well on methylphenidate ER 519mqd and most days takes a 101mR methylphenidate around 3 pm. Will get CSC at next f/u in 2-3 wks. No new rx's needed today."  INTERIM HX: Feeling well.  [place] by [facility] so they like being closer to them.  Amlodipine caused disequilibrium sensation plus he didn't note any bp improvement on it. He stopped it and disequ resolved. Home bp's still 140/80s avg.  Adult ADD: Pt states all is going well with the med at current dosing: much improved focus, concentration, task completion.  Less frustration, better multitasking, less impulsivity and restlessness.  Mood is stable. No side effects from the medication.     PMP AWARE reviewed today: most recent rx for methylphenidate was filled 04/13/21, # 90,63x by me. No red flags.  Past Medical History:  Diagnosis Date   Acne    Dr. SmiTamala Julianas on accutane at one point)   Adult ADHD 11/2017   Borderline hyperlipidemia    BPH with obstruction/lower urinary tract symptoms 2015   finasteride trial 09/2015. Tamsulosin added 05/2019 urol->Surgical options discussed at that time.   Condyloma acuminata 07/2019   responded well to imiquimod   COVID 08/2019   high fever lethargic weakness x 5 days all symptoms resolved   Depression    Zoloft 2005   Diverticulosis 08/2019   Noted on screening colonoscopy   Elevated PSA 2015   Bx benign  12/2014.  04/2016 prostate MRI negative. PSA stable at 4.29 as of 02/2018.  PSA doubled 08/2018-->13.2 02/2019->rpt bx benign. PSAs followed by uroBurman [facility] Episcleritis of right eye    w/mild scleritis right eye ([facility] opht 2011); also with hx of ocular varicella at age [age] GERD (gastroesophageal reflux disease)    Tums   Gross hematuria summer 2021   urol->w/u-> Bladder stones. pt to get cystolithopaxy as of 09/27/20   History of kidney stones 25 yrs ago   Hypertension    Hypogonadism male 2009   Nephrolithiasis    Osteoarthritis of left knee    Dr. RowMayer CamelSkin abnormality 10/28/2020   right thigh ingrown hair on doxycycline healing well per pt   Wears glasses     Past Surgical History:  Procedure Laterality Date   COLONOSCOPY  09/22/2019   X 2, normal (for strong FH of colon cancer.  Last in approx 2010 was normal.  Rpt 08/2019 NO POLYPS->repeat 10 yrs   CYSTOSCOPY WITH INSERTION OF UROLIFT  11/01/2020   Procedure: CYSTOSCOPY WITH INSERTION OF UROLIFT;  Surgeon: EskFestus AloeD;  [facility];;   CYSTOSCOPY WITH LITHOLAPAXY N/A 11/01/2020   Procedure: CYSTOSCOPY WITH LITHOLAPAXY;  Surgeon: EskFestus AloeD;  [facility];  Laterality: N/A;   FRACTURE SURGERY Left 1974   wrist  KNEE ARTHROSCOPY     Left: 1990s and early 2000's.   LITHOTRIPSY     1990s   PROSTATE BIOPSY  12/2014; 02/2019   2016 benign.  02/2019 benign.   ROTATOR CUFF REPAIR Right 2004   Right   THROAT SURGERY     Infected branchial cleft cyst 2007   TOTAL KNEE ARTHROPLASTY Left 03/10/2016   Procedure: TOTAL KNEE ARTHROPLASTY;  Surgeon: Melrose Nakayama, MD;  Location: [place];  Service: Orthopedics;  Laterality: Left;    Family History  Problem Relation Age of Onset   Cancer Paternal Grandfather        either prostate, colon or something in that area - not definitive dx of cancer    Multiple myeloma Brother    Colon cancer  Neg Hx    Colon polyps Neg Hx    Esophageal cancer Neg Hx    Rectal cancer Neg Hx    Stomach cancer Neg Hx     Social History   Socioeconomic History   Marital status: Married    Spouse name: Not on file   Number of children: Not on file   Years of education: Not on file   Highest education level: Not on file  Occupational History   Not on file  Tobacco Use   Smoking status: Never   Smokeless tobacco: Never  Vaping Use   Vaping Use: Never used  Substance and Sexual Activity   Alcohol use: Yes    Alcohol/week: 3.0 standard drinks    Types: 3 Glasses of wine per week    Comment: few week   Drug use: No   Sexual activity: Not on file  Other Topics Concern   Not on file  Social History Narrative   Married, 2 teenage children.   Relocated to [place] from [place] 2007.   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   No tobacco.  Drinks 2-3 glasses of wine per week.  No drugs.   Walks his dog about 1 mile per day.   Social Determinants of Health   Financial Resource Strain: Not on file  Food Insecurity: Not on file  Transportation Needs: Not on file  Physical Activity: Not on file  Stress: Not on file  Social Connections: Not on file  Intimate Partner Violence: Not on file    Outpatient Medications Prior to Visit  Medication Sig Dispense Refill   naproxen (NAPROSYN) 500 MG tablet Take 500 mg by mouth daily.     methylphenidate (RITALIN) 10 MG tablet 1 tab po q Afternoon as needed 90 tablet 0   methylphenidate 54 MG PO CR tablet Take 1 tablet (54 mg total) by mouth every morning. 7 tablet 0   amLODipine (NORVASC) 5 MG tablet Take 1 tablet (5 mg total) by mouth daily. (Patient not taking: Reported on 05/16/2021) 30 tablet 0   doxycycline (VIBRAMYCIN) 100 MG capsule Take 100 mg by mouth 2 (two) times daily. For ingrown hair on right thigh healing well (Patient not taking: Reported on 05/16/2021)     finasteride (PROSCAR) 5  MG tablet Take 5 mg by mouth daily. (Patient not taking: Reported on 05/16/2021)  11   HYDROcodone-acetaminophen (NORCO) 7.5-325 MG tablet Take 1 tablet by mouth every 6 (six) hours as needed for moderate pain. (Patient not taking: Reported on 05/16/2021) 10 tablet 0   oxybutynin (DITROPAN) 5 MG tablet Take 1 tablet (5 mg total) by mouth 3 (three) times daily as  needed for bladder spasms. Do not take Sunday night or Monday prior to foley removal (Patient not taking: Reported on 05/16/2021) 10 tablet 0   promethazine (PHENERGAN) 12.5 MG tablet Take 1 tablet (12.5 mg total) by mouth every 8 (eight) hours as needed for nausea or vomiting. (Patient not taking: Reported on 05/16/2021) 3 tablet 0   tamsulosin (FLOMAX) 0.4 MG CAPS capsule Take 0.4 mg by mouth. (Patient not taking: Reported on 05/16/2021)     No facility-administered medications prior to visit.    Allergies  Allergen Reactions   Sulfa Antibiotics Rash    ROS Review of Systems  Constitutional:  Negative for appetite change, chills, fatigue and fever.  HENT:  Negative for congestion, dental problem, ear pain and sore throat.   Eyes:  Negative for discharge, redness and visual disturbance.  Respiratory:  Negative for cough, chest tightness, shortness of breath and wheezing.   Cardiovascular:  Negative for chest pain, palpitations and leg swelling.  Gastrointestinal:  Negative for abdominal pain, blood in stool, diarrhea, nausea and vomiting.  Genitourinary:  Negative for difficulty urinating, dysuria, flank pain, frequency, hematuria and urgency.  Musculoskeletal:  Negative for arthralgias, back pain, joint swelling, myalgias and neck stiffness.  Skin:  Negative for pallor and rash.  Neurological:  Negative for dizziness, speech difficulty, weakness and headaches.  Hematological:  Negative for adenopathy. Does not bruise/bleed easily.  Psychiatric/Behavioral:  Negative for confusion and sleep disturbance. The patient is not  nervous/anxious.    PE; Vitals with BMI 05/16/2021 11/03/2020 11/03/2020  Height 5' 11.75" - _0   Weight 256 lbs 3 oz - 260 lbs  BMI 96.29 - 52.84  Systolic 132 440 102  Diastolic 89 81 88  Pulse 71 64 80    Gen: Alert, well appearing.  Patient is oriented to person, place, time, and situation. AFFECT: pleasant, lucid thought and speech. ENT: Ears: EACs clear, normal epithelium.  TMs with good light reflex and landmarks bilaterally.  Eyes: no injection, icteris, swelling, or exudate.  EOMI, PERRLA. Nose: no drainage or turbinate edema/swelling.  No injection or focal lesion.  Mouth: lips without lesion/swelling.  Oral mucosa pink and moist.  Dentition intact and without obvious caries or gingival swelling.  Oropharynx without erythema, exudate, or swelling.  Neck: supple/nontender.  No LAD, mass, or TM.  Carotid pulses 2+ bilaterally, without bruits. CV: RRR, no m/r/g.   LUNGS: CTA bilat, nonlabored resps, good aeration in all lung fields. ABD: soft, NT, ND, BS normal.  No hepatospenomegaly or mass.  No bruits. EXT: no clubbing, cyanosis, or edema.  Musculoskeletal: no joint swelling, erythema, warmth, or tenderness.  ROM of all joints intact. Skin - no sores or suspicious lesions or rashes or color changes  Pertinent labs:  Lab Results  Component Value Date   TSH 4.09 05/10/2020   Lab Results  Component Value Date   WBC 7.4 05/10/2020   HGB 14.3 11/01/2020   HCT 42.0 11/01/2020   MCV 88.7 05/10/2020   PLT 243.0 05/10/2020   Lab Results  Component Value Date   CREATININE 0.90 11/01/2020   BUN 15 11/01/2020   NA 141 11/01/2020   K 4.0 11/01/2020   CL 102 11/01/2020   CO2 27 05/10/2020   Lab Results  Component Value Date   ALT 26 05/10/2020   AST 22 05/10/2020   ALKPHOS 79 05/10/2020   BILITOT 0.5 05/10/2020   Lab Results  Component Value Date   CHOL 173 05/10/2020   Lab Results  Component  Value Date   HDL 47.30 05/10/2020   Lab Results  Component Value Date    LDLCALC 103 (H) 05/10/2020   Lab Results  Component Value Date   TRIG 114.0 05/10/2020   Lab Results  Component Value Date   CHOLHDL 4 05/10/2020   Lab Results  Component Value Date   PSA 8.32 09/27/2020   PSA 8.32 09/27/2020   PSA 7.27 (H) 05/10/2020   ASSESSMENT AND PLAN:   1) HTN: intol amlod. Trial of lisinopril 28m qd started today. [place] work for [employer] and won't be back until mid oct. F/u 1 mo and recheck lytes/cr at that time. Lytes/cr today.  2) Adult ADD: stable. I did electronic rx's for methylphenidate CR 565m 1qd and ritalin 1047m1 q afternoon, 90d supply of each.  CSC today.  3) Health maintenance exam: Reviewed age and gender appropriate health maintenance issues (prudent diet, regular exercise, health risks of tobacco and excessive alcohol, use of seatbelts, fire alarms in home, use of sunscreen).  Also reviewed age and gender appropriate health screening as well as vaccine recommendations. Vaccines: flu->given today.  Otherwise UTD. Labs: fasting HP ordered.  HIV and Hep C screening. Prostate ca screening: hx elev PSAs, hx of benign prostate bx, PSAs followed by urology but he asks to have this checked here today as well. Colon ca screening: recall 2031.  An After Visit Summary was printed and given to the patient.  FOLLOW UP:  Return in about 4 weeks (around 06/13/2021) for f/u HTN.  Signed:  PhiCrissie SicklesD           05/16/2021

## 2021-05-16 NOTE — Patient Instructions (Signed)
Health Maintenance, Male Adopting a healthy lifestyle and getting preventive care are important in promoting health and wellness. Ask your health care provider about: The right schedule for you to have regular tests and exams. Things you can do on your own to prevent diseases and keep yourself healthy. What should I know about diet, weight, and exercise? Eat a healthy diet  Eat a diet that includes plenty of vegetables, fruits, low-fat dairy products, and lean protein. Do not eat a lot of foods that are high in solid fats, added sugars, or sodium. Maintain a healthy weight Body mass index (BMI) is a measurement that can be used to identify possible weight problems. It estimates body fat based on height and weight. Your health care provider can help determine your BMI and help you achieve or maintain a healthy weight. Get regular exercise Get regular exercise. This is one of the most important things you can do for your health. Most adults should: Exercise for at least 150 minutes each week. The exercise should increase your heart rate and make you sweat (moderate-intensity exercise). Do strengthening exercises at least twice a week. This is in addition to the moderate-intensity exercise. Spend less time sitting. Even light physical activity can be beneficial. Watch cholesterol and blood lipids Have your blood tested for lipids and cholesterol at 63 years of age, then have this test every 5 years. You may need to have your cholesterol levels checked more often if: Your lipid or cholesterol levels are high. You are older than 63 years of age. You are at high risk for heart disease. What should I know about cancer screening? Many types of cancers can be detected early and may often be prevented. Depending on your health history and family history, you may need to have cancer screening at various ages. This may include screening for: Colorectal cancer. Prostate cancer. Skin cancer. Lung  cancer. What should I know about heart disease, diabetes, and high blood pressure? Blood pressure and heart disease High blood pressure causes heart disease and increases the risk of stroke. This is more likely to develop in people who have high blood pressure readings, are of African descent, or are overweight. Talk with your health care provider about your target blood pressure readings. Have your blood pressure checked: Every 3-5 years if you are 18-39 years of age. Every year if you are 40 years old or older. If you are between the ages of 65 and 75 and are a current or former smoker, ask your health care provider if you should have a one-time screening for abdominal aortic aneurysm (AAA). Diabetes Have regular diabetes screenings. This checks your fasting blood sugar level. Have the screening done: Once every three years after age 45 if you are at a normal weight and have a low risk for diabetes. More often and at a younger age if you are overweight or have a high risk for diabetes. What should I know about preventing infection? Hepatitis B If you have a higher risk for hepatitis B, you should be screened for this virus. Talk with your health care provider to find out if you are at risk for hepatitis B infection. Hepatitis C Blood testing is recommended for: Everyone born from 1945 through 1965. Anyone with known risk factors for hepatitis C. Sexually transmitted infections (STIs) You should be screened each year for STIs, including gonorrhea and chlamydia, if: You are sexually active and are younger than 63 years of age. You are older than 63 years   of age and your health care provider tells you that you are at risk for this type of infection. Your sexual activity has changed since you were last screened, and you are at increased risk for chlamydia or gonorrhea. Ask your health care provider if you are at risk. Ask your health care provider about whether you are at high risk for HIV.  Your health care provider may recommend a prescription medicine to help prevent HIV infection. If you choose to take medicine to prevent HIV, you should first get tested for HIV. You should then be tested every 3 months for as long as you are taking the medicine. Follow these instructions at home: Lifestyle Do not use any products that contain nicotine or tobacco, such as cigarettes, e-cigarettes, and chewing tobacco. If you need help quitting, ask your health care provider. Do not use street drugs. Do not share needles. Ask your health care provider for help if you need support or information about quitting drugs. Alcohol use Do not drink alcohol if your health care provider tells you not to drink. If you drink alcohol: Limit how much you have to 0-2 drinks a day. Be aware of how much alcohol is in your drink. In the [place] one drink equals one 12 oz bottle of beer (355 mL), one 5 oz glass of wine (148 mL), or one 1 oz glass of hard liquor (44 mL). General instructions Schedule regular health, dental, and eye exams. Stay current with your vaccines. Tell your health care provider if: You often feel depressed. You have ever been abused or do not feel safe at home. Summary Adopting a healthy lifestyle and getting preventive care are important in promoting health and wellness. Follow your health care provider's instructions about healthy diet, exercising, and getting tested or screened for diseases. Follow your health care provider's instructions on monitoring your cholesterol and blood pressure. This information is not intended to replace advice given to you by your health care provider. Make sure you discuss any questions you have with your health care provider. Document Revised: 10/18/2020 Document Reviewed: 08/03/2018 Elsevier Patient Education  2022 Elsevier Inc.  

## 2021-05-16 NOTE — Addendum Note (Signed)
Addended by: Jeoffrey Massed on: 05/16/2021 08:50 AM   Modules accepted: Orders

## 2021-05-19 LAB — HEPATITIS C ANTIBODY
Hepatitis C Ab: NONREACTIVE
SIGNAL TO CUT-OFF: 0.05 (ref <1.00)

## 2021-05-19 LAB — HIV ANTIBODY (ROUTINE TESTING W REFLEX): HIV 1&2 Ab, 4th Generation: NONREACTIVE

## 2021-06-12 ENCOUNTER — Other Ambulatory Visit: Payer: Self-pay | Admitting: Family Medicine

## 2021-06-13 ENCOUNTER — Ambulatory Visit: Payer: BC Managed Care – PPO | Admitting: Family Medicine

## 2021-06-27 ENCOUNTER — Other Ambulatory Visit: Payer: Self-pay | Admitting: Family Medicine

## 2021-07-14 DIAGNOSIS — H5213 Myopia, bilateral: Secondary | ICD-10-CM | POA: Diagnosis not present

## 2021-07-25 ENCOUNTER — Other Ambulatory Visit: Payer: Self-pay

## 2021-07-25 ENCOUNTER — Encounter: Payer: Self-pay | Admitting: Family Medicine

## 2021-07-25 ENCOUNTER — Ambulatory Visit (INDEPENDENT_AMBULATORY_CARE_PROVIDER_SITE_OTHER): Payer: BC Managed Care – PPO | Admitting: Family Medicine

## 2021-07-25 VITALS — BP 133/77 | HR 76 | Temp 97.8°F | Ht 71.75 in | Wt 260.2 lb

## 2021-07-25 DIAGNOSIS — F988 Other specified behavioral and emotional disorders with onset usually occurring in childhood and adolescence: Secondary | ICD-10-CM | POA: Diagnosis not present

## 2021-07-25 DIAGNOSIS — I10 Essential (primary) hypertension: Secondary | ICD-10-CM

## 2021-07-25 LAB — BASIC METABOLIC PANEL
BUN: 20 mg/dL (ref 6–23)
CO2: 27 mEq/L (ref 19–32)
Calcium: 9.3 mg/dL (ref 8.4–10.5)
Chloride: 104 mEq/L (ref 96–112)
Creatinine, Ser: 0.94 mg/dL (ref 0.40–1.50)
GFR: 86.19 mL/min (ref >60.00)
Glucose, Bld: 91 mg/dL (ref 70–99)
Potassium: 4.4 mEq/L (ref 3.5–5.1)
Sodium: 138 mEq/L (ref 135–145)

## 2021-07-25 MED ORDER — LISINOPRIL 10 MG PO TABS
10.0000 mg | ORAL_TABLET | Freq: Every day | ORAL | 3 refills | Status: DC
Start: 2021-07-25 — End: 2022-05-05

## 2021-07-25 NOTE — Progress Notes (Signed)
OFFICE VISIT  07/25/2021  CC:  Chief Complaint  Patient presents with   Follow-up    Hypertension; pt is fasting   HPI:    Patient is a 65 y.o. male who presents for 2 mo f/u HTN. A/P as of last visit: "1) HTN: intol amlod. Trial of lisinopril 10mg  qd started today. He'll be moving his [place] out west to work for [employer] soon and won't be back until mid oct. F/u 1 mo and recheck lytes/cr at that time. Lytes/cr today.   2) Adult ADD: stable. I did electronic rx's for methylphenidate CR 54mg , 1qd and ritalin 10mg , 1 q afternoon, 90d supply of each.  CSC today.   3) Health maintenance exam: Reviewed age and gender appropriate health maintenance issues (prudent diet, regular exercise, health risks of tobacco and excessive alcohol, use of seatbelts, fire alarms in home, use of sunscreen).  Also reviewed age and gender appropriate health screening as well as vaccine recommendations. Vaccines: flu->given today.  Otherwise UTD. Labs: fasting HP ordered.  HIV and Hep C screening. Prostate ca screening: hx elev PSAs, hx of benign prostate bx, PSAs followed by urology but he asks to have this checked here today as well. Colon ca screening: recall 2031."  INTERIM HX: All labs normal last visit except PSA up to 10.25-->result forwarded to his urologist. He did call his urologist office and his urologist were reassured him since this has been stable.  He has not been able to check his blood sugar due to being quite busy and distracted.  No side effects from lisinopril.  ADD: Pt states all is going well with the med at current dosing: much improved focus, concentration, task completion.  Less frustration, better multitasking, less impulsivity and restlessness.  Mood is stable. No side effects from the medication.  He takes a 54 mg methylphenidate CR every morning and a 10 mg methylphenidate IR/short acting every afternoon.   PMP AWARE reviewed today: most recent rx for methylphenidate CR 54  mg was filled 06/25/21, # 11, rx by me. Most recent methylphenidate 10mg  filled 05/25/21, #90, rx by me. No red flags.  Past Medical History:  Diagnosis Date   Acne    Dr. Tamala Julian (was on accutane at one point)   Adult ADHD 11/2017   Borderline hyperlipidemia    BPH with obstruction/lower urinary tract symptoms 2015   finasteride trial 09/2015. Tamsulosin added 05/2019 urol->Surgical options discussed at that time.   Condyloma acuminata 07/2019   responded well to imiquimod   COVID 08/2019   high fever lethargic weakness x 5 days all symptoms resolved   Depression    Zoloft 2005   Diverticulosis 08/2019   Noted on screening colonoscopy   Elevated PSA 2015   Bx benign 12/2014.  04/2016 prostate MRI negative. PSA stable at 4.29 as of 02/2018.  PSA doubled 08/2018-->13.2 02/2019->rpt bx benign. PSAs followed by [facility].   Episcleritis of right eye    w/mild scleritis right eye ([facility] opht 2011); also with hx of ocular varicella at age 44.   GERD (gastroesophageal reflux disease)    Tums   Gross hematuria summer 2021   urol->w/u-> Bladder stones. pt to get cystolithopaxy as of 09/27/20   History of kidney stones 25 yrs ago   Hypertension    Hypogonadism male 2009   Nephrolithiasis    Osteoarthritis of left knee    Dr. Mayer Camel   Skin abnormality 10/28/2020   right thigh ingrown hair on doxycycline healing well per pt  Wears glasses     Past Surgical History:  Procedure Laterality Date   COLONOSCOPY  09/22/2019   X 2, normal (for strong FH of colon cancer.  Last in approx 2010 was normal.  Rpt 08/2019 NO POLYPS->repeat 10 yrs   CYSTOSCOPY WITH INSERTION OF UROLIFT  11/01/2020   Procedure: CYSTOSCOPY WITH INSERTION OF UROLIFT;  Surgeon: [name], MD;  Location: [facility];  Service: Urology;;   CYSTOSCOPY WITH LITHOLAPAXY N/A 11/01/2020   Procedure: CYSTOSCOPY WITH LITHOLAPAXY;  Surgeon: [name], MD;  Location: [facility];  Service: Urology;   Laterality: N/A;   FRACTURE SURGERY Left 1974   wrist    KNEE ARTHROSCOPY     Left: 1990s and early 2000's.   LITHOTRIPSY     1990s   PROSTATE BIOPSY  12/2014; 02/2019   2016 benign.  02/2019 benign.   ROTATOR CUFF REPAIR Right 2004   Right   THROAT SURGERY     Infected branchial cleft cyst 2007   TOTAL KNEE ARTHROPLASTY Left 03/10/2016   Procedure: TOTAL KNEE ARTHROPLASTY;  Surgeon: Marcene Corning, MD;  Location: [facility] OR;  Service: Orthopedics;  Laterality: Left;    Outpatient Medications Prior to Visit  Medication Sig Dispense Refill   methylphenidate (RITALIN) 10 MG tablet 1 tab po q Afternoon as needed 90 tablet 0   methylphenidate 54 MG PO CR tablet Take 1 tablet (54 mg total) by mouth every morning. 90 tablet 0   naproxen (NAPROSYN) 500 MG tablet Take 500 mg by mouth daily.     lisinopril (ZESTRIL) 10 MG tablet TAKE 1 TABLET BY MOUTH EVERY DAY 30 tablet 1   No facility-administered medications prior to visit.    Allergies  Allergen Reactions   Amlodipine Other (See Comments)    disequilibrium   Sulfa Antibiotics Rash    ROS As per HPI  PE: Vitals with BMI 07/25/2021 05/16/2021 11/03/2020  Height 5' 11.75" 5' 11.75" -  Weight 260 lbs 3 oz 256 lbs 3 oz -  BMI 35.55 35.01 -  Systolic 133 149 299  Diastolic 77 89 81  Pulse 76 71 64     General: Alert and well-appearing. Cardiovascular: Regular rhythm and rate without murmur rub or gallop. Lungs clear to auscultation bilaterally, nonlabored respirations. No edema in extremities.  LABS:  Lab Results  Component Value Date   TSH 3.37 05/16/2021   Lab Results  Component Value Date   WBC 6.4 05/16/2021   HGB 14.8 05/16/2021   HCT 44.4 05/16/2021   MCV 88.4 05/16/2021   PLT 257.0 05/16/2021   Lab Results  Component Value Date   CREATININE 0.91 05/16/2021   BUN 17 05/16/2021   NA 142 05/16/2021   K 4.5 05/16/2021   CL 105 05/16/2021   CO2 28 05/16/2021   Lab Results  Component Value Date   ALT 28  05/16/2021   AST 23 05/16/2021   ALKPHOS 72 05/16/2021   BILITOT 0.5 05/16/2021   Lab Results  Component Value Date   CHOL 172 05/16/2021   Lab Results  Component Value Date   HDL 52.80 05/16/2021   Lab Results  Component Value Date   LDLCALC 103 (H) 05/16/2021   Lab Results  Component Value Date   TRIG 81.0 05/16/2021   Lab Results  Component Value Date   CHOLHDL 3 05/16/2021   Lab Results  Component Value Date   PSA 10.25 (H) 05/16/2021   PSA 8.32 09/27/2020   PSA 8.32 09/27/2020  IMPRESSION AND PLAN:  #1 hypertension, well controlled.  Continue lisinopril 10 mg a day.  Electrolytes and creatinine checked today.  Try to monitor blood pressure at home periodically.  2.  Adult ADD: Stable.  No change in medications today. No new medication prescriptions today. Controlled substance contract up-to-date.  #3 elevated PSA.  Evaluation has been reassuring that this is benign.  He has follow-up with his urologist early next year.  An After Visit Summary was printed and given to the patient.  FOLLOW UP: Return in about 6 months (around 01/23/2022) for routine chronic illness f/u. Next cpe 04/2022  Signed:  Crissie Sickles, MD           07/25/2021

## 2021-08-15 ENCOUNTER — Other Ambulatory Visit: Payer: Self-pay | Admitting: Family Medicine

## 2021-10-03 DIAGNOSIS — Z96652 Presence of left artificial knee joint: Secondary | ICD-10-CM | POA: Diagnosis not present

## 2021-10-19 ENCOUNTER — Other Ambulatory Visit: Payer: Self-pay | Admitting: Family Medicine

## 2021-10-20 MED ORDER — METHYLPHENIDATE HCL 10 MG PO TABS: ORAL_TABLET | ORAL | 0 refills | Status: DC

## 2021-10-20 MED ORDER — METHYLPHENIDATE HCL ER (OSM) 54 MG PO TBCR: 54.0000 mg | EXTENDED_RELEASE_TABLET | ORAL | 0 refills | Status: DC

## 2021-10-20 NOTE — Telephone Encounter (Signed)
Requesting: Methylphenidate 10 & 54mg  Contract: 05/16/21 UDS: 10/11/18 Last Visit: 07/25/21 Next Visit: 01/23/22 Last Refill: 05/16/21(90,0)  Please Advise. Meds pending

## 2021-10-24 LAB — PSA: PSA: 11.7

## 2021-12-30 ENCOUNTER — Encounter: Payer: Self-pay | Admitting: Family Medicine

## 2021-12-30 ENCOUNTER — Ambulatory Visit (INDEPENDENT_AMBULATORY_CARE_PROVIDER_SITE_OTHER): Payer: BC Managed Care – PPO | Admitting: Family Medicine

## 2021-12-30 VITALS — BP 133/76 | HR 97 | Temp 98.0°F | Ht 71.75 in | Wt 261.0 lb

## 2021-12-30 DIAGNOSIS — B349 Viral infection, unspecified: Secondary | ICD-10-CM

## 2021-12-30 DIAGNOSIS — R051 Acute cough: Secondary | ICD-10-CM

## 2021-12-30 DIAGNOSIS — R197 Diarrhea, unspecified: Secondary | ICD-10-CM

## 2021-12-30 LAB — POC COVID19 BINAXNOW: SARS Coronavirus 2 Ag: NEGATIVE

## 2021-12-30 MED ORDER — IPRATROPIUM BROMIDE 0.06 % NA SOLN: 2.0000 | Freq: Four times a day (QID) | NASAL | 1 refills | Status: DC

## 2021-12-30 MED ORDER — HYDROCODONE BIT-HOMATROP MBR 5-1.5 MG/5ML PO SOLN: 5.0000 mL | Freq: Three times a day (TID) | ORAL | 0 refills | Status: DC | PRN

## 2021-12-30 MED ORDER — METHYLPREDNISOLONE ACETATE 80 MG/ML IJ SUSP
80.0000 mg | Freq: Once | INTRAMUSCULAR | Status: AC
  Administered 2021-12-30: 80 mg via INTRAMUSCULAR

## 2021-12-30 MED ORDER — AZITHROMYCIN 250 MG PO TABS: ORAL_TABLET | ORAL | 0 refills | Status: AC

## 2021-12-30 NOTE — Patient Instructions (Addendum)
Purchase:  ?Flonase nasal spray ?Mucinex DM ? ?Prescribed: ?Z-pack ( start only if needed) ?Hycodan cough syrup ?Atrovent nasal spray. ? ? ?Great to see you today.  ? ? ? ? ?

## 2021-12-30 NOTE — Progress Notes (Signed)
Carlos Ellis , Apr 01, 1956, 66 y.o., male MRN: [id number] Patient Care Team    Relationship Specialty Notifications Start End  McGowen, Maryjean Morn, MD PCP - General Family Medicine  05/18/11   [name], MD Consulting Physician Urology  09/18/14   Reginia Naas, MD Consulting Physician Dermatology  09/15/19   Beverley Fiedler, MD Consulting Physician Gastroenterology  09/27/19     Chief Complaint  Patient presents with   Cough    Pt c/o dry cough, post nasal drip, sweats, diarrhea x 7 days worsen at night ;     Subjective: Pt presents for an OV with complaints of cough of 7 days duration.  Associated symptoms include PND, sweats and diarrhea. His cough is worse at night and is causing him to lose sleep.  He reports the diarrhea was just 2 nights and he has not had any bowel movements today.  He has not been exposed to any sick contacts that he is aware of. Pt has tried over-the-counter cough syrup and Benadryl to ease their symptoms.      12/30/2021    8:07 AM 07/25/2021    8:49 AM 10/14/2020    9:55 AM 10/11/2018   10:35 AM 12/02/2017    8:55 AM  Depression screen PHQ 2/9  Decreased Interest 0 0 0 0 0  Down, Depressed, Hopeless 0 0 0 0 0  PHQ - 2 Score 0 0 0 0 0  Altered sleeping  0 2 0 1  Tired, decreased energy  0 0 0 0  Change in appetite  0 0 0 1  Feeling bad or failure about yourself   0 0 0 0  Trouble concentrating  0 0 0 2  Moving slowly or fidgety/restless  0 0 0 0  Suicidal thoughts  0 0 0 0  PHQ-9 Score  0 2 0 4  Difficult doing work/chores   Not difficult at all Not difficult at all Somewhat difficult    Allergies  Allergen Reactions   Amlodipine Other (See Comments)    disequilibrium   Sulfa Antibiotics Rash   Social History   Social History Narrative   Married, 2 teenage children.   Relocated to [place] from [place] 2007.   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   No tobacco.  Drinks 2-3  glasses of wine per week.  No drugs.   Walks his dog about 1 mile per day.   Past Medical History:  Diagnosis Date   Acne    Dr. Katrinka Blazing (was on accutane at one point)   Adult ADHD 11/2017   Borderline hyperlipidemia    BPH with obstruction/lower urinary tract symptoms 2015   finasteride trial 09/2015. Tamsulosin added 05/2019 urol->Surgical options discussed at that time.   Condyloma acuminata 07/2019   responded well to imiquimod   COVID 08/2019   high fever lethargic weakness x 5 days all symptoms resolved   Depression    Zoloft 2005   Diverticulosis 08/2019   Noted on screening colonoscopy   Elevated PSA 2015   Bx benign 12/2014.  04/2016 prostate MRI negative. PSA stable at 4.29 as of 02/2018.  PSA doubled 08/2018-->13.2 02/2019->rpt bx benign. PSAs followed by [facility].   Episcleritis of right eye    w/mild scleritis right eye ([facility] opht 2011); also with hx of ocular varicella at age 57.   GERD (gastroesophageal reflux disease)    Tums   Gross  hematuria summer 2021   urol->w/u-> Bladder stones. pt to get cystolithopaxy as of 09/27/20   History of kidney stones 25 yrs ago   Hypertension    Hypogonadism male 2009   Nephrolithiasis    Osteoarthritis of left knee    Dr. Turner Daniels   Skin abnormality 10/28/2020   right thigh ingrown hair on doxycycline healing well per pt   Wears glasses    Past Surgical History:  Procedure Laterality Date   COLONOSCOPY  09/22/2019   X 2, normal (for strong FH of colon cancer.  Last in approx 2010 was normal.  Rpt 08/2019 NO POLYPS->repeat 10 yrs   CYSTOSCOPY WITH INSERTION OF UROLIFT  11/01/2020   Procedure: CYSTOSCOPY WITH INSERTION OF UROLIFT;  Surgeon: [name], MD;  Location: [facility];  Service: Urology;;   CYSTOSCOPY WITH LITHOLAPAXY N/A 11/01/2020   Procedure: CYSTOSCOPY WITH LITHOLAPAXY;  Surgeon: [name], MD;  Location: [facility];  Service: Urology;  Laterality: N/A;   FRACTURE SURGERY Left 1974    wrist    KNEE ARTHROSCOPY     Left: 1990s and early 2000's.   LITHOTRIPSY     1990s   PROSTATE BIOPSY  12/2014; 02/2019   2016 benign.  02/2019 benign.   ROTATOR CUFF REPAIR Right 2004   Right   THROAT SURGERY     Infected branchial cleft cyst 2007   TOTAL KNEE ARTHROPLASTY Left 03/10/2016   Procedure: TOTAL KNEE ARTHROPLASTY;  Surgeon: Marcene Corning, MD;  Location: [facility];  Service: Orthopedics;  Laterality: Left;   Family History  Problem Relation Age of Onset   Cancer Paternal Grandfather        either prostate, colon or something in that area - not definitive dx of cancer    Multiple myeloma Brother    Colon cancer Neg Hx    Colon polyps Neg Hx    Esophageal cancer Neg Hx    Rectal cancer Neg Hx    Stomach cancer Neg Hx    Allergies as of 12/30/2021       Reactions   Amlodipine Other (See Comments)   disequilibrium   Sulfa Antibiotics Rash        Medication List        Accurate as of Dec 30, 2021  9:54 AM. If you have any questions, ask your nurse or doctor.          azithromycin 250 MG tablet Commonly known as: ZITHROMAX Take 2 tablets on day 1, then 1 tablet daily on days 2 through 5 Started by: Felix Pacini, DO   Denta 5000 Plus 1.1 % Crea dental cream Generic drug: sodium fluoride See admin instructions.   HYDROcodone bit-homatropine 5-1.5 MG/5ML syrup Commonly known as: HYCODAN Take 5 mLs by mouth every 8 (eight) hours as needed for cough. Started by: Felix Pacini, DO   ipratropium 0.06 % nasal spray Commonly known as: ATROVENT Place 2 sprays into both nostrils 4 (four) times daily. Started by: Felix Pacini, DO   lisinopril 10 MG tablet Commonly known as: ZESTRIL Take 1 tablet (10 mg total) by mouth daily.   methylphenidate 54 MG CR tablet Commonly known as: CONCERTA Take 1 tablet (54 mg total) by mouth every morning.   methylphenidate 10 MG tablet Commonly known as: RITALIN 1 tab po q Afternoon as needed   naproxen 500 MG  tablet Commonly known as: NAPROSYN Take 500 mg by mouth daily.        All past medical  history, surgical history, allergies, family history, immunizations andmedications were updated in the EMR today and reviewed under the history and medication portions of their EMR.     ROS Negative, with the exception of above mentioned in HPI   Objective:  BP 133/76   Pulse 97   Temp 98 F (36.7 C) (Oral)   Ht 5' 11.75" (1.822 m)   Wt 261 lb (118.4 kg)   SpO2 98%   BMI 35.65 kg/m  Body mass index is 35.65 kg/m. Physical Exam Vitals and nursing note reviewed.  Constitutional:      General: He is not in acute distress.    Appearance: Normal appearance. He is not ill-appearing, toxic-appearing or diaphoretic.  HENT:     Head: Normocephalic and atraumatic.     Comments: Mild right ear effusion.    Right Ear: Ear canal and external ear normal. There is no impacted cerumen.     Left Ear: Tympanic membrane, ear canal and external ear normal. There is no impacted cerumen.     Nose: Rhinorrhea present. No congestion.     Mouth/Throat:     Mouth: Mucous membranes are moist.     Pharynx: No oropharyngeal exudate or posterior oropharyngeal erythema.  Eyes:     General: No scleral icterus.       Right eye: No discharge.        Left eye: No discharge.     Extraocular Movements: Extraocular movements intact.     Pupils: Pupils are equal, round, and reactive to light.  Cardiovascular:     Rate and Rhythm: Normal rate and regular rhythm.     Heart sounds: No murmur heard. Pulmonary:     Effort: Pulmonary effort is normal. No respiratory distress.     Breath sounds: Normal breath sounds. No wheezing, rhonchi or rales.  Musculoskeletal:     Cervical back: Neck supple.     Right lower leg: No edema.     Left lower leg: No edema.  Lymphadenopathy:     Cervical: No cervical adenopathy.  Skin:    General: Skin is warm and dry.     Coloration: Skin is not jaundiced or pale.     Findings: No  rash.  Neurological:     Mental Status: He is alert and oriented to person, place, and time. Mental status is at baseline.  Psychiatric:        Mood and Affect: Mood normal.        Behavior: Behavior normal.        Thought Content: Thought content normal.        Judgment: Judgment normal.     No results found. No results found. Results for orders placed or performed in visit on 12/30/21 (from the past 24 hour(s))  POC COVID-19 BinaxNow     Status: Normal   Collection Time: 12/30/21  8:29 AM  Result Value Ref Range   SARS Coronavirus 2 Ag Negative Negative    Assessment/Plan: GERALD HARDAWAY is a 66 y.o. male present for OV for  Acute cough/diarrhea/viral illness Currently patient looks well.  Suspect mild viral illness versus increased allergies as cause.  We discussed antibiotics would not work for viral or allergies. Rest, hydrate.  Encouraged him to start OTC flonase, mucinex (DM if cough), nettie pot or nasal saline.  Z-pack prescribed if needed only.  Patient was provided with instructions to only start medication if symptoms are worsening by the end of the week. Hycodan cough syrup prescribed Atrovent  nasal spray prescribed - POC COVID-19 BinaxNow-negative - methylPREDNISolone acetate (DEPO-MEDROL) injection 80 mg F/U 2 weeks if not improved.    Reviewed expectations re: course of current medical issues. Discussed self-management of symptoms. Outlined signs and symptoms indicating need for more acute intervention. Patient verbalized understanding and all questions were answered. Patient received an After-Visit Summary.    Orders Placed This Encounter  Procedures   POC COVID-19 BinaxNow   Meds ordered this encounter  Medications   ipratropium (ATROVENT) 0.06 % nasal spray    Sig: Place 2 sprays into both nostrils 4 (four) times daily.    Dispense:  15 mL    Refill:  1   azithromycin (ZITHROMAX) 250 MG tablet    Sig: Take 2 tablets on day 1, then 1 tablet  daily on days 2 through 5    Dispense:  6 tablet    Refill:  0   HYDROcodone bit-homatropine (HYCODAN) 5-1.5 MG/5ML syrup    Sig: Take 5 mLs by mouth every 8 (eight) hours as needed for cough.    Dispense:  120 mL    Refill:  0   methylPREDNISolone acetate (DEPO-MEDROL) injection 80 mg   Referral Orders  No referral(s) requested today   31 minutes spent providing patient counseling and care.    Note is dictated utilizing voice recognition software. Although note has been proof read prior to signing, occasional typographical errors still can be missed. If any questions arise, please do not hesitate to call for verification.   electronically signed by:  Felix Pacini, DO  [facility] - [place]

## 2022-01-13 ENCOUNTER — Other Ambulatory Visit: Payer: Self-pay | Admitting: Family Medicine

## 2022-01-13 NOTE — Telephone Encounter (Signed)
Requesting: methylphenidate Contract: 05/16/21 UDS: 10/11/18 Last Visit: 07/25/21 Next Visit: 01/23/22 Last Refill: 10/20/21(90,0)  Please Advise. Med pending

## 2022-01-14 MED ORDER — METHYLPHENIDATE HCL ER (OSM) 54 MG PO TBCR: 54.0000 mg | EXTENDED_RELEASE_TABLET | ORAL | 0 refills | Status: DC

## 2022-01-23 ENCOUNTER — Ambulatory Visit: Payer: BC Managed Care – PPO | Admitting: Family Medicine

## 2022-01-23 NOTE — Progress Notes (Deleted)
Office Note 01/23/2022  CC: No chief complaint on file.  Patient is a 66 y.o. male who is here for 44mof/u HTN and adult ADD. A/P as of last visit: "#1 hypertension, well controlled.  Continue lisinopril 10 mg a day.  Electrolytes and creatinine checked today.  Try to monitor blood pressure at home periodically.  2.  Adult ADD: Stable.  No change in medications today. No new medication prescriptions today. Controlled substance contract up-to-date.   #3 elevated PSA.  Evaluation has been reassuring that this is benign.  He has follow-up with his urologist early next year."  INTERIM HX: ***    PMP AWARE reviewed today: most recent rx for methyphenidate 10 was filled 11/26/21, # 945 rx by me. Most recent prescription for methylphen 54 CR was filled 10/24/2021, #90 prescription by me No red flags.    Past Medical History:  Diagnosis Date   Acne    Dr. STamala Julian(was on accutane at one point)   Adult ADHD 11/2017   Borderline hyperlipidemia    BPH with obstruction/lower urinary tract symptoms 2015   finasteride trial 09/2015. Tamsulosin added 05/2019 urol->Surgical options discussed at that time.   Condyloma acuminata 07/2019   responded well to imiquimod   COVID 08/2019   high fever lethargic weakness x 5 days all symptoms resolved   Depression    Zoloft 2005   Diverticulosis 08/2019   Noted on screening colonoscopy   Elevated PSA 2015   Bx benign 12/2014.  04/2016 prostate MRI negative. PSA stable at 4.29 as of 02/2018.  PSA doubled 08/2018-->13.2 02/2019->rpt bx benign. PSAs followed by uBurman Fre[facility]   Episcleritis of right eye    w/mild scleritis right eye ([facility] opht 2011); also with hx of ocular varicella at age 66   GERD (gastroesophageal reflux disease)    Tums   Gross hematuria summer 2021   urol->w/u-> Bladder stones. pt to get cystolithopaxy as of 09/27/20   History of kidney stones 25 yrs ago   Hypertension    Hypogonadism male 2009   Nephrolithiasis    Osteoarthritis of left  knee    Dr. RMayer Camel  Skin abnormality 10/28/2020   right thigh ingrown hair on doxycycline healing well per pt   Wears glasses     Past Surgical History:  Procedure Laterality Date   COLONOSCOPY  09/22/2019   X 2, normal (for strong FH of colon cancer.  Last in approx 2010 was normal.  Rpt 08/2019 NO POLYPS->repeat 10 yrs   CYSTOSCOPY WITH INSERTION OF UROLIFT  11/01/2020   Procedure: CYSTOSCOPY WITH INSERTION OF UROLIFT;  Surgeon: EFestus Aloe MD;  [facility]  Service: Urology;;   CYSTOSCOPY WITH LITHOLAPAXY N/A 11/01/2020   Procedure: CYSTOSCOPY WITH LITHOLAPAXY;  Surgeon: EFestus Aloe MD;  [facility]  Service: Urology;  Laterality: N/A;   FRACTURE SURGERY Left 1974   wrist    KNEE ARTHROSCOPY     Left: 1990s and early 2000's.   LITHOTRIPSY     1990s   PROSTATE BIOPSY  12/2014; 02/2019   2016 benign.  02/2019 benign.   ROTATOR CUFF REPAIR Right 2004   Right   THROAT SURGERY     Infected branchial cleft cyst 2007   TOTAL KNEE ARTHROPLASTY Left 03/10/2016   Procedure: TOTAL KNEE ARTHROPLASTY;  Surgeon: PMelrose Nakayama MD;  [facility]  Service: Orthopedics;  Laterality: Left;    Family History  Problem Relation Age of Onset   Cancer Paternal  Grandfather        either prostate, colon or something in that area - not definitive dx of cancer    Multiple myeloma Brother    Colon cancer Neg Hx    Colon polyps Neg Hx    Esophageal cancer Neg Hx    Rectal cancer Neg Hx    Stomach cancer Neg Hx     Social History   Socioeconomic History   Marital status: Married    Spouse name: Not on file   Number of children: Not on file   Years of education: Not on file   Highest education level: Not on file  Occupational History   Not on file  Tobacco Use   Smoking status: Never   Smokeless tobacco: Never  Vaping Use   Vaping Use: Never used  Substance and Sexual Activity   Alcohol use: Yes    Alcohol/week: 3.0  standard drinks    Types: 3 Glasses of wine per week    Comment: few week   Drug use: No   Sexual activity: Not on file  Other Topics Concern   Not on file  Social History Narrative   Married, 2 teenage children.   Relocated to [place] from [place] 2007.   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   No tobacco.  Drinks 2-3 glasses of wine per week.  No drugs.   Walks his dog about 1 mile per day.   Social Determinants of Health   Financial Resource Strain: Not on file  Food Insecurity: Not on file  Transportation Needs: Not on file  Physical Activity: Not on file  Stress: Not on file  Social Connections: Not on file  Intimate Partner Violence: Not on file    Outpatient Medications Prior to Visit  Medication Sig Dispense Refill   cyclobenzaprine (FLEXERIL) 10 MG tablet SMARTSIG:0.5-1 Tablet(s) By Mouth 2-3 Times Daily PRN     DENTA 5000 PLUS 1.1 % CREA dental cream See admin instructions.     HYDROcodone bit-homatropine (HYCODAN) 5-1.5 MG/5ML syrup Take 5 mLs by mouth every 8 (eight) hours as needed for cough. 120 mL 0   ipratropium (ATROVENT) 0.06 % nasal spray Place 2 sprays into both nostrils 4 (four) times daily. 15 mL 1   lisinopril (ZESTRIL) 10 MG tablet Take 1 tablet (10 mg total) by mouth daily. 90 tablet 3   methylphenidate (RITALIN) 10 MG tablet 1 tab po q Afternoon as needed 90 tablet 0   methylphenidate 54 MG PO CR tablet Take 1 tablet (54 mg total) by mouth every morning. 90 tablet 0   naproxen (NAPROSYN) 500 MG tablet Take 500 mg by mouth daily.     No facility-administered medications prior to visit.    Allergies  Allergen Reactions   Amlodipine Other (See Comments)    disequilibrium   Sulfa Antibiotics Rash    ROS *** PE;    12/30/2021    8:05 AM 07/25/2021    8:45 AM 05/16/2021    8:12 AM  Vitals with BMI  Height 5' 11.75" 5' 11.75" 5' 11.75"  Weight 261 lbs 260 lbs 3 oz 256 lbs 3 oz  BMI 35.66  83.41 96.22  Systolic 297 989 211  Diastolic 76 77 89  Pulse 97 76 71     *** Pertinent labs:  Lab Results  Component Value Date   TSH 3.37 05/16/2021   Lab Results  Component Value Date   WBC  6.4 05/16/2021   HGB 14.8 05/16/2021   HCT 44.4 05/16/2021   MCV 88.4 05/16/2021   PLT 257.0 05/16/2021   Lab Results  Component Value Date   CREATININE 0.94 07/25/2021   BUN 20 07/25/2021   NA 138 07/25/2021   K 4.4 07/25/2021   CL 104 07/25/2021   CO2 27 07/25/2021   Lab Results  Component Value Date   ALT 28 05/16/2021   AST 23 05/16/2021   ALKPHOS 72 05/16/2021   BILITOT 0.5 05/16/2021   Lab Results  Component Value Date   CHOL 172 05/16/2021   Lab Results  Component Value Date   HDL 52.80 05/16/2021   Lab Results  Component Value Date   LDLCALC 103 (H) 05/16/2021   Lab Results  Component Value Date   TRIG 81.0 05/16/2021   Lab Results  Component Value Date   CHOLHDL 3 05/16/2021   Lab Results  Component Value Date   PSA 11.70 10/24/2021   PSA 10.25 (H) 05/16/2021   PSA 8.32 09/27/2020   PSA 8.32 09/27/2020   ASSESSMENT AND PLAN:   No problem-specific Assessment & Plan notes found for this encounter.   An After Visit Summary was printed and given to the patient.  FOLLOW UP:  No follow-ups on file. Cpe 04/2022  Signed:  Crissie Sickles, MD           01/23/2022

## 2022-01-29 ENCOUNTER — Encounter: Payer: Self-pay | Admitting: Family Medicine

## 2022-01-29 ENCOUNTER — Ambulatory Visit (INDEPENDENT_AMBULATORY_CARE_PROVIDER_SITE_OTHER): Payer: BC Managed Care – PPO | Admitting: Family Medicine

## 2022-01-29 VITALS — BP 131/77 | HR 77 | Temp 98.0°F | Ht 71.75 in | Wt 263.2 lb

## 2022-01-29 DIAGNOSIS — Z79899 Other long term (current) drug therapy: Secondary | ICD-10-CM

## 2022-01-29 DIAGNOSIS — I10 Essential (primary) hypertension: Secondary | ICD-10-CM

## 2022-01-29 DIAGNOSIS — F988 Other specified behavioral and emotional disorders with onset usually occurring in childhood and adolescence: Secondary | ICD-10-CM | POA: Diagnosis not present

## 2022-01-29 LAB — BASIC METABOLIC PANEL
BUN: 20 mg/dL (ref 6–23)
CO2: 27 mEq/L (ref 19–32)
Calcium: 9.5 mg/dL (ref 8.4–10.5)
Chloride: 104 mEq/L (ref 96–112)
Creatinine, Ser: 0.92 mg/dL (ref 0.40–1.50)
GFR: 88.12 mL/min (ref >60.00)
Glucose, Bld: 92 mg/dL (ref 70–99)
Potassium: 4.8 mEq/L (ref 3.5–5.1)
Sodium: 140 mEq/L (ref 135–145)

## 2022-01-29 MED ORDER — METHYLPHENIDATE HCL ER (OSM) 54 MG PO TBCR: 54.0000 mg | EXTENDED_RELEASE_TABLET | ORAL | 0 refills | Status: DC

## 2022-01-29 MED ORDER — METHYLPHENIDATE HCL 10 MG PO TABS: ORAL_TABLET | ORAL | 0 refills | Status: DC

## 2022-01-29 NOTE — Progress Notes (Signed)
OFFICE VISIT  01/29/2022  CC:  Chief Complaint  Patient presents with   Hypertension    Pt is fasting   ADD    Patient is a 66 y.o. male who presents for 58mo f/u HTN and adult ADD. A/P as of last visit: "#1 hypertension, well controlled.  Continue lisinopril 10 mg a day.  Electrolytes and creatinine checked today.  Try to monitor blood pressure at home periodically.  2.  Adult ADD: Stable.  No change in medications today. No new medication prescriptions today. Controlled substance contract up-to-date.   #3 elevated PSA.  Evaluation has been reassuring that this is benign.  He has follow-up with his urologist early next year."  INTERIM HX: Carlos Ellis is feeling well. Home blood pressures consistently around 130/80.  Pt states all is going well with the med at current dosing--> methylphenidate CR 54 mg every morning and Ritalin 10 mg q. afternoon: much improved focus, concentration, task completion.  Less frustration, better multitasking, less impulsivity and restlessness.  Mood is stable. No side effects from the medication.  PMP AWARE reviewed today: most recent rx for methylphenidate extended release 54 mg was filled 10/24/2021, #90, rx by me. Most recent Ritalin 10 mg prescription filled 11/26/21, #90, prescription by me No red flags.  ROS as above, plus--> no fevers, no CP, no SOB, no wheezing, no cough, no dizziness, no HAs, no rashes, no melena/hematochezia.  No polyuria or polydipsia.  No myalgias or arthralgias.  No focal weakness, paresthesias, or tremors.  No acute vision or hearing abnormalities.  No dysuria or unusual/new urinary urgency or frequency.  No recent changes in lower legs. No n/v/d or abd pain.  No palpitations.    Past Medical History:  Diagnosis Date   Acne    Dr. Katrinka Blazing (was on accutane at one point)   Adult ADHD 11/2017   Borderline hyperlipidemia    BPH with obstruction/lower urinary tract symptoms 2015   finasteride trial 09/2015. Tamsulosin added 05/2019  urol->Surgical options discussed at that time.   Condyloma acuminata 07/2019   responded well to imiquimod   COVID 08/2019   high fever lethargic weakness x 5 days all symptoms resolved   Depression    Zoloft 2005   Diverticulosis 08/2019   Noted on screening colonoscopy   Elevated PSA 2015   Bx benign 12/2014.  04/2016 prostate MRI negative. PSA stable at 4.29 as of 02/2018.  PSA doubled 08/2018-->13.2 02/2019->rpt bx benign. PSAs followed by Janetta Hora.   Episcleritis of right eye    w/mild scleritis right eye ([facility] opht 2011); also with hx of ocular varicella at age 44.   GERD (gastroesophageal reflux disease)    Tums   Gross hematuria summer 2021   urol->w/u-> Bladder stones. pt to get cystolithopaxy as of 09/27/20   History of kidney stones 25 yrs ago   Hypertension    Hypogonadism male 2009   Nephrolithiasis    Osteoarthritis of left knee    Dr. Turner Daniels   Skin abnormality 10/28/2020   right thigh ingrown hair on doxycycline healing well per pt   Wears glasses     Past Surgical History:  Procedure Laterality Date   COLONOSCOPY  09/22/2019   X 2, normal (for strong FH of colon cancer.  Last in approx 2010 was normal.  Rpt 08/2019 NO POLYPS->repeat 10 yrs   CYSTOSCOPY WITH INSERTION OF UROLIFT  11/01/2020   Procedure: CYSTOSCOPY WITH INSERTION OF UROLIFT;  Surgeon: [name], MD;  Location: [facility];  Service: Urology;;   CYSTOSCOPY WITH LITHOLAPAXY N/A 11/01/2020   Procedure: CYSTOSCOPY WITH LITHOLAPAXY;  Surgeon: [name], MD;  Location: [facility];  Service: Urology;  Laterality: N/A;   FRACTURE SURGERY Left 1974   wrist    KNEE ARTHROSCOPY     Left: 1990s and early 2000's.   LITHOTRIPSY     1990s   PROSTATE BIOPSY  12/2014; 02/2019   2016 benign.  02/2019 benign.   ROTATOR CUFF REPAIR Right 2004   Right   THROAT SURGERY     Infected branchial cleft cyst 2007   TOTAL KNEE ARTHROPLASTY Left 03/10/2016   Procedure: TOTAL KNEE  ARTHROPLASTY;  Surgeon: Marcene Corning, MD;  Location: [facility];  Service: Orthopedics;  Laterality: Left;    Outpatient Medications Prior to Visit  Medication Sig Dispense Refill   lisinopril (ZESTRIL) 10 MG tablet Take 1 tablet (10 mg total) by mouth daily. 90 tablet 3   naproxen (NAPROSYN) 500 MG tablet Take 500 mg by mouth daily.     DENTA 5000 PLUS 1.1 % CREA dental cream See admin instructions. (Patient not taking: Reported on 01/29/2022)     cyclobenzaprine (FLEXERIL) 10 MG tablet SMARTSIG:0.5-1 Tablet(s) By Mouth 2-3 Times Daily PRN (Patient not taking: Reported on 01/29/2022)     HYDROcodone bit-homatropine (HYCODAN) 5-1.5 MG/5ML syrup Take 5 mLs by mouth every 8 (eight) hours as needed for cough. (Patient not taking: Reported on 01/29/2022) 120 mL 0   ipratropium (ATROVENT) 0.06 % nasal spray Place 2 sprays into both nostrils 4 (four) times daily. (Patient not taking: Reported on 01/29/2022) 15 mL 1   methylphenidate (RITALIN) 10 MG tablet 1 tab po q Afternoon as needed 90 tablet 0   methylphenidate 54 MG PO CR tablet Take 1 tablet (54 mg total) by mouth every morning. 90 tablet 0   No facility-administered medications prior to visit.    Allergies  Allergen Reactions   Amlodipine Other (See Comments)    disequilibrium   Sulfa Antibiotics Rash    ROS As per HPI  PE:    01/29/2022    8:22 AM 12/30/2021    8:05 AM 07/25/2021    8:45 AM  Vitals with BMI  Height 5' 11.75" 5' 11.75" 5' 11.75"  Weight 263 lbs 3 oz 261 lbs 260 lbs 3 oz  BMI 35.96 35.66 35.55  Systolic 131 133 654  Diastolic 77 76 77  Pulse 77 97 76   Physical Exam  Wt Readings from Last 2 Encounters:  01/29/22 263 lb 3.2 oz (119.4 kg)  12/30/21 261 lb (118.4 kg)    Gen: alert, oriented x 4, affect pleasant.  Lucid thinking and conversation noted. HEENT: PERRLA, EOMI.   Neck: no LAD, mass, or thyromegaly. CV: RRR, no m/r/g LUNGS: CTA bilat, nonlabored. NEURO: no tremor or tics noted on observation.  Coordination  intact. CN 2-12 grossly intact bilaterally, strength 5/5 in all extremeties.  No ataxia.   LABS:  Last CBC Lab Results  Component Value Date   WBC 6.4 05/16/2021   HGB 14.8 05/16/2021   HCT 44.4 05/16/2021   MCV 88.4 05/16/2021   MCH 29.7 02/28/2016   RDW 14.8 05/16/2021   PLT 257.0 05/16/2021   Last metabolic panel Lab Results  Component Value Date   GLUCOSE 91 07/25/2021   NA 138 07/25/2021   K 4.4 07/25/2021   CL 104 07/25/2021   CO2 27 07/25/2021   BUN 20 07/25/2021   CREATININE 0.94 07/25/2021   GFRNONAA >  60 02/28/2016   CALCIUM 9.3 07/25/2021   PROT 6.8 05/16/2021   ALBUMIN 4.5 05/16/2021   BILITOT 0.5 05/16/2021   ALKPHOS 72 05/16/2021   AST 23 05/16/2021   ALT 28 05/16/2021   ANIONGAP 8 02/28/2016   Last lipids Lab Results  Component Value Date   CHOL 172 05/16/2021   HDL 52.80 05/16/2021   LDLCALC 103 (H) 05/16/2021   TRIG 81.0 05/16/2021   CHOLHDL 3 05/16/2021   Last thyroid functions Lab Results  Component Value Date   TSH 3.37 05/16/2021   IMPRESSION AND PLAN:  #1 hypertension, well controlled on lisinopril 10 mg a day. Electrolytes and creatinine today.  2.  Adult ADD.  Doing well on methylphenidate CR 54 mg every morning and methylphenidate short acting 10 mg every afternoon.  30-day supply for each of these sent today. Plan tox screen at next CPE in 3 months.  An After Visit Summary was printed and given to the patient.  FOLLOW UP: Return in about 3 months (around 05/01/2022) for annual CPE (fasting).  Signed:  Santiago BumpersPhil Flynt Breeze, MD           01/29/2022

## 2022-04-28 ENCOUNTER — Other Ambulatory Visit: Payer: Self-pay | Admitting: Family Medicine

## 2022-04-28 MED ORDER — METHYLPHENIDATE HCL ER (OSM) 54 MG PO TBCR: 54.0000 mg | EXTENDED_RELEASE_TABLET | ORAL | 0 refills | Status: DC

## 2022-05-01 LAB — PSA: PSA: 12

## 2022-05-05 ENCOUNTER — Other Ambulatory Visit (HOSPITAL_COMMUNITY)
Admission: RE | Admit: 2022-05-05 | Discharge: 2022-05-05 | Disposition: A | Discharge status: Home / Self Care | Payer: BC Managed Care – PPO | Source: Ambulatory Visit | Attending: Family Medicine | Admitting: Family Medicine

## 2022-05-05 ENCOUNTER — Encounter: Payer: Self-pay | Admitting: Family Medicine

## 2022-05-05 ENCOUNTER — Ambulatory Visit (INDEPENDENT_AMBULATORY_CARE_PROVIDER_SITE_OTHER): Payer: BC Managed Care – PPO | Admitting: Family Medicine

## 2022-05-05 VITALS — BP 123/74 | HR 69 | Temp 98.1°F | Ht 72.75 in | Wt 266.2 lb

## 2022-05-05 DIAGNOSIS — Z79899 Other long term (current) drug therapy: Secondary | ICD-10-CM | POA: Diagnosis not present

## 2022-05-05 DIAGNOSIS — Z113 Encounter for screening for infections with a predominantly sexual mode of transmission: Secondary | ICD-10-CM

## 2022-05-05 DIAGNOSIS — F988 Other specified behavioral and emotional disorders with onset usually occurring in childhood and adolescence: Secondary | ICD-10-CM

## 2022-05-05 DIAGNOSIS — Z23 Encounter for immunization: Secondary | ICD-10-CM

## 2022-05-05 DIAGNOSIS — M170 Bilateral primary osteoarthritis of knee: Secondary | ICD-10-CM

## 2022-05-05 DIAGNOSIS — Z0000 Encounter for general adult medical examination without abnormal findings: Secondary | ICD-10-CM

## 2022-05-05 DIAGNOSIS — I10 Essential (primary) hypertension: Secondary | ICD-10-CM

## 2022-05-05 LAB — COMPREHENSIVE METABOLIC PANEL
ALT: 25 U/L (ref 0–53)
AST: 23 U/L (ref 0–37)
Albumin: 4.3 g/dL (ref 3.5–5.2)
Alkaline Phosphatase: 63 U/L (ref 39–117)
BUN: 19 mg/dL (ref 6–23)
CO2: 26 mEq/L (ref 19–32)
Calcium: 9.4 mg/dL (ref 8.4–10.5)
Chloride: 105 mEq/L (ref 96–112)
Creatinine, Ser: 1.03 mg/dL (ref 0.40–1.50)
GFR: 76.81 mL/min (ref >60.00)
Glucose, Bld: 101 mg/dL — ABNORMAL HIGH (ref 70–99)
Potassium: 4.7 mEq/L (ref 3.5–5.1)
Sodium: 140 mEq/L (ref 135–145)
Total Bilirubin: 0.6 mg/dL (ref 0.2–1.2)
Total Protein: 6.7 g/dL (ref 6.0–8.3)

## 2022-05-05 LAB — CBC
HCT: 42.1 % (ref 39.0–52.0)
Hemoglobin: 14.3 g/dL (ref 13.0–17.0)
MCHC: 33.9 g/dL (ref 30.0–36.0)
MCV: 89.2 fl (ref 78.0–100.0)
Platelets: 239 10*3/uL (ref 150.0–400.0)
RBC: 4.72 Mil/uL (ref 4.22–5.81)
RDW: 13.7 % (ref 11.5–15.5)
WBC: 5.9 10*3/uL (ref 4.0–10.5)

## 2022-05-05 LAB — LIPID PANEL
Cholesterol: 161 mg/dL (ref 0–200)
HDL: 50.4 mg/dL (ref >39.00)
LDL Cholesterol: 91 mg/dL (ref 0–99)
NonHDL: 110.25
Total CHOL/HDL Ratio: 3
Triglycerides: 94 mg/dL (ref 0.0–149.0)
VLDL: 18.8 mg/dL (ref 0.0–40.0)

## 2022-05-05 MED ORDER — LISINOPRIL 10 MG PO TABS: 10.0000 mg | ORAL_TABLET | Freq: Every day | ORAL | 3 refills | Status: DC

## 2022-05-05 MED ORDER — METHYLPHENIDATE HCL 10 MG PO TABS: ORAL_TABLET | ORAL | 0 refills | Status: DC

## 2022-05-05 NOTE — Progress Notes (Signed)
Office Note 05/05/2022  CC:  Chief Complaint  Patient presents with   Annual Exam    Pt is fasting   HPI:  Patient is a 66 y.o. male who is here for annual health maintenance exam and follow-up hypertension and adult ADD. A/P as of last visit: "#1 hypertension, well controlled on lisinopril 10 mg a day. Electrolytes and creatinine today.  2.  Adult ADD.  Doing well on methylphenidate CR 54 mg every morning and methylphenidate short acting 10 mg every afternoon.  30-day supply for each of these sent today. Plan tox screen at next CPE in 3 months".  INTERIM HX: Feeling well. Taking naproxen 500 qd most days for ongoing mild bilat knee pains.  ADD: Pt states all is going well with the med at current dosing, taking concerta 54 qd and some days takes an additional 10 mg Ritalin in the afternoon: much improved focus, concentration, task completion.  Less frustration, better multitasking, less impulsivity and restlessness.  Mood is stable. No side effects from the medication.   PMP AWARE reviewed today: most recent rx for Concerta was filled 08/29/1094, #90, rx by me. Most recent prescription for methylphenidate 10 mg was filled 12/25/2021, #90, prescription by me. No red flags.  Past Medical History:  Diagnosis Date   Acne    Dr. Tamala Julian (was on accutane at one point)   Adult ADHD 11/2017   Borderline hyperlipidemia    BPH with obstruction/lower urinary tract symptoms 2015   finasteride trial 09/2015. Tamsulosin added 05/2019 urol->Surgical options discussed at that time.   Condyloma acuminata 07/2019   responded well to imiquimod   COVID 08/2019   high fever lethargic weakness x 5 days all symptoms resolved   Depression    Zoloft 2005   Diverticulosis 08/2019   Noted on screening colonoscopy   Elevated PSA 2015   Bx benign 12/2014.  04/2016 prostate MRI negative. PSA stable at 4.29 as of 02/2018.  PSA doubled 08/2018-->13.2 02/2019->rpt bx benign. PSAs followed by [facility].    Episcleritis of right eye    w/mild scleritis right eye ([facility] opht 2011); also with hx of ocular varicella at age 66.   GERD (gastroesophageal reflux disease)    Tums   Gross hematuria summer 2021   urol->w/u-> Bladder stones. pt to get cystolithopaxy as of 09/27/20   History of kidney stones 25 yrs ago   Hypertension    Hypogonadism male 2009   Nephrolithiasis    Osteoarthritis of left knee    Dr. Mayer Camel    Past Surgical History:  Procedure Laterality Date   COLONOSCOPY  09/22/2019   X 2, normal (for strong FH of colon cancer.  Last in approx 2010 was normal.  Rpt 08/2019 NO POLYPS->repeat 10 yrs   CYSTOSCOPY WITH INSERTION OF UROLIFT  11/01/2020   Procedure: CYSTOSCOPY WITH INSERTION OF UROLIFT;  Surgeon: Festus Aloe, MD;  Location: [facility];  Service: Urology;;   CYSTOSCOPY WITH LITHOLAPAXY N/A 11/01/2020   Procedure: CYSTOSCOPY WITH LITHOLAPAXY;  Surgeon: Festus Aloe, MD;  Location: [facility];  Service: Urology;  Laterality: N/A;   FRACTURE SURGERY Left 1974   wrist    KNEE ARTHROSCOPY     Left: 1990s and early 2000's.   LITHOTRIPSY     1990s   PROSTATE BIOPSY  12/2014; 02/2019   2016 benign.  02/2019 benign.   ROTATOR CUFF REPAIR Right 2004   Right   THROAT SURGERY     Infected branchial cleft cyst  2007   TOTAL KNEE ARTHROPLASTY Left 03/10/2016   Procedure: TOTAL KNEE ARTHROPLASTY;  Surgeon: Melrose Nakayama, MD;  Location: [place];  Service: Orthopedics;  Laterality: Left;    Family History  Problem Relation Age of Onset   Cancer Paternal Grandfather        either prostate, colon or something in that area - not definitive dx of cancer    Multiple myeloma Brother    Colon cancer Neg Hx    Colon polyps Neg Hx    Esophageal cancer Neg Hx    Rectal cancer Neg Hx    Stomach cancer Neg Hx     Social History   Socioeconomic History   Marital status: Married    Spouse name: Not on file   Number of children: Not on file   Years of  education: Not on file   Highest education level: Not on file  Occupational History   Not on file  Tobacco Use   Smoking status: Never   Smokeless tobacco: Never  Vaping Use   Vaping Use: Never used  Substance and Sexual Activity   Alcohol use: Yes    Alcohol/week: 3.0 standard drinks of alcohol    Types: 3 Glasses of wine per week    Comment: few week   Drug use: No   Sexual activity: Not on file  Other Topics Concern   Not on file  Social History Narrative   Married, 2 teenage children.   Relocated to [place] from [place] 2007.   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   No tobacco.  Drinks 2-3 glasses of wine per week.  No drugs.   Walks his dog about 1 mile per day.   Social Determinants of Health   Financial Resource Strain: Not on file  Food Insecurity: Not on file  Transportation Needs: Not on file  Physical Activity: Not on file  Stress: Not on file  Social Connections: Not on file  Intimate Partner Violence: Not on file    Outpatient Medications Prior to Visit  Medication Sig Dispense Refill   methylphenidate 54 MG PO CR tablet Take 1 tablet (54 mg total) by mouth every morning. 90 tablet 0   naproxen (NAPROSYN) 500 MG tablet Take 500 mg by mouth daily.     lisinopril (ZESTRIL) 10 MG tablet Take 1 tablet (10 mg total) by mouth daily. 90 tablet 3   methylphenidate (RITALIN) 10 MG tablet 1 tab po q Afternoon as needed 90 tablet 0   DENTA 5000 PLUS 1.1 % CREA dental cream See admin instructions. (Patient not taking: Reported on 01/29/2022)     No facility-administered medications prior to visit.    Allergies  Allergen Reactions   Amlodipine Other (See Comments)    disequilibrium   Sulfa Antibiotics Rash    ROS Review of Systems  Constitutional:  Negative for appetite change, chills, fatigue and fever.  HENT:  Negative for congestion, dental problem, ear pain and sore throat.   Eyes:  Negative for  discharge, redness and visual disturbance.  Respiratory:  Negative for cough, chest tightness, shortness of breath and wheezing.   Cardiovascular:  Negative for chest pain, palpitations and leg swelling.  Gastrointestinal:  Positive for rectal pain (external hemorrhoid lately, not bleeding, using otc med). Negative for abdominal pain, blood in stool, diarrhea, nausea and vomiting.       Occ GERD lately  Genitourinary:  Negative for difficulty urinating, dysuria, flank  pain, frequency, hematuria and urgency.  Musculoskeletal:  Positive for arthralgias (knees and various other joints, mild). Negative for back pain, joint swelling, myalgias and neck stiffness.  Skin:  Negative for pallor and rash.  Neurological:  Negative for dizziness, speech difficulty, weakness and headaches.  Hematological:  Negative for adenopathy. Does not bruise/bleed easily.  Psychiatric/Behavioral:  Negative for confusion and sleep disturbance. The patient is not nervous/anxious.     PE;    05/05/2022    9:01 AM 01/29/2022    8:22 AM 12/30/2021    8:05 AM  Vitals with BMI  Height 6' 0.75" 5' 11.75" 5' 11.75"  Weight 266 lbs 3 oz 263 lbs 3 oz 261 lbs  BMI 35.36 54.62 70.35  Systolic 009 381 829  Diastolic 74 77 76  Pulse 69 77 97   Gen: Alert, well appearing.  Patient is oriented to person, place, time, and situation. AFFECT: pleasant, lucid thought and speech. ENT: Ears: EACs clear, normal epithelium.  TMs with good light reflex and landmarks bilaterally.  Eyes: no injection, icteris, swelling, or exudate.  EOMI, PERRLA. Nose: no drainage or turbinate edema/swelling.  No injection or focal lesion.  Mouth: lips without lesion/swelling.  Oral mucosa pink and moist.  Dentition intact and without obvious caries or gingival swelling.  Oropharynx without erythema, exudate, or swelling.  Neck: supple/nontender.  No LAD, mass, or TM.  Carotid pulses 2+ bilaterally, without bruits. CV: RRR, no m/r/g.   LUNGS: CTA bilat,  nonlabored resps, good aeration in all lung fields. ABD: soft, NT, ND, BS normal.  No hepatospenomegaly or mass.  No bruits. EXT: no clubbing, cyanosis, or edema.  Musculoskeletal: no joint swelling, erythema, warmth, or tenderness.  ROM of all joints intact. Skin - no sores or suspicious lesions or rashes or color changes  Pertinent labs:  Lab Results  Component Value Date   TSH 3.37 05/16/2021   Lab Results  Component Value Date   WBC 6.4 05/16/2021   HGB 14.8 05/16/2021   HCT 44.4 05/16/2021   MCV 88.4 05/16/2021   PLT 257.0 05/16/2021   Lab Results  Component Value Date   CREATININE 0.92 01/29/2022   BUN 20 01/29/2022   NA 140 01/29/2022   K 4.8 01/29/2022   CL 104 01/29/2022   CO2 27 01/29/2022   Lab Results  Component Value Date   ALT 28 05/16/2021   AST 23 05/16/2021   ALKPHOS 72 05/16/2021   BILITOT 0.5 05/16/2021   Lab Results  Component Value Date   CHOL 172 05/16/2021   Lab Results  Component Value Date   HDL 52.80 05/16/2021   Lab Results  Component Value Date   LDLCALC 103 (H) 05/16/2021   Lab Results  Component Value Date   TRIG 81.0 05/16/2021   Lab Results  Component Value Date   CHOLHDL 3 05/16/2021   Lab Results  Component Value Date   PSA 11.70 10/24/2021   PSA 10.25 (H) 05/16/2021   PSA 8.32 09/27/2020   PSA 8.32 09/27/2020   ASSESSMENT AND PLAN:   #1 hypertension, well controlled on lisinopril 10 mg a day. Electrolytes and creatinine today.  2.  Adult ADD.  Doing well on Concerta 54 mg every morning and some afternoons takes an additional 10 mg of Ritalin.  He has been on this long-term. Controlled substance contract up-to-date.  Urine drug screen today.  #3 STD screening.  He requests this screening because he is getting into the dating world again.  #4 osteoarthritis  both knees. He is status post total knee replacement on the left back in 2017.  Still has a little bit of mild intermittent discomfort in that knee but also  has some known arthritis in the right knee which causes him some discomfort mainly at night.  He takes naproxen most days.  We discussed the potential long-term adverse effects of daily NSAID.  We decided to switch him over to Tylenol today.  Monitoring kidney function today.  #5 health maintenance exam: Reviewed age and gender appropriate health maintenance issues (prudent diet, regular exercise, health risks of tobacco and excessive alcohol, use of seatbelts, fire alarms in home, use of sunscreen).  Also reviewed age and gender appropriate health screening as well as vaccine recommendations. Vaccines: Flu->given today.  Otherwise UTD. Labs: cbc,cmet, lipids Prostate ca screening: History of elevated PSAs.  Followed by urology. Colon ca screening: recall 2031.  An After Visit Summary was printed and given to the patient.  FOLLOW UP:  Return in about 6 months (around 11/03/2022) for routine chronic illness f/u.  Signed:  Crissie Sickles, MD           05/05/2022

## 2022-05-05 NOTE — Patient Instructions (Signed)
Health Maintenance, Male Adopting a healthy lifestyle and getting preventive care are important in promoting health and wellness. Ask your health care provider about: The right schedule for you to have regular tests and exams. Things you can do on your own to prevent diseases and keep yourself healthy. What should I know about diet, weight, and exercise? Eat a healthy diet  Eat a diet that includes plenty of vegetables, fruits, low-fat dairy products, and lean protein. Do not eat a lot of foods that are high in solid fats, added sugars, or sodium. Maintain a healthy weight Body mass index (BMI) is a measurement that can be used to identify possible weight problems. It estimates body fat based on height and weight. Your health care provider can help determine your BMI and help you achieve or maintain a healthy weight. Get regular exercise Get regular exercise. This is one of the most important things you can do for your health. Most adults should: Exercise for at least 150 minutes each week. The exercise should increase your heart rate and make you sweat (moderate-intensity exercise). Do strengthening exercises at least twice a week. This is in addition to the moderate-intensity exercise. Spend less time sitting. Even light physical activity can be beneficial. Watch cholesterol and blood lipids Have your blood tested for lipids and cholesterol at 64 years of age, then have this test every 5 years. You may need to have your cholesterol levels checked more often if: Your lipid or cholesterol levels are high. You are older than 64 years of age. You are at high risk for heart disease. What should I know about cancer screening? Many types of cancers can be detected early and may often be prevented. Depending on your health history and family history, you may need to have cancer screening at various ages. This may include screening for: Colorectal cancer. Prostate cancer. Skin cancer. Lung  cancer. What should I know about heart disease, diabetes, and high blood pressure? Blood pressure and heart disease High blood pressure causes heart disease and increases the risk of stroke. This is more likely to develop in people who have high blood pressure readings or are overweight. Talk with your health care provider about your target blood pressure readings. Have your blood pressure checked: Every 3-5 years if you are 18-39 years of age. Every year if you are 40 years old or older. If you are between the ages of 65 and 75 and are a current or former smoker, ask your health care provider if you should have a one-time screening for abdominal aortic aneurysm (AAA). Diabetes Have regular diabetes screenings. This checks your fasting blood sugar level. Have the screening done: Once every three years after age 45 if you are at a normal weight and have a low risk for diabetes. More often and at a younger age if you are overweight or have a high risk for diabetes. What should I know about preventing infection? Hepatitis B If you have a higher risk for hepatitis B, you should be screened for this virus. Talk with your health care provider to find out if you are at risk for hepatitis B infection. Hepatitis C Blood testing is recommended for: Everyone born from 1945 through 1965. Anyone with known risk factors for hepatitis C. Sexually transmitted infections (STIs) You should be screened each year for STIs, including gonorrhea and chlamydia, if: You are sexually active and are younger than 64 years of age. You are older than 64 years of age and your   health care provider tells you that you are at risk for this type of infection. Your sexual activity has changed since you were last screened, and you are at increased risk for chlamydia or gonorrhea. Ask your health care provider if you are at risk. Ask your health care provider about whether you are at high risk for HIV. Your health care provider  may recommend a prescription medicine to help prevent HIV infection. If you choose to take medicine to prevent HIV, you should first get tested for HIV. You should then be tested every 3 months for as long as you are taking the medicine. Follow these instructions at home: Alcohol use Do not drink alcohol if your health care provider tells you not to drink. If you drink alcohol: Limit how much you have to 0-2 drinks a day. Know how much alcohol is in your drink. In the [place] one drink equals one 12 oz bottle of beer (355 mL), one 5 oz glass of wine (148 mL), or one 1 oz glass of hard liquor (44 mL). Lifestyle Do not use any products that contain nicotine or tobacco. These products include cigarettes, chewing tobacco, and vaping devices, such as e-cigarettes. If you need help quitting, ask your health care provider. Do not use street drugs. Do not share needles. Ask your health care provider for help if you need support or information about quitting drugs. General instructions Schedule regular health, dental, and eye exams. Stay current with your vaccines. Tell your health care provider if: You often feel depressed. You have ever been abused or do not feel safe at home. Summary Adopting a healthy lifestyle and getting preventive care are important in promoting health and wellness. Follow your health care provider's instructions about healthy diet, exercising, and getting tested or screened for diseases. Follow your health care provider's instructions on monitoring your cholesterol and blood pressure. This information is not intended to replace advice given to you by your health care provider. Make sure you discuss any questions you have with your health care provider. Document Revised: 12/30/2020 Document Reviewed: 12/30/2020 Elsevier Patient Education  2023 Else[employer] Inc[employer]  

## 2022-05-06 LAB — RPR: RPR Ser Ql: NONREACTIVE

## 2022-05-06 LAB — URINE CYTOLOGY ANCILLARY ONLY
Chlamydia: NEGATIVE
Comment: NEGATIVE
Comment: NORMAL
Neisseria Gonorrhea: NEGATIVE

## 2022-05-06 LAB — HIV ANTIBODY (ROUTINE TESTING W REFLEX): HIV 1&2 Ab, 4th Generation: NONREACTIVE

## 2022-05-07 LAB — DM TEMPLATE

## 2022-05-07 LAB — DRUG MONITORING PANEL 376104, URINE
Amphetamines: NEGATIVE ng/mL (ref <500)
Barbiturates: NEGATIVE ng/mL (ref <300)
Benzodiazepines: NEGATIVE ng/mL (ref <100)
Cocaine Metabolite: NEGATIVE ng/mL (ref <150)
Desmethyltramadol: NEGATIVE ng/mL (ref <100)
Opiates: NEGATIVE ng/mL (ref <100)
Oxycodone: NEGATIVE ng/mL (ref <100)
Tramadol: NEGATIVE ng/mL (ref <100)

## 2022-05-19 ENCOUNTER — Other Ambulatory Visit (HOSPITAL_COMMUNITY): Payer: Self-pay | Admitting: Urology

## 2022-05-19 ENCOUNTER — Other Ambulatory Visit: Payer: Self-pay | Admitting: Urology

## 2022-05-19 DIAGNOSIS — R972 Elevated prostate specific antigen [PSA]: Secondary | ICD-10-CM

## 2022-07-10 ENCOUNTER — Ambulatory Visit (HOSPITAL_COMMUNITY)
Admission: RE | Admit: 2022-07-10 | Discharge: 2022-07-10 | Disposition: A | Discharge status: Home / Self Care | Payer: BC Managed Care – PPO | Source: Ambulatory Visit | Attending: Urology | Admitting: Urology

## 2022-07-10 DIAGNOSIS — R972 Elevated prostate specific antigen [PSA]: Secondary | ICD-10-CM | POA: Diagnosis present

## 2022-07-10 MED ORDER — GADOBUTROL 1 MMOL/ML IV SOLN
10.0000 mL | Freq: Once | INTRAVENOUS | Status: AC | PRN
  Administered 2022-07-10: 10 mL via INTRAVENOUS

## 2022-07-21 ENCOUNTER — Encounter: Payer: Self-pay | Admitting: Family Medicine

## 2022-07-21 ENCOUNTER — Ambulatory Visit (INDEPENDENT_AMBULATORY_CARE_PROVIDER_SITE_OTHER): Payer: BC Managed Care – PPO | Admitting: Family Medicine

## 2022-07-21 VITALS — BP 132/83 | HR 79 | Temp 98.3°F | Wt 257.8 lb

## 2022-07-21 DIAGNOSIS — A09 Infectious gastroenteritis and colitis, unspecified: Secondary | ICD-10-CM

## 2022-07-21 DIAGNOSIS — E860 Dehydration: Secondary | ICD-10-CM

## 2022-07-21 MED ORDER — DICYCLOMINE HCL 20 MG PO TABS: 20.0000 mg | ORAL_TABLET | Freq: Three times a day (TID) | ORAL | 0 refills | Status: DC

## 2022-07-21 MED ORDER — AZITHROMYCIN 500 MG PO TABS: 500.0000 mg | ORAL_TABLET | Freq: Every day | ORAL | 0 refills | Status: AC

## 2022-07-21 NOTE — Patient Instructions (Addendum)
Return in about 2 weeks (around 08/04/2022), or if symptoms worsen or fail to improve.        Great to see you today.  I have refilled the medication(s) we provide.   If labs were collected, we will inform you of lab results once received either by echart message or telephone call.   - echart message- for normal results that have been seen by the patient already.   - telephone call: abnormal results or if patient has not viewed results in their echart. Dehydration, Adult Dehydration is condition in which there is not enough water or other fluids in the body. This happens when a person loses more fluids than he or she takes in. Important body parts cannot work right without the right amount of fluids. Any loss of fluids from the body can cause dehydration. Dehydration can be mild, worse, or very bad. It should be treated right away to keep it from getting very bad. What are the causes? This condition may be caused by: Conditions that cause loss of water or other fluids, such as: Watery poop (diarrhea). Vomiting. Sweating a lot. Peeing (urinating) a lot. Not drinking enough fluids, especially when you: Are ill. Are doing things that take a lot of energy to do. Other illnesses and conditions, such as fever or infection. Certain medicines, such as medicines that take extra fluid out of the body (diuretics). Lack of safe drinking water. Not being able to get enough water and food. What increases the risk? The following factors may make you more likely to develop this condition: Having a long-term (chronic) illness that has not been treated the right way, such as: Diabetes. Heart disease. Kidney disease. Being 50 years of age or older. Having a disability. Living in a place that is high above the ground or sea (high in altitude). The thinner, dried air causes more fluid loss. Doing exercises that put stress on your body for a long time. What are the signs or symptoms? Symptoms of  dehydration depend on how bad it is. Mild or worse dehydration Thirst. Dry lips or dry mouth. Feeling dizzy or light-headed, especially when you stand up from sitting. Muscle cramps. Your body making: Dark pee (urine). Pee may be the color of tea. Less pee than normal. Less tears than normal. Headache. Very bad dehydration Changes in skin. Skin may: Be cold to the touch (clammy). Be blotchy or pale. Not go back to normal right after you lightly pinch it and let it go. Little or no tears, pee, or sweat. Changes in vital signs, such as: Fast breathing. Low blood pressure. Weak pulse. Pulse that is more than 100 beats a minute when you are sitting still. Other changes, such as: Feeling very thirsty. Eyes that look hollow (sunken). Cold hands and feet. Being mixed up (confused). Being very tired (lethargic) or having trouble waking from sleep. Short-term weight loss. Loss of consciousness. How is this treated? Treatment for this condition depends on how bad it is. Treatment should start right away. Do not wait until your condition gets very bad. Very bad dehydration is an emergency. You will need to go to a hospital. Mild or worse dehydration can be treated at home. You may be asked to: Drink more fluids. Drink an oral rehydration solution (ORS). This drink helps get the right amounts of fluids and salts and minerals in the blood (electrolytes). Very bad dehydration can be treated: With fluids through an IV tube. By getting normal levels of salts and  minerals in your blood. This is often done by giving salts and minerals through a tube. The tube is passed through your nose and into your stomach. By treating the root cause. Follow these instructions at home: Oral rehydration solution If told by your doctor, drink an ORS: Make an ORS. Use instructions on the package. Start by drinking small amounts, about  cup (120 mL) every 5-10 minutes. Slowly drink more until you have had  the amount that your doctor said to have. Eating and drinking        Drink enough clear fluid to keep your pee pale yellow. If you were told to drink an ORS, finish the ORS first. Then, start slowly drinking other clear fluids. Drink fluids such as: Water. Do not drink only water. Doing that can make the salt (sodium) level in your body get too low. Water from ice chips you suck on. Fruit juice that you have added water to (diluted). Low-calorie sports drinks. Eat foods that have the right amounts of salts and minerals, such as: Bananas. Oranges. Potatoes. Tomatoes. Spinach. Do not drink alcohol. Avoid: Drinks that have a lot of sugar. These include: High-calorie sports drinks. Fruit juice that you did not add water to. Soda. Caffeine. Foods that are greasy or have a lot of fat or sugar. General instructions Take over-the-counter and prescription medicines only as told by your doctor. Do not take salt tablets. Doing that can make the salt level in your body get too high. Return to your normal activities as told by your doctor. Ask your doctor what activities are safe for you. Keep all follow-up visits as told by your doctor. This is important. Contact a doctor if: You have pain in your belly (abdomen) and the pain: Gets worse. Stays in one place. You have a rash. You have a stiff neck. You get angry or annoyed (irritable) more easily than normal. You are more tired or have a harder time waking than normal. You feel: Weak or dizzy. Very thirsty. Get help right away if you have: Any symptoms of very bad dehydration. Symptoms of vomiting, such as: You cannot eat or drink without vomiting. Your vomiting gets worse or does not go away. Your vomit has blood or green stuff in it. Symptoms that get worse with treatment. A fever. A very bad headache. Problems with peeing or pooping (having a bowel movement), such as: Watery poop that gets worse or does not go away. Blood  in your poop (stool). This may cause poop to look black and tarry. Not peeing in 6-8 hours. Peeing only a small amount of very dark pee in 6-8 hours. Trouble breathing. These symptoms may be an emergency. Do not wait to see if the symptoms will go away. Get medical help right away. Call your local emergency services (911 in the [place] Do not drive yourself to the hospital. Summary Dehydration is a condition in which there is not enough water or other fluids in the body. This happens when a person loses more fluids than he or she takes in. Treatment for this condition depends on how bad it is. Treatment should be started right away. Do not wait until your condition gets very bad. Drink enough clear fluid to keep your pee pale yellow. If you were told to drink an oral rehydration solution (ORS), finish the ORS first. Then, start slowly drinking other clear fluids. Take over-the-counter and prescription medicines only as told by your doctor. Get help right away if you have  any symptoms of very bad dehydration. This information is not intended to replace advice given to you by your health care provider. Make sure you discuss any questions you have with your health care provider. Document Revised: 12/17/2021 Document Reviewed: 03/23/2019 Elsevier Patient Education  2023 [employer].

## 2022-07-21 NOTE — Progress Notes (Signed)
Carlos Ellis , May 09, 1956, 66 y.o., male MRN: [id number] Patient Care Team    Relationship Specialty Notifications Start End  McGowen, Adrian Blackwater, MD PCP - General Family Medicine  05/18/11   Festus Aloe, MD Consulting Physician Urology  09/18/14   Orbie Hurst, MD Consulting Physician Dermatology  09/15/19   Jerene Bears, MD Consulting Physician Gastroenterology  09/27/19     Chief Complaint  Patient presents with   Diarrhea    [name] out of country and started after returning 11/02     Subjective: Pt presents for an OV with complaints of diarrhea since November 2.  He states he was in [place] for travel.  There was a group of 16-18 people on the travel experience and the majority of them ended up being ill.  He did not start to have symptoms until his way home.  He reports watery stools 3-4 times a day since early November.  He is attempting to hydrate well.  He originally had a decreased appetite, but has noticed over the last couple of days his appetite has picked up some.  He denies any blood per rectum.  He denies abdominal pain or current chills/fever.  He states his wife had also been ill.  She end up having a stool culture completed, which she states was negative.     01/29/2022    8:46 AM 12/30/2021    8:07 AM 07/25/2021    8:49 AM 10/14/2020    9:55 AM 10/11/2018   10:35 AM  Depression screen PHQ 2/9  Decreased Interest 0 0 0 0 0  Down, Depressed, Hopeless 0 0 0 0 0  PHQ - 2 Score 0 0 0 0 0  Altered sleeping   0 2 0  Tired, decreased energy   0 0 0  Change in appetite   0 0 0  Feeling bad or failure about yourself    0 0 0  Trouble concentrating   0 0 0  Moving slowly or fidgety/restless   0 0 0  Suicidal thoughts   0 0 0  PHQ-9 Score   0 2 0  Difficult doing work/chores    Not difficult at all Not difficult at all    Allergies  Allergen Reactions   Amlodipine Other (See Comments)    disequilibrium   Sulfa Antibiotics Rash   Social History   Social  History Narrative   Married, 2 teenage children.   Relocated to [place] from [place] 2007.   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   No tobacco.  Drinks 2-3 glasses of wine per week.  No drugs.   Walks his dog about 1 mile per day.   Past Medical History:  Diagnosis Date   Acne    Dr. Tamala Julian (was on accutane at one point)   Adult ADHD 11/2017   Borderline hyperlipidemia    BPH with obstruction/lower urinary tract symptoms 2015   finasteride trial 09/2015. Tamsulosin added 05/2019 urol->Surgical options discussed at that time.   Condyloma acuminata 07/2019   responded well to imiquimod   COVID 08/2019   high fever lethargic weakness x 5 days all symptoms resolved   Depression    Zoloft 2005   Diverticulosis 08/2019   Noted on screening colonoscopy   Elevated PSA 2015   Bx benign 12/2014.  04/2016 prostate MRI negative. PSA stable at 4.29 as of 02/2018.  PSA doubled 08/2018-->13.2  02/2019->rpt bx benign. PSAs followed by [facility].   Episcleritis of right eye    w/mild scleritis right eye ([facility] opht 2011); also with hx of ocular varicella at age 52.   GERD (gastroesophageal reflux disease)    Tums   Gross hematuria summer 2021   urol->w/u-> Bladder stones. pt to get cystolithopaxy as of 09/27/20   History of kidney stones 25 yrs ago   Hypertension    Hypogonadism male 2009   Nephrolithiasis    Osteoarthritis of left knee    Dr. Mayer Camel   Past Surgical History:  Procedure Laterality Date   COLONOSCOPY  09/22/2019   X 2, normal (for strong FH of colon cancer.  Last in approx 2010 was normal.  Rpt 08/2019 NO POLYPS->repeat 10 yrs   CYSTOSCOPY WITH INSERTION OF UROLIFT  11/01/2020   Procedure: CYSTOSCOPY WITH INSERTION OF UROLIFT;  Surgeon: Festus Aloe, MD;  Location: [facility];  Service: Urology;;   CYSTOSCOPY WITH LITHOLAPAXY N/A 11/01/2020   Procedure: CYSTOSCOPY WITH LITHOLAPAXY;  Surgeon: Festus Aloe, MD;   Location: [facility];  Service: Urology;  Laterality: N/A;   FRACTURE SURGERY Left 1974   wrist    KNEE ARTHROSCOPY     Left: 1990s and early 2000's.   LITHOTRIPSY     1990s   PROSTATE BIOPSY  12/2014; 02/2019   2016 benign.  02/2019 benign.   ROTATOR CUFF REPAIR Right 2004   Right   THROAT SURGERY     Infected branchial cleft cyst 2007   TOTAL KNEE ARTHROPLASTY Left 03/10/2016   Procedure: TOTAL KNEE ARTHROPLASTY;  Surgeon: Melrose Nakayama, MD;  Location: [facility];  Service: Orthopedics;  Laterality: Left;   Family History  Problem Relation Age of Onset   Cancer Paternal Grandfather        either prostate, colon or something in that area - not definitive dx of cancer    Multiple myeloma Brother    Colon cancer Neg Hx    Colon polyps Neg Hx    Esophageal cancer Neg Hx    Rectal cancer Neg Hx    Stomach cancer Neg Hx    Allergies as of 07/21/2022       Reactions   Amlodipine Other (See Comments)   disequilibrium   Sulfa Antibiotics Rash        Medication List        Accurate as of July 21, 2022  3:08 PM. If you have any questions, ask your nurse or doctor.          azithromycin 500 MG tablet Commonly known as: Zithromax Take 1 tablet (500 mg total) by mouth daily for 3 days. Started by: Howard Pouch, DO   dicyclomine 20 MG tablet Commonly known as: BENTYL Take 1 tablet (20 mg total) by mouth 4 (four) times daily -  before meals and at bedtime. Started by: Howard Pouch, DO   lisinopril 10 MG tablet Commonly known as: ZESTRIL Take 1 tablet (10 mg total) by mouth daily.   methylphenidate 54 MG CR tablet Commonly known as: CONCERTA Take 1 tablet (54 mg total) by mouth every morning.   methylphenidate 10 MG tablet Commonly known as: RITALIN 1 tab po q Afternoon as needed   naproxen 500 MG tablet Commonly known as: NAPROSYN Take 500 mg by mouth daily.        All past medical history, surgical history, allergies, family history,  immunizations andmedications were updated in the EMR today and reviewed under the  history and medication portions of their EMR.     ROS Negative, with the exception of above mentioned in HPI   Objective:  BP 132/83   Pulse 79   Temp 98.3 F (36.8 C)   Wt 257 lb 12.8 oz (116.9 kg)   SpO2 97%   BMI 34.25 kg/m  Body mass index is 34.25 kg/m. Physical Exam Vitals and nursing note reviewed.  Constitutional:      General: He is not in acute distress.    Appearance: Normal appearance. He is not ill-appearing, toxic-appearing or diaphoretic.  HENT:     Head: Normocephalic and atraumatic.     Mouth/Throat:     Mouth: Mucous membranes are dry.  Eyes:     General: No scleral icterus.       Right eye: No discharge.        Left eye: No discharge.     Extraocular Movements: Extraocular movements intact.     Pupils: Pupils are equal, round, and reactive to light.  Cardiovascular:     Rate and Rhythm: Normal rate and regular rhythm.  Abdominal:     General: There is no distension.     Palpations: Abdomen is soft.     Tenderness: There is no abdominal tenderness.     Comments: Hyperactive BS  Skin:    General: Skin is warm and dry.     Coloration: Skin is not jaundiced or pale.     Findings: No rash.  Neurological:     Mental Status: He is alert and oriented to person, place, and time. Mental status is at baseline.  Psychiatric:        Mood and Affect: Mood normal.        Behavior: Behavior normal.        Thought Content: Thought content normal.        Judgment: Judgment normal.     No results found. No results found. No results found for this or any previous visit (from the past 24 hour(s)).  Assessment/Plan: JERSON FURUKAWA is a 66 y.o. male present for OV for  Traveler's diarrhea Symptoms sound consistent with traveler's diarrhea.  He seems to be have an extended course.  We discussed treatment with antibiotics can be helpful at times. Azithromycin 500 mg daily x 3  days Bentyl before meals and before bed Follow-up in 2 weeks if symptoms or not improving, sooner if needed.  Mild dehydration Encouraged him to work on hydration with water, electrolyte replacement drinks/low in sugar.  Reviewed expectations re: course of current medical issues. Discussed self-management of symptoms. Outlined signs and symptoms indicating need for more acute intervention. Patient verbalized understanding and all questions were answered. Patient received an After-Visit Summary.    No orders of the defined types were placed in this encounter.  Meds ordered this encounter  Medications   azithromycin (ZITHROMAX) 500 MG tablet    Sig: Take 1 tablet (500 mg total) by mouth daily for 3 days.    Dispense:  3 tablet    Refill:  0   dicyclomine (BENTYL) 20 MG tablet    Sig: Take 1 tablet (20 mg total) by mouth 4 (four) times daily -  before meals and at bedtime.    Dispense:  90 tablet    Refill:  0   Referral Orders  No referral(s) requested today     Note is dictated utilizing voice recognition software. Although note has been proof read prior to signing, occasional typographical errors still  can be missed. If any questions arise, please do not hesitate to call for verification.   electronically signed by:  Howard Pouch, DO  Rule

## 2022-08-12 ENCOUNTER — Other Ambulatory Visit: Payer: Self-pay | Admitting: Family Medicine

## 2022-08-13 NOTE — Telephone Encounter (Signed)
Requesting:methylphenidate 54 MG PO CR tablet  Contract: 05/05/22  UDS:n/a  Last Visit: 01/29/22  Next Visit:11/06/22 Last Refill:05/05/22 (90,0)   Please Advise

## 2022-08-17 MED ORDER — METHYLPHENIDATE HCL ER (OSM) 54 MG PO TBCR: 54.0000 mg | EXTENDED_RELEASE_TABLET | ORAL | 0 refills | Status: DC

## 2022-10-14 ENCOUNTER — Telehealth: Payer: Self-pay | Admitting: Family Medicine

## 2022-10-14 NOTE — Telephone Encounter (Signed)
Pt called and is running low on his lisinopril. He can not order or request it online, but still uses the [facility].

## 2022-10-14 NOTE — Telephone Encounter (Signed)
Mychart message sent to patient advising refill last seen 04/2022 for 1 year supply to [facility]. They should be able to assist with refill.

## 2022-11-03 ENCOUNTER — Ambulatory Visit: Payer: BC Managed Care – PPO | Admitting: Family Medicine

## 2022-11-03 ENCOUNTER — Encounter: Payer: Self-pay | Admitting: Family Medicine

## 2022-11-06 ENCOUNTER — Ambulatory Visit (INDEPENDENT_AMBULATORY_CARE_PROVIDER_SITE_OTHER): Payer: BC Managed Care – PPO | Admitting: Family Medicine

## 2022-11-06 ENCOUNTER — Encounter: Payer: Self-pay | Admitting: Family Medicine

## 2022-11-06 ENCOUNTER — Other Ambulatory Visit (HOSPITAL_COMMUNITY)
Admission: RE | Admit: 2022-11-06 | Discharge: 2022-11-06 | Disposition: A | Discharge status: Home / Self Care | Payer: BC Managed Care – PPO | Source: Ambulatory Visit | Attending: Family Medicine | Admitting: Family Medicine

## 2022-11-06 VITALS — BP 144/88 | HR 67 | Temp 97.6°F | Ht 70.0 in | Wt 272.4 lb

## 2022-11-06 DIAGNOSIS — I10 Essential (primary) hypertension: Secondary | ICD-10-CM

## 2022-11-06 DIAGNOSIS — F988 Other specified behavioral and emotional disorders with onset usually occurring in childhood and adolescence: Secondary | ICD-10-CM

## 2022-11-06 DIAGNOSIS — Z113 Encounter for screening for infections with a predominantly sexual mode of transmission: Secondary | ICD-10-CM

## 2022-11-06 DIAGNOSIS — Z79899 Other long term (current) drug therapy: Secondary | ICD-10-CM | POA: Diagnosis not present

## 2022-11-06 LAB — BASIC METABOLIC PANEL
BUN: 17 mg/dL (ref 6–23)
CO2: 24 mEq/L (ref 19–32)
Calcium: 9.2 mg/dL (ref 8.4–10.5)
Chloride: 105 mEq/L (ref 96–112)
Creatinine, Ser: 0.99 mg/dL (ref 0.40–1.50)
GFR: 80.26 mL/min (ref >60.00)
Glucose, Bld: 91 mg/dL (ref 70–99)
Potassium: 5 mEq/L (ref 3.5–5.1)
Sodium: 140 mEq/L (ref 135–145)

## 2022-11-06 MED ORDER — METHYLPHENIDATE HCL 10 MG PO TABS: ORAL_TABLET | ORAL | 0 refills | Status: DC

## 2022-11-06 MED ORDER — METHYLPHENIDATE HCL ER (OSM) 54 MG PO TBCR: 54.0000 mg | EXTENDED_RELEASE_TABLET | ORAL | 0 refills | Status: DC

## 2022-11-06 NOTE — Progress Notes (Signed)
Pt not seen- decided to stay with current pcp due to meds being prescribed

## 2022-11-06 NOTE — Progress Notes (Signed)
OFFICE VISIT  11/06/2022  CC:  Chief Complaint  Patient presents with   Medical Management of Chronic Issues    Patient is a 67 y.o. male who presents for 24-month follow-up hypertension and adult ADD.  INTERIM HX: All labs normal last visit.  Willing well. Not exercising much, has gained some weight.  No home blood pressure monitoring.  Pt states all is going well with the med at current dosing (concerta 54mg  qd, ritalin 10mg  qd): much improved focus, concentration, task completion.  Less frustration, better multitasking, less impulsivity and restlessness.  Mood is stable. No side effects from the medication.  ROS as above, plus--> no fevers, no CP, no SOB, no wheezing, no cough, no dizziness, no HAs, no rashes, no melena/hematochezia.  No polyuria or polydipsia.  No myalgias or arthralgias.  No focal weakness, paresthesias, or tremors.  No acute vision or hearing abnormalities.  No dysuria or unusual/new urinary urgency or frequency.  No recent changes in lower legs. No n/v/d or abd pain.  No palpitations.     Past Medical History:  Diagnosis Date   Acne    Dr. Tamala Julian (was on accutane at one point)   Adult ADHD 11/2017   Borderline hyperlipidemia    BPH with obstruction/lower urinary tract symptoms 2015   finasteride trial 09/2015. Tamsulosin added 05/2019 urol->Surgical options discussed at that time.   Condyloma acuminata 07/2019   responded well to imiquimod   COVID 08/2019   high fever lethargic weakness x 5 days all symptoms resolved   Depression    Zoloft 2005   Diverticulosis 08/2019   Noted on screening colonoscopy   Elevated PSA 2015   Bx benign 12/2014.  04/2016 prostate MRI negative. PSA stable at 4.29 as of 02/2018.  PSA doubled 08/2018-->13.2 02/2019->rpt bx benign. PSAs followed by [facility].   Episcleritis of right eye    w/mild scleritis right eye ([facility] opht 2011); also with hx of ocular varicella at age 25.   GERD (gastroesophageal reflux disease)    Tums   Gross  hematuria summer 2021   urol->w/u-> Bladder stones. pt to get cystolithopaxy as of 09/27/20   History of kidney stones 25 yrs ago   Hypertension    Hypogonadism male 2009   Nephrolithiasis    Osteoarthritis of left knee    Dr. Mayer Camel    Past Surgical History:  Procedure Laterality Date   COLONOSCOPY  09/22/2019   X 2, normal (for strong FH of colon cancer.  Last in approx 2010 was normal.  Rpt 08/2019 NO POLYPS->repeat 10 yrs   CYSTOSCOPY WITH INSERTION OF UROLIFT  11/01/2020   Procedure: CYSTOSCOPY WITH INSERTION OF UROLIFT;  Surgeon: Festus Aloe, MD;  Location: [facility];  Service: Urology;;   CYSTOSCOPY WITH LITHOLAPAXY N/A 11/01/2020   Procedure: CYSTOSCOPY WITH LITHOLAPAXY;  Surgeon: Festus Aloe, MD;  Location: [facility];  Service: Urology;  Laterality: N/A;   FRACTURE SURGERY Left 1974   wrist    KNEE ARTHROSCOPY     Left: 1990s and early 2000's.   LITHOTRIPSY     1990s   PROSTATE BIOPSY  12/2014; 02/2019   2016 benign.  02/2019 benign.   ROTATOR CUFF REPAIR Right 2004   Right   THROAT SURGERY     Infected branchial cleft cyst 2007   TOTAL KNEE ARTHROPLASTY Left 03/10/2016   Procedure: TOTAL KNEE ARTHROPLASTY;  Surgeon: Melrose Nakayama, MD;  Location: [facility];  Service: Orthopedics;  Laterality: Left;    Outpatient  Medications Prior to Visit  Medication Sig Dispense Refill   lisinopril (ZESTRIL) 10 MG tablet Take 1 tablet (10 mg total) by mouth daily. 90 tablet 3   naproxen (NAPROSYN) 500 MG tablet Take 500 mg by mouth daily.     methylphenidate (RITALIN) 10 MG tablet 1 tab po q Afternoon as needed 90 tablet 0   methylphenidate 54 MG PO CR tablet Take 1 tablet (54 mg total) by mouth every morning. 90 tablet 0   No facility-administered medications prior to visit.    Allergies  Allergen Reactions   Amlodipine Other (See Comments)    disequilibrium   Sulfa Antibiotics Rash    Review of Systems As per HPI  PE:    11/06/2022     8:29 AM 11/06/2022    8:11 AM 11/03/2022    2:03 PM  Vitals with BMI  Height  5\' 10"  5\' 10"   Weight  272 lbs 6 oz 272 lbs  BMI  123XX123 [id number]  Systolic 123456 [id number]   Diastolic 88 88   Pulse  67    Initial bp today 155/88 Rpt: 144/88 Physical Exam  Gen: Alert, well appearing.  Patient is oriented to person, place, time, and situation. CV: RRR, no m/r/g.   LUNGS: CTA bilat, nonlabored resps, good aeration in all lung fields. EXT: no clubbing or cyanosis.  no edema.    LABS:  Last CBC Lab Results  Component Value Date   WBC 5.9 05/05/2022   HGB 14.3 05/05/2022   HCT 42.1 05/05/2022   MCV 89.2 05/05/2022   MCH 29.7 02/28/2016   RDW 13.7 05/05/2022   PLT 239.0 99991111   Last metabolic panel Lab Results  Component Value Date   GLUCOSE 101 (H) 05/05/2022   NA 140 05/05/2022   K 4.7 05/05/2022   CL 105 05/05/2022   CO2 26 05/05/2022   BUN 19 05/05/2022   CREATININE 1.03 05/05/2022   GFRNONAA >60 02/28/2016   CALCIUM 9.4 05/05/2022   PROT 6.7 05/05/2022   ALBUMIN 4.3 05/05/2022   BILITOT 0.6 05/05/2022   ALKPHOS 63 05/05/2022   AST 23 05/05/2022   ALT 25 05/05/2022   ANIONGAP 8 02/28/2016   Last lipids Lab Results  Component Value Date   CHOL 161 05/05/2022   HDL 50.40 05/05/2022   LDLCALC 91 05/05/2022   TRIG 94.0 05/05/2022   CHOLHDL 3 05/05/2022    Last thyroid functions Lab Results  Component Value Date   TSH 3.37 05/16/2021     IMPRESSION AND PLAN:  #1 adult ADD. Doing well long-term on Concerta 54 mg every morning and Ritalin 10 mg q. afternoon. Medications refilled today.  Controlled substance contract and UDS are up-to-date.  2.  Hypertension, historically well-controlled but blood pressure appears today. No medication changes today.  He will get his home blood pressure cuff out and start checking this again to see the trend. He will call or return if consistently greater than 135/85. Basic metabolic panel today.  #3 STD screening: Patient  request this today. RPR, HIV, GC chlamydia screening ordered.   An After Visit Summary was printed and given to the patient.  FOLLOW UP: Return in about 6 months (around 05/09/2023) for annual CPE (fasting).  Signed:  Crissie Sickles, MD           11/06/2022

## 2022-11-09 LAB — URINE CYTOLOGY ANCILLARY ONLY
Chlamydia: NEGATIVE
Comment: NEGATIVE
Comment: NORMAL
Neisseria Gonorrhea: NEGATIVE

## 2022-11-09 LAB — HIV ANTIBODY (ROUTINE TESTING W REFLEX): HIV 1&2 Ab, 4th Generation: NONREACTIVE

## 2022-11-09 LAB — RPR: RPR Ser Ql: NONREACTIVE

## 2022-11-16 ENCOUNTER — Telehealth: Payer: Self-pay | Admitting: Family Medicine

## 2022-11-16 MED ORDER — METHYLPHENIDATE HCL ER (OSM) 54 MG PO TBCR: 54.0000 mg | EXTENDED_RELEASE_TABLET | ORAL | 0 refills | Status: DC

## 2022-11-16 MED ORDER — METHYLPHENIDATE HCL 10 MG PO TABS: ORAL_TABLET | ORAL | 0 refills | Status: DC

## 2022-11-16 NOTE — Telephone Encounter (Signed)
Ok rx's sent

## 2022-11-16 NOTE — Telephone Encounter (Signed)
Pt is needing refill on Methylphenidate both 10 mg and 54 mg.   He reports that [facility] has informed him a PA is needed. I informed  Carlos Ellis that the pharmacy should fax [place] something over in regards to a PA. He says they only provided him with a phone number which is [phone]

## 2022-11-16 NOTE — Telephone Encounter (Signed)
He is requesting a temp supply be called into his local pharmacy which is [facility] in [place] on [address]

## 2022-11-16 NOTE — Telephone Encounter (Signed)
Pt is requesting temporary refill on Methylphenidate 10mg  and 54mg   sent [facility] [place]

## 2022-11-23 ENCOUNTER — Encounter: Payer: Self-pay | Admitting: Family Medicine

## 2022-11-23 NOTE — Telephone Encounter (Signed)
Appeal sent for PA

## 2022-11-25 ENCOUNTER — Telehealth: Payer: Self-pay

## 2022-11-25 NOTE — Telephone Encounter (Signed)
Error

## 2022-11-27 LAB — PSA: PSA: 17.1

## 2023-01-01 ENCOUNTER — Telehealth: Payer: Self-pay

## 2023-01-01 NOTE — Telephone Encounter (Signed)
Carlos Ellis (Key: [id number]) Rx #: [id number] Need Help? Call us at [phone]  Additional Information Required An active PA is already on file with expiration date of 11/26/2023. Please wait to resubmit request within 60 days of that expiration date to obtain a PA renewal.

## 2023-03-19 LAB — PSA
PSA: 13.3
PSA: 13.3

## 2023-03-25 ENCOUNTER — Other Ambulatory Visit: Payer: Self-pay | Admitting: Family Medicine

## 2023-03-25 MED ORDER — METHYLPHENIDATE HCL ER (OSM) 54 MG PO TBCR: 54.0000 mg | EXTENDED_RELEASE_TABLET | ORAL | 0 refills | Status: DC

## 2023-03-25 NOTE — Telephone Encounter (Signed)
[facility]ethphendate refill requested and [facility] [place]. Next OV 9/23

## 2023-03-29 ENCOUNTER — Other Ambulatory Visit: Payer: Self-pay | Admitting: Urology

## 2023-04-08 ENCOUNTER — Encounter (INDEPENDENT_AMBULATORY_CARE_PROVIDER_SITE_OTHER): Payer: Self-pay

## 2023-05-11 ENCOUNTER — Encounter (HOSPITAL_BASED_OUTPATIENT_CLINIC_OR_DEPARTMENT_OTHER): Payer: Self-pay | Admitting: Urology

## 2023-05-11 ENCOUNTER — Other Ambulatory Visit: Payer: Self-pay

## 2023-05-11 NOTE — Progress Notes (Signed)
Spoke w/ via phone for pre-op interview---pt Lab needs dos----  I stat, ekg       Lab results------ COVID test -----patient states asymptomatic no test needed Arrive at -------530 05-18-2023 NPO after MN NO Solid Food.  Clear liquids from MN until---430 Med rec completed Medications to take morning of surgery -----finasteride, omeprazole Diabetic medication -----n/a Patient instructed no nail polish to be worn day of surgery Patient instructed to bring photo id and insurance card day of surgery Patient aware to have Driver (ride ) / caregiver   wife Carlos Ellis  for 24 hours after surgery -  Patient Special Instructions -----none Pre-Op special Instructions -----none Patient verbalized understanding of instructions that were given at this phone interview. Patient denies chest pain, sob, fever, cough at the interview.

## 2023-05-14 NOTE — Patient Instructions (Incomplete)
Health Maintenance, Male Adopting a healthy lifestyle and getting preventive care are important in promoting health and wellness. Ask your health care provider about: The right schedule for you to have regular tests and exams. Things you can do on your own to prevent diseases and keep yourself healthy. What should I know about diet, weight, and exercise? Eat a healthy diet  Eat a diet that includes plenty of vegetables, fruits, low-fat dairy products, and lean protein. Do not eat a lot of foods that are high in solid fats, added sugars, or sodium. Maintain a healthy weight Body mass index (BMI) is a measurement that can be used to identify possible weight problems. It estimates body fat based on height and weight. Your health care provider can help determine your BMI and help you achieve or maintain a healthy weight. Get regular exercise Get regular exercise. This is one of the most important things you can do for your health. Most adults should: Exercise for at least 150 minutes each week. The exercise should increase your heart rate and make you sweat (moderate-intensity exercise). Do strengthening exercises at least twice a week. This is in addition to the moderate-intensity exercise. Spend less time sitting. Even light physical activity can be beneficial. Watch cholesterol and blood lipids Have your blood tested for lipids and cholesterol at 65 years of age, then have this test every 5 years. You may need to have your cholesterol levels checked more often if: Your lipid or cholesterol levels are high. You are older than 65 years of age. You are at high risk for heart disease. What should I know about cancer screening? Many types of cancers can be detected early and may often be prevented. Depending on your health history and family history, you may need to have cancer screening at various ages. This may include screening for: Colorectal cancer. Prostate cancer. Skin cancer. Lung  cancer. What should I know about heart disease, diabetes, and high blood pressure? Blood pressure and heart disease High blood pressure causes heart disease and increases the risk of stroke. This is more likely to develop in people who have high blood pressure readings or are overweight. Talk with your health care provider about your target blood pressure readings. Have your blood pressure checked: Every 3-5 years if you are 18-39 years of age. Every year if you are 40 years old or older. If you are between the ages of 65 and 75 and are a current or former smoker, ask your health care provider if you should have a one-time screening for abdominal aortic aneurysm (AAA). Diabetes Have regular diabetes screenings. This checks your fasting blood sugar level. Have the screening done: Once every three years after age 45 if you are at a normal weight and have a low risk for diabetes. More often and at a younger age if you are overweight or have a high risk for diabetes. What should I know about preventing infection? Hepatitis B If you have a higher risk for hepatitis B, you should be screened for this virus. Talk with your health care provider to find out if you are at risk for hepatitis B infection. Hepatitis C Blood testing is recommended for: Everyone born from 1945 through 1965. Anyone with known risk factors for hepatitis C. Sexually transmitted infections (STIs) You should be screened each year for STIs, including gonorrhea and chlamydia, if: You are sexually active and are younger than 65 years of age. You are older than 65 years of age and your   health care provider tells you that you are at risk for this type of infection. Your sexual activity has changed since you were last screened, and you are at increased risk for chlamydia or gonorrhea. Ask your health care provider if you are at risk. Ask your health care provider about whether you are at high risk for HIV. Your health care provider  may recommend a prescription medicine to help prevent HIV infection. If you choose to take medicine to prevent HIV, you should first get tested for HIV. You should then be tested every 3 months for as long as you are taking the medicine. Follow these instructions at home: Alcohol use Do not drink alcohol if your health care provider tells you not to drink. If you drink alcohol: Limit how much you have to 0-2 drinks a day. Know how much alcohol is in your drink. In the [place] one drink equals one 12 oz bottle of beer (355 mL), one 5 oz glass of wine (148 mL), or one 1 oz glass of hard liquor (44 mL). Lifestyle Do not use any products that contain nicotine or tobacco. These products include cigarettes, chewing tobacco, and vaping devices, such as e-cigarettes. If you need help quitting, ask your health care provider. Do not use street drugs. Do not share needles. Ask your health care provider for help if you need support or information about quitting drugs. General instructions Schedule regular health, dental, and eye exams. Stay current with your vaccines. Tell your health care provider if: You often feel depressed. You have ever been abused or do not feel safe at home. Summary Adopting a healthy lifestyle and getting preventive care are important in promoting health and wellness. Follow your health care provider's instructions about healthy diet, exercising, and getting tested or screened for diseases. Follow your health care provider's instructions on monitoring your cholesterol and blood pressure. This information is not intended to replace advice given to you by your health care provider. Make sure you discuss any questions you have with your health care provider. Document Revised: 12/30/2020 Document Reviewed: 12/30/2020 Elsevier Patient Education  2024 [employer]  

## 2023-05-17 ENCOUNTER — Ambulatory Visit (INDEPENDENT_AMBULATORY_CARE_PROVIDER_SITE_OTHER): Payer: BC Managed Care – PPO | Admitting: Family Medicine

## 2023-05-17 ENCOUNTER — Encounter: Payer: Self-pay | Admitting: Family Medicine

## 2023-05-17 ENCOUNTER — Other Ambulatory Visit (HOSPITAL_COMMUNITY)
Admission: RE | Admit: 2023-05-17 | Discharge: 2023-05-17 | Disposition: A | Discharge status: Home / Self Care | Payer: BC Managed Care – PPO | Source: Ambulatory Visit | Attending: Family Medicine | Admitting: Family Medicine

## 2023-05-17 VITALS — BP 138/82 | HR 76 | Ht 73.0 in | Wt 274.8 lb

## 2023-05-17 DIAGNOSIS — G4719 Other hypersomnia: Secondary | ICD-10-CM | POA: Diagnosis not present

## 2023-05-17 DIAGNOSIS — F988 Other specified behavioral and emotional disorders with onset usually occurring in childhood and adolescence: Secondary | ICD-10-CM

## 2023-05-17 DIAGNOSIS — G4733 Obstructive sleep apnea (adult) (pediatric): Secondary | ICD-10-CM

## 2023-05-17 DIAGNOSIS — Z113 Encounter for screening for infections with a predominantly sexual mode of transmission: Secondary | ICD-10-CM | POA: Diagnosis present

## 2023-05-17 DIAGNOSIS — Z0001 Encounter for general adult medical examination with abnormal findings: Secondary | ICD-10-CM

## 2023-05-17 DIAGNOSIS — Z23 Encounter for immunization: Secondary | ICD-10-CM

## 2023-05-17 DIAGNOSIS — Z0000 Encounter for general adult medical examination without abnormal findings: Secondary | ICD-10-CM

## 2023-05-17 DIAGNOSIS — Z79899 Other long term (current) drug therapy: Secondary | ICD-10-CM | POA: Diagnosis not present

## 2023-05-17 DIAGNOSIS — I10 Essential (primary) hypertension: Secondary | ICD-10-CM | POA: Diagnosis not present

## 2023-05-17 LAB — COMPREHENSIVE METABOLIC PANEL
ALT: 30 U/L (ref 0–53)
AST: 28 U/L (ref 0–37)
Albumin: 4.7 g/dL (ref 3.5–5.2)
Alkaline Phosphatase: 74 U/L (ref 39–117)
BUN: 17 mg/dL (ref 6–23)
CO2: 26 mEq/L (ref 19–32)
Calcium: 9.7 mg/dL (ref 8.4–10.5)
Chloride: 102 mEq/L (ref 96–112)
Creatinine, Ser: 1.07 mg/dL (ref 0.40–1.50)
GFR: 72.85 mL/min (ref >60.00)
Glucose, Bld: 92 mg/dL (ref 70–99)
Potassium: 4.8 mEq/L (ref 3.5–5.1)
Sodium: 139 mEq/L (ref 135–145)
Total Bilirubin: 0.6 mg/dL (ref 0.2–1.2)
Total Protein: 7.2 g/dL (ref 6.0–8.3)

## 2023-05-17 LAB — CBC WITH DIFFERENTIAL/PLATELET
Basophils Absolute: 0.1 10*3/uL (ref 0.0–0.1)
Basophils Relative: 0.7 % (ref 0.0–3.0)
Eosinophils Absolute: 0.3 10*3/uL (ref 0.0–0.7)
Eosinophils Relative: 4 % (ref 0.0–5.0)
HCT: 46.4 % (ref 39.0–52.0)
Hemoglobin: 15.3 g/dL (ref 13.0–17.0)
Lymphocytes Relative: 20.5 % (ref 12.0–46.0)
Lymphs Abs: 1.6 10*3/uL (ref 0.7–4.0)
MCHC: 33.1 g/dL (ref 30.0–36.0)
MCV: 89.8 fl (ref 78.0–100.0)
Monocytes Absolute: 0.6 10*3/uL (ref 0.1–1.0)
Monocytes Relative: 8.3 % (ref 3.0–12.0)
Neutro Abs: 5.1 10*3/uL (ref 1.4–7.7)
Neutrophils Relative %: 66.5 % (ref 43.0–77.0)
Platelets: 271 10*3/uL (ref 150.0–400.0)
RBC: 5.16 Mil/uL (ref 4.22–5.81)
RDW: 14.2 % (ref 11.5–15.5)
WBC: 7.7 10*3/uL (ref 4.0–10.5)

## 2023-05-17 LAB — LIPID PANEL
Cholesterol: 193 mg/dL (ref 0–200)
HDL: 56.3 mg/dL (ref >39.00)
LDL Cholesterol: 112 mg/dL — ABNORMAL HIGH (ref 0–99)
NonHDL: 136.5
Total CHOL/HDL Ratio: 3
Triglycerides: 122 mg/dL (ref 0.0–149.0)
VLDL: 24.4 mg/dL (ref 0.0–40.0)

## 2023-05-17 MED ORDER — LISINOPRIL 10 MG PO TABS: 10.0000 mg | ORAL_TABLET | Freq: Every day | ORAL | 3 refills | Status: DC

## 2023-05-17 NOTE — Anesthesia Preprocedure Evaluation (Signed)
Anesthesia Evaluation  Patient identified by MRN, date of birth, ID band Patient awake    Reviewed: Allergy & Precautions, NPO status , Patient's Chart, lab work & pertinent test results  Airway Mallampati: II  TM Distance: >3 FB Neck ROM: Full    Dental no notable dental hx. (+) Teeth Intact, Dental Advisory Given   Pulmonary neg pulmonary ROS   Pulmonary exam normal breath sounds clear to auscultation       Cardiovascular hypertension, Pt. on medications Normal cardiovascular exam Rhythm:Regular Rate:Normal     Neuro/Psych  PSYCHIATRIC DISORDERS  Depression    negative neurological ROS     GI/Hepatic Neg liver ROS,GERD  Controlled,,  Endo/Other  negative endocrine ROS    Renal/GU negative Renal ROS  negative genitourinary   Musculoskeletal  (+) Arthritis ,    Abdominal   Peds  (+) ADHD Hematology negative hematology ROS (+)   Anesthesia Other Findings   Reproductive/Obstetrics                             Anesthesia Physical Anesthesia Plan  ASA: 2  Anesthesia Plan: General   Post-op Pain Management: Tylenol PO (pre-op)*   Induction: Intravenous  PONV Risk Score and Plan: 2 and Ondansetron, Dexamethasone and Midazolam  Airway Management Planned: LMA  Additional Equipment:   Intra-op Plan:   Post-operative Plan: Extubation in OR  Informed Consent: I have reviewed the patients History and Physical, chart, labs and discussed the procedure including the risks, benefits and alternatives for the proposed anesthesia with the patient or authorized representative who has indicated his/her understanding and acceptance.     Dental advisory given  Plan Discussed with: CRNA  Anesthesia Plan Comments:        Anesthesia Quick Evaluation

## 2023-05-17 NOTE — Progress Notes (Signed)
Office Note 05/17/2023  CC:  Chief Complaint  Patient presents with   Annual Exam    Pt is fasting. Yes to FLU   Patient is a 67 y.o. male who is here for annual health maintenance exam and 17-month follow-up hypertension and adult ADD. A/P as of last visit: "1 adult ADD. Doing well long-term on Concerta 54 mg every morning and Ritalin 10 mg q. afternoon. Medications refilled today.  Controlled substance contract and UDS are up-to-date.   2.  Hypertension, historically well-controlled but blood pressure appears today. No medication changes today.  He will get his home blood pressure cuff out and start checking this again to see the trend. He will call or return if consistently greater than 135/85. Basic metabolic panel today.   #3 STD screening: Patient request this today. RPR, HIV, GC chlamydia screening ordered."  INTERIM HX: Wife says he snores a lot. He has awakened himself with a gasp before.  He does not know of any witnessed apneic spells and sleep.  He usually does not feel rested upon awakening.  He does have some excessive daytime sleepiness. No morning headaches.  Home blood pressures consistently 130/80 or better.  Pt states all is going well with the med at current dosing (methylphenidate CR 54 mg every morning and methylphenidate 10 mg q. afternoon as needed): much improved focus, concentration, task completion.  Less frustration, better multitasking, less impulsivity and restlessness.  Mood is stable. No side effects from the medication.  PMP AWARE reviewed today: most recent rx for methylphenidate ER 54 mg was filled 03/26/2023, # 90, rx by me. No red flags.   Past Medical History:  Diagnosis Date   Acne    Dr. Katrinka Blazing (was on accutane at one point)   Adult ADHD 11/2017   BPH with obstruction/lower urinary tract symptoms 2015   finasteride trial 09/2015. Tamsulosin added 05/2019 urol->Surgical options discussed at that time.   Condyloma acuminata 07/2019    responded well to imiquimod   COVID 08/2019   high fever lethargic weakness x 5 days all symptoms resolved   Diverticulosis 08/2019   Noted on screening colonoscopy   Elevated PSA 2015   Bx benign 12/2014.  04/2016 prostate MRI negative. PSA stable at 4.29 as of 02/2018.  PSA doubled 08/2018-->13.2 02/2019->rpt bx benign. PSAs followed by Janetta Hora.   Episcleritis of right eye    w/mild scleritis right eye ([facility] opht 2011); also with hx of ocular varicella at age 89.   GERD (gastroesophageal reflux disease)    Tums   History of kidney stones 25 yrs ago   Hypertension    Hypogonadism male 2009   Nephrolithiasis    Osteoarthritis of left knee    Dr. Turner Daniels   Wears glasses     Past Surgical History:  Procedure Laterality Date   COLONOSCOPY  09/22/2019   X 2, normal (for strong FH of colon cancer.  Last in approx 2010 was normal.  Rpt 08/2019 NO POLYPS->repeat 10 yrs   CYSTOSCOPY WITH INSERTION OF UROLIFT  11/01/2020   Procedure: CYSTOSCOPY WITH INSERTION OF UROLIFT;  Surgeon: [name], MD;  Location: [facility];  Service: Urology;;   CYSTOSCOPY WITH LITHOLAPAXY N/A 11/01/2020   Procedure: CYSTOSCOPY WITH LITHOLAPAXY;  Surgeon: [name], MD;  Location: [facility];  Service: Urology;  Laterality: N/A;   FRACTURE SURGERY Left 1974   wrist    KNEE ARTHROSCOPY     Left: 1990s and early 2000's.   LITHOTRIPSY  1990s   PROSTATE BIOPSY  12/2014; 02/2019   2016 benign.  02/2019 benign.   ROTATOR CUFF REPAIR Right 2004   Right   THROAT SURGERY     Infected branchial cleft cyst 2007   TOTAL KNEE ARTHROPLASTY Left 03/10/2016   Procedure: TOTAL KNEE ARTHROPLASTY;  Surgeon: Marcene Corning, MD;  Location: [facility];  Service: Orthopedics;  Laterality: Left;    Family History  Problem Relation Age of Onset   Cancer Paternal Grandfather        either prostate, colon or something in that area - not definitive dx of cancer    Multiple myeloma Brother    Colon  cancer Neg Hx    Colon polyps Neg Hx    Esophageal cancer Neg Hx    Rectal cancer Neg Hx    Stomach cancer Neg Hx     Social History   Socioeconomic History   Marital status: Married    Spouse name: Not on file   Number of children: Not on file   Years of education: Not on file   Highest education level: Not on file  Occupational History   Not on file  Tobacco Use   Smoking status: Never   Smokeless tobacco: Never  Vaping Use   Vaping status: Never Used  Substance and Sexual Activity   Alcohol use: Yes    Alcohol/week: 3.0 standard drinks of alcohol    Types: 3 Glasses of wine per week    Comment: few week   Drug use: No   Sexual activity: Not on file  Other Topics Concern   Not on file  Social History Narrative   Married, 2 teenage children.   Relocated to  from [place] 2007.   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   No tobacco.  Drinks 2-3 glasses of wine per week.  No drugs.   Walks his dog about 1 mile per day.   Social Determinants of Health   Financial Resource Strain: Not on file  Food Insecurity: Not on file  Transportation Needs: Not on file  Physical Activity: Not on file  Stress: Not on file  Social Connections: Not on file  Intimate Partner Violence: Not on file    Outpatient Medications Prior to Visit  Medication Sig Dispense Refill   finasteride (PROSCAR) 5 MG tablet Take 5 mg by mouth daily.     methylphenidate (RITALIN) 10 MG tablet Take 10 mg by mouth as needed.     methylphenidate 54 MG PO CR tablet Take 1 tablet (54 mg total) by mouth every morning. 90 tablet 0   naproxen (NAPROSYN) 500 MG tablet Take 500 mg by mouth daily.     omeprazole (PRILOSEC) 20 MG capsule Take 20 mg by mouth daily.     lisinopril (ZESTRIL) 10 MG tablet Take 1 tablet (10 mg total) by mouth daily. 90 tablet 3   No facility-administered medications prior to visit.    Allergies  Allergen Reactions    Amlodipine Other (See Comments)    disequilibrium   Sulfa Antibiotics Rash    Review of Systems  Constitutional:  Negative for appetite change, chills, fatigue and fever.  HENT:  Negative for congestion, dental problem, ear pain and sore throat.   Eyes:  Negative for discharge, redness and visual disturbance.  Respiratory:  Negative for cough, chest tightness, shortness of breath and wheezing.   Cardiovascular:  Negative for chest pain, palpitations and leg swelling.  Gastrointestinal:  Negative for abdominal pain, blood in stool, diarrhea, nausea and vomiting.  Genitourinary:  Negative for difficulty urinating, dysuria, flank pain, frequency, hematuria and urgency.  Musculoskeletal:  Negative for arthralgias, back pain, joint swelling, myalgias and neck stiffness.  Skin:  Negative for pallor and rash.  Neurological:  Negative for dizziness, speech difficulty, weakness and headaches.  Hematological:  Negative for adenopathy. Does not bruise/bleed easily.  Psychiatric/Behavioral:  Negative for confusion and sleep disturbance. The patient is not nervous/anxious.     PE;    05/17/2023    8:16 AM 05/17/2023    8:13 AM 05/11/2023    1:19 PM  Vitals with BMI  Height  6\' 1"  6\' 0"   Weight  274 lbs 13 oz 250 lbs  BMI  36.26 33.9  Systolic 138 144   Diastolic 82 84   Pulse  76    Gen: Alert, well appearing.  Patient is oriented to person, place, time, and situation. AFFECT: pleasant, lucid thought and speech. ENT: Ears: EACs clear, normal epithelium.  TMs with good light reflex and landmarks bilaterally.  Eyes: no injection, icteris, swelling, or exudate.  EOMI, PERRLA. Nose: no drainage or turbinate edema/swelling.  No injection or focal lesion.  Mouth: lips without lesion/swelling.  Oral mucosa pink and moist.  Dentition intact and without obvious caries or gingival swelling.  Oropharynx without erythema, exudate, or swelling.  Neck: supple/nontender.  No LAD, mass, or TM.  Carotid pulses  2+ bilaterally, without bruits. CV: RRR, no m/r/g.   LUNGS: CTA bilat, nonlabored resps, good aeration in all lung fields. ABD: soft, NT, ND, BS normal.  No hepatospenomegaly or mass.  No bruits. EXT: no clubbing, cyanosis, or edema.  Musculoskeletal: no joint swelling, erythema, warmth, or tenderness.  ROM of all joints intact. Skin - no sores or suspicious lesions or rashes or color changes  Pertinent labs:  Lab Results  Component Value Date   TSH 3.37 05/16/2021   Lab Results  Component Value Date   WBC 5.9 05/05/2022   HGB 14.3 05/05/2022   HCT 42.1 05/05/2022   MCV 89.2 05/05/2022   PLT 239.0 05/05/2022   Lab Results  Component Value Date   CREATININE 0.99 11/06/2022   BUN 17 11/06/2022   NA 140 11/06/2022   K 5.0 11/06/2022   CL 105 11/06/2022   CO2 24 11/06/2022   Lab Results  Component Value Date   ALT 25 05/05/2022   AST 23 05/05/2022   ALKPHOS 63 05/05/2022   BILITOT 0.6 05/05/2022   Lab Results  Component Value Date   CHOL 161 05/05/2022   Lab Results  Component Value Date   HDL 50.40 05/05/2022   Lab Results  Component Value Date   LDLCALC 91 05/05/2022   Lab Results  Component Value Date   TRIG 94.0 05/05/2022   Lab Results  Component Value Date   CHOLHDL 3 05/05/2022   Lab Results  Component Value Date   PSA 11.70 10/24/2021   PSA 10.25 (H) 05/16/2021   PSA 8.32 09/27/2020   PSA 8.32 09/27/2020   ASSESSMENT AND PLAN:   #1 health maintenance exam: Reviewed age and gender appropriate health maintenance issues (prudent diet, regular exercise, health risks of tobacco and excessive alcohol, use of seatbelts, fire alarms in home, use of sunscreen).  Also reviewed age and gender appropriate health screening as well as vaccine recommendations. Vaccines: Flu->given today.  Prevnar 20 today.   Otherwise UTD. Labs: cbc,cmet, lipids.  He also request STD  screening--> GC chlamydia urine, RPR, and HIV antibody ordered. Prostate ca screening:  History of elevated PSAs.  Followed by urology. Colon ca screening: recall 2031.  #2 excessive daytime sleepiness, snoring, suspected OSA. Refer to sleep MD today.  3.  Adult ADD, doing well long-term on methylphenidate CR 54 mg every morning.  He also uses Ritalin 10 mg q. afternoon as needed. Urine tox screen today.  #4 hypertension, well-controlled on lisinopril 10 mg a day.  #5 bladder stones.  Patient had procedure tomorrow to have these extracted.  An After Visit Summary was printed and given to the patient.  FOLLOW UP:  Return in about 6 months (around 11/14/2023).  Signed:  Santiago Bumpers, MD           05/17/2023

## 2023-05-18 ENCOUNTER — Ambulatory Visit (HOSPITAL_BASED_OUTPATIENT_CLINIC_OR_DEPARTMENT_OTHER): Payer: BC Managed Care – PPO | Admitting: Anesthesiology

## 2023-05-18 ENCOUNTER — Ambulatory Visit (HOSPITAL_BASED_OUTPATIENT_CLINIC_OR_DEPARTMENT_OTHER)
Admission: RE | Admit: 2023-05-18 | Discharge: 2023-05-18 | Disposition: A | Discharge status: Home / Self Care | Payer: BC Managed Care – PPO | Attending: Urology | Admitting: Urology

## 2023-05-18 ENCOUNTER — Encounter (HOSPITAL_BASED_OUTPATIENT_CLINIC_OR_DEPARTMENT_OTHER): Payer: Self-pay | Admitting: Urology

## 2023-05-18 ENCOUNTER — Encounter (HOSPITAL_BASED_OUTPATIENT_CLINIC_OR_DEPARTMENT_OTHER)
Admission: RE | Disposition: A | Discharge status: Home / Self Care | Payer: Self-pay | Source: Home / Self Care | Attending: Urology

## 2023-05-18 DIAGNOSIS — I10 Essential (primary) hypertension: Secondary | ICD-10-CM | POA: Insufficient documentation

## 2023-05-18 DIAGNOSIS — Z96652 Presence of left artificial knee joint: Secondary | ICD-10-CM | POA: Insufficient documentation

## 2023-05-18 DIAGNOSIS — N210 Calculus in bladder: Secondary | ICD-10-CM | POA: Diagnosis not present

## 2023-05-18 DIAGNOSIS — K219 Gastro-esophageal reflux disease without esophagitis: Secondary | ICD-10-CM | POA: Diagnosis not present

## 2023-05-18 DIAGNOSIS — N400 Enlarged prostate without lower urinary tract symptoms: Secondary | ICD-10-CM | POA: Insufficient documentation

## 2023-05-18 DIAGNOSIS — F909 Attention-deficit hyperactivity disorder, unspecified type: Secondary | ICD-10-CM | POA: Insufficient documentation

## 2023-05-18 DIAGNOSIS — Z01818 Encounter for other preprocedural examination: Secondary | ICD-10-CM

## 2023-05-18 DIAGNOSIS — Z8616 Personal history of COVID-19: Secondary | ICD-10-CM | POA: Insufficient documentation

## 2023-05-18 HISTORY — PX: CYSTOSCOPY WITH LITHOLAPAXY: SHX1425

## 2023-05-18 HISTORY — DX: Pure hypercholesterolemia, unspecified: E78.00

## 2023-05-18 LAB — POCT I-STAT, CHEM 8
BUN: 19 mg/dL (ref 8–23)
Calcium, Ion: 1.25 mmol/L (ref 1.15–1.40)
Chloride: 105 mmol/L (ref 98–111)
Creatinine, Ser: 0.9 mg/dL (ref 0.61–1.24)
Glucose, Bld: 98 mg/dL (ref 70–99)
HCT: 41 % (ref 39.0–52.0)
Hemoglobin: 13.9 g/dL (ref 13.0–17.0)
Potassium: 4.2 mmol/L (ref 3.5–5.1)
Sodium: 139 mmol/L (ref 135–145)
TCO2: 21 mmol/L — ABNORMAL LOW (ref 22–32)

## 2023-05-18 LAB — RPR: RPR Ser Ql: NONREACTIVE

## 2023-05-18 LAB — HIV ANTIBODY (ROUTINE TESTING W REFLEX): HIV 1&2 Ab, 4th Generation: NONREACTIVE

## 2023-05-18 SURGERY — CYSTOSCOPY, WITH BLADDER CALCULUS LITHOLAPAXY
Anesthesia: General | Site: Bladder

## 2023-05-18 MED ORDER — MIDAZOLAM HCL 2 MG/2ML IJ SOLN
INTRAMUSCULAR | Status: AC
  Filled 2023-05-18: qty 2

## 2023-05-18 MED ORDER — LIDOCAINE 2% (20 MG/ML) 5 ML SYRINGE
INTRAMUSCULAR | Status: DC | PRN
  Administered 2023-05-18: 60 mg via INTRAVENOUS

## 2023-05-18 MED ORDER — PROPOFOL 10 MG/ML IV BOLUS
INTRAVENOUS | Status: DC | PRN
  Administered 2023-05-18: 200 mg via INTRAVENOUS

## 2023-05-18 MED ORDER — PHENYLEPHRINE 80 MCG/ML (10ML) SYRINGE FOR IV PUSH (FOR BLOOD PRESSURE SUPPORT)
PREFILLED_SYRINGE | INTRAVENOUS | Status: AC
  Filled 2023-05-18: qty 10

## 2023-05-18 MED ORDER — KETOROLAC TROMETHAMINE 30 MG/ML IJ SOLN
INTRAMUSCULAR | Status: AC
  Filled 2023-05-18: qty 1

## 2023-05-18 MED ORDER — KETOROLAC TROMETHAMINE 30 MG/ML IJ SOLN
INTRAMUSCULAR | Status: DC | PRN
Start: 2023-05-18 — End: 2023-05-18
  Administered 2023-05-18: 30 mg via INTRAVENOUS

## 2023-05-18 MED ORDER — SODIUM CHLORIDE 0.9 % IR SOLN
Status: DC | PRN
  Administered 2023-05-18 (×3): 3000 mL

## 2023-05-18 MED ORDER — LIDOCAINE HCL (PF) 2 % IJ SOLN
INTRAMUSCULAR | Status: AC
  Filled 2023-05-18: qty 5

## 2023-05-18 MED ORDER — LIDOCAINE HCL URETHRAL/MUCOSAL 2 % EX GEL
CUTANEOUS | Status: DC | PRN
  Administered 2023-05-18: 1 via URETHRAL

## 2023-05-18 MED ORDER — ONDANSETRON HCL 4 MG/2ML IJ SOLN
INTRAMUSCULAR | Status: AC
  Filled 2023-05-18: qty 2

## 2023-05-18 MED ORDER — ONDANSETRON HCL 4 MG/2ML IJ SOLN
INTRAMUSCULAR | Status: DC | PRN
  Administered 2023-05-18: 4 mg via INTRAVENOUS

## 2023-05-18 MED ORDER — DEXAMETHASONE SODIUM PHOSPHATE 10 MG/ML IJ SOLN
INTRAMUSCULAR | Status: AC
  Filled 2023-05-18: qty 1

## 2023-05-18 MED ORDER — 0.9 % SODIUM CHLORIDE (POUR BTL) OPTIME
TOPICAL | Status: DC | PRN
Start: 2023-05-18 — End: 2023-05-18
  Administered 2023-05-18: 500 mL

## 2023-05-18 MED ORDER — WHITE PETROLATUM EX OINT
TOPICAL_OINTMENT | CUTANEOUS | Status: AC
  Filled 2023-05-18: qty 5

## 2023-05-18 MED ORDER — DEXAMETHASONE SODIUM PHOSPHATE 10 MG/ML IJ SOLN
INTRAMUSCULAR | Status: DC | PRN
  Administered 2023-05-18: 10 mg via INTRAVENOUS

## 2023-05-18 MED ORDER — FENTANYL CITRATE (PF) 100 MCG/2ML IJ SOLN
INTRAMUSCULAR | Status: DC | PRN
  Administered 2023-05-18: 25 ug via INTRAVENOUS
  Administered 2023-05-18: 50 ug via INTRAVENOUS
  Administered 2023-05-18: 25 ug via INTRAVENOUS

## 2023-05-18 MED ORDER — ACETAMINOPHEN 500 MG PO TABS
1000.0000 mg | ORAL_TABLET | Freq: Once | ORAL | Status: AC
  Administered 2023-05-18: 1000 mg via ORAL

## 2023-05-18 MED ORDER — PHENYLEPHRINE 80 MCG/ML (10ML) SYRINGE FOR IV PUSH (FOR BLOOD PRESSURE SUPPORT)
PREFILLED_SYRINGE | INTRAVENOUS | Status: DC | PRN
  Administered 2023-05-18: 160 ug via INTRAVENOUS

## 2023-05-18 MED ORDER — CEFAZOLIN SODIUM-DEXTROSE 2-4 GM/100ML-% IV SOLN
INTRAVENOUS | Status: AC
  Filled 2023-05-18: qty 100

## 2023-05-18 MED ORDER — MIDAZOLAM HCL 2 MG/2ML IJ SOLN
INTRAMUSCULAR | Status: DC | PRN
  Administered 2023-05-18: 2 mg via INTRAVENOUS

## 2023-05-18 MED ORDER — FENTANYL CITRATE (PF) 100 MCG/2ML IJ SOLN: 25.0000 ug | INTRAMUSCULAR | Status: DC | PRN

## 2023-05-18 MED ORDER — FENTANYL CITRATE (PF) 100 MCG/2ML IJ SOLN
INTRAMUSCULAR | Status: AC
  Filled 2023-05-18: qty 2

## 2023-05-18 MED ORDER — LACTATED RINGERS IV SOLN: INTRAVENOUS | Status: DC

## 2023-05-18 MED ORDER — CEFAZOLIN SODIUM-DEXTROSE 2-4 GM/100ML-% IV SOLN
2.0000 g | INTRAVENOUS | Status: AC
  Administered 2023-05-18: 2 g via INTRAVENOUS

## 2023-05-18 MED ORDER — PROPOFOL 10 MG/ML IV BOLUS
INTRAVENOUS | Status: AC
  Filled 2023-05-18: qty 20

## 2023-05-18 MED ORDER — ACETAMINOPHEN 500 MG PO TABS
ORAL_TABLET | ORAL | Status: AC
  Filled 2023-05-18: qty 2

## 2023-05-18 SURGICAL SUPPLY — 21 items
BAG DRAIN URO-CYSTO SKYTR STRL (DRAIN) ×1 IMPLANT
BAG DRN UROCATH (DRAIN) ×1
CLOTH BEACON ORANGE TIMEOUT ST (SAFETY) ×1 IMPLANT
GLOVE BIO SURGEON STRL SZ7.5 (GLOVE) ×1 IMPLANT
GLOVE BIO SURGEON STRL SZ8 (GLOVE) IMPLANT
GOWN STRL REUS W/TWL LRG LVL3 (GOWN DISPOSABLE) ×1 IMPLANT
IV NS IRRIG 3000ML ARTHROMATIC (IV SOLUTION) IMPLANT
KIT TURNOVER CYSTO (KITS) ×1 IMPLANT
LASER FIB FLEXIVA PULSE ID 550 (Laser) IMPLANT
LASER FIB FLEXIVA PULSE ID 910 (Laser) ×1 IMPLANT
MANIFOLD NEPTUNE II (INSTRUMENTS) ×1 IMPLANT
NS IRRIG 500ML POUR BTL (IV SOLUTION) ×1 IMPLANT
PACK CYSTO (CUSTOM PROCEDURE TRAY) ×1 IMPLANT
SLEEVE SCD COMPRESS KNEE MED (STOCKING) ×1 IMPLANT
SYR TOOMEY IRRIG 70ML (MISCELLANEOUS) ×1
SYRINGE TOOMEY IRRIG 70ML (MISCELLANEOUS) IMPLANT
TRACTIP FLEXIVA PULS ID 200XHI (Laser) IMPLANT
TRACTIP FLEXIVA PULSE ID 200 (Laser)
TUBE CONNECTING 12X1/4 (SUCTIONS) ×1 IMPLANT
TUBING UROLOGY SET (TUBING) ×1 IMPLANT
WATER STERILE IRR 3000ML UROMA (IV SOLUTION) ×1 IMPLANT

## 2023-05-18 NOTE — Op Note (Signed)
Preoperative diagnosis: BPH and bladder stones Postoperative diagnosis: Same  Procedure: Cystoscopy with laser cystolitholopaxy less than 2.5 cm  Surgeon: [name]  Anesthesia: General  Indication for procedure: Carlos Ellis is a 67 year old male with a long history of BPH.  He is being prepared for water jet ablation of the prostate.  He had about 10 small stones in the bladder.  These need to be removed.  He has a long history of BPH.  He is on finasteride now with a slightly better stream.  His wife recalls an episode of urinary retention a few years ago.  Findings: On cystoscopy the urethra was unremarkable, the prostate was obstructing the bladder with trilobar hypertrophy.  The bladder appeared normal without lesion.  There were about 10 small stones.  I evacuated 6 small stones through the scope and 4 stones required laser lithotripsy for evacuation.  Otherwise there was minimal trabeculation and no mucosal lesions.  The trigone and ureteral orifice ease were not readily visualized because of the large median lobe.  On exam under anesthesia the penis was circumcised without mass or lesion.  The glans and meatus appeared normal.  No penile or scrotal lesions.  On DRE the prostate was about 50 g and smooth without hard area or nodule.  Description of procedure: After consent was obtained patient brought to the operating room.  After adequate anesthesia he was placed in lithotomy position and prepped and draped in the usual sterile fashion.  Timeout was performed to confirm the patient and procedure.  Cystoscope was passed per urethra and the bladder carefully inspected.  Some of the small stones were drained.  I swapped that out for the continuous laser flow sheath and a 1000 m laser fiber was passed.  A setting of 0.5 and 10 worked the best on the stones.  4 stones were fragmented and all the fragments were evacuated.  The bladder was filled with some irrigant.  There was a minimal mucosal bleeding  from angling down on the median lobe.  The scope was backed out and removed.  Lidocaine jelly was instilled per urethra.  An exam under anesthesia was performed.  He was awakened and taken the cover room in stable condition.  Complications: None  Blood loss: Minimal  Specimens: None to lab.  Stone fragments given the patient.  Drains: None  Disposition: Patient stable to PACU.

## 2023-05-18 NOTE — Transfer of Care (Signed)
Immediate Anesthesia Transfer of Care Note  Patient: Carlos Ellis  Procedure(s) Performed: Procedure(s) (LRB): CYSTOSCOPY WITH LITHOLAPAXY (N/A)  Patient Location: PACU  Anesthesia Type: General  Level of Consciousness: awake, oriented, sedated and patient cooperative  Airway & Oxygen Therapy: Patient Spontanous Breathing and Patient connected to face mask oxygen  Post-op Assessment: Report given to PACU RN and Post -op Vital signs reviewed and stable  Post vital signs: Reviewed and stable  Complications: No apparent anesthesia complications Last Vitals:  Vitals Value Taken Time  BP 149/80 05/18/23 0830  Temp    Pulse 71 05/18/23 0830  Resp 13 05/18/23 0830  SpO2 97 % 05/18/23 0830  Vitals shown include unfiled device data.  Last Pain:  Vitals:   05/18/23 0601  TempSrc: Oral  PainSc: 0-No pain      Patients Stated Pain Goal: 7 (05/18/23 0601)  Complications: No notable events documented.

## 2023-05-18 NOTE — Anesthesia Procedure Notes (Signed)
Procedure Name: LMA Insertion Date/Time: 05/18/2023 7:39 AM  Performed by: Francie Massing, CRNAPre-anesthesia Checklist: Patient identified, Emergency Drugs available, Suction available and Patient being monitored Patient Re-evaluated:Patient Re-evaluated prior to induction Oxygen Delivery Method: Circle system utilized Preoxygenation: Pre-oxygenation with 100% oxygen Induction Type: IV induction Ventilation: Mask ventilation without difficulty LMA: LMA inserted LMA Size: 5.0 Number of attempts: 1 Airway Equipment and Method: Bite block Placement Confirmation: positive ETCO2 Tube secured with: Tape Dental Injury: Teeth and Oropharynx as per pre-operative assessment

## 2023-05-18 NOTE — Anesthesia Postprocedure Evaluation (Signed)
Anesthesia Post Note  Patient: Carlos Ellis  Procedure(s) Performed: CYSTOSCOPY WITH LITHOLAPAXY (Bladder)     Patient location during evaluation: PACU Anesthesia Type: General Level of consciousness: awake and alert Pain management: pain level controlled Vital Signs Assessment: post-procedure vital signs reviewed and stable Respiratory status: spontaneous breathing, nonlabored ventilation, respiratory function stable and patient connected to nasal cannula oxygen Cardiovascular status: blood pressure returned to baseline and stable Postop Assessment: no apparent nausea or vomiting Anesthetic complications: no  No notable events documented.  Last Vitals:  Vitals:   05/18/23 0923 05/18/23 0950  BP:    Pulse: 62 64  Resp: 12 (P) 19  Temp: 36.7 C   SpO2: 98% 97%    Last Pain:  Vitals:   05/18/23 0845  TempSrc:   PainSc: 0-No pain                 Tavarion Babington L Avianna Moynahan

## 2023-05-18 NOTE — H&P (Signed)
H&P  Chief Complaint: BPH, bladder stones  History of Present Illness: Carlos Ellis is a 67 year old male with a history of BPH.  On office cystoscopy he had many small bladder stones.  He is brought today for cystolitholopaxy in preparation down the road for a staged water jet ablation of the prostate.  He is well without dysuria or gross hematuria.  No congestion or fever.  We added finasteride and he noticed his stream is stronger but he like to get off medicines in the long run.  He has a history of PSA elevation with 2 negative biopsies and a negative MRI.  The stones were visible on the MRI.  Prostate measured 110 g.  Past Medical History:  Diagnosis Date   Acne    Dr. Katrinka Blazing (was on accutane at one point)   Adult ADHD 11/2017   BPH with obstruction/lower urinary tract symptoms 2015   finasteride trial 09/2015. Tamsulosin added 05/2019 urol->Surgical options discussed at that time.   Condyloma acuminata 07/2019   responded well to imiquimod   COVID 08/2019   high fever lethargic weakness x 5 days all symptoms resolved   Diverticulosis 08/2019   Noted on screening colonoscopy   Elevated PSA 2015   Bx benign 12/2014.  04/2016 prostate MRI negative. PSA stable at 4.29 as of 02/2018.  PSA doubled 08/2018-->13.2 02/2019->rpt bx benign. PSAs followed by Janetta Hora.   Episcleritis of right eye    w/mild scleritis right eye ([facility] opht 2011); also with hx of ocular varicella at age 74.   GERD (gastroesophageal reflux disease)    Tums   History of kidney stones 25 yrs ago   Hypertension    Hypogonadism male 2009   Nephrolithiasis    Osteoarthritis of left knee    Dr. Turner Daniels   Wears glasses    Past Surgical History:  Procedure Laterality Date   COLONOSCOPY  09/22/2019   X 2, normal (for strong FH of colon cancer.  Last in approx 2010 was normal.  Rpt 08/2019 NO POLYPS->repeat 10 yrs   CYSTOSCOPY WITH INSERTION OF UROLIFT  11/01/2020   Procedure: CYSTOSCOPY WITH INSERTION OF UROLIFT;  Surgeon: [name], MD;  Location: [facility];  Service: Urology;;   CYSTOSCOPY WITH LITHOLAPAXY N/A 11/01/2020   Procedure: CYSTOSCOPY WITH LITHOLAPAXY;  Surgeon: [name], MD;  Location: [facility];  Service: Urology;  Laterality: N/A;   FRACTURE SURGERY Left 1974   wrist    KNEE ARTHROSCOPY     Left: 1990s and early 2000's.   LITHOTRIPSY     1990s   PROSTATE BIOPSY  12/2014; 02/2019   2016 benign.  02/2019 benign.   ROTATOR CUFF REPAIR Right 2004   Right   THROAT SURGERY     Infected branchial cleft cyst 2007   TOTAL KNEE ARTHROPLASTY Left 03/10/2016   Procedure: TOTAL KNEE ARTHROPLASTY;  Surgeon: Marcene Corning, MD;  Location: [facility];  Service: Orthopedics;  Laterality: Left;    Home Medications:  Medications Prior to Admission  Medication Sig Dispense Refill Last Dose   finasteride (PROSCAR) 5 MG tablet Take 5 mg by mouth daily.   05/17/2023   lisinopril (ZESTRIL) 10 MG tablet Take 1 tablet (10 mg total) by mouth daily. 90 tablet 3 05/17/2023   methylphenidate (RITALIN) 10 MG tablet Take 10 mg by mouth as needed.   Past Month   methylphenidate 54 MG PO CR tablet Take 1 tablet (54 mg total) by mouth every morning. 90 tablet 0 05/17/2023  naproxen (NAPROSYN) 500 MG tablet Take 500 mg by mouth daily.   05/17/2023   omeprazole (PRILOSEC) 20 MG capsule Take 20 mg by mouth daily.   05/17/2023   Allergies:  Allergies  Allergen Reactions   Amlodipine Other (See Comments)    disequilibrium   Sulfa Antibiotics Rash    Family History  Problem Relation Age of Onset   Cancer Paternal Grandfather        either prostate, colon or something in that area - not definitive dx of cancer    Multiple myeloma Brother    Colon cancer Neg Hx    Colon polyps Neg Hx    Esophageal cancer Neg Hx    Rectal cancer Neg Hx    Stomach cancer Neg Hx    Social History:  reports that he has never smoked. He has never used smokeless tobacco. He reports current alcohol use of about  3.0 standard drinks of alcohol per week. He reports that he does not use drugs.  ROS: A complete review of systems was performed.  All systems are negative except for pertinent findings as noted. Review of Systems  All other systems reviewed and are negative.    Physical Exam:  Vital signs in last 24 hours: Temp:  [97.5 F (36.4 C)] 97.5 F (36.4 C) (09/24 0601) Pulse Rate:  [73-76] 73 (09/24 0601) Resp:  [17] 17 (09/24 0601) BP: (138-148)/(82-85) 148/85 (09/24 0601) SpO2:  [96 %-98 %] 98 % (09/24 0601) Weight:  [124.1 kg-124.6 kg] 124.1 kg (09/24 0601) General:  Alert and oriented, No acute distress HEENT: Normocephalic, atraumatic Cardiovascular: Regular rate and rhythm Lungs: Regular rate and effort Abdomen: Soft, nontender, nondistended, no abdominal masses Back: No CVA tenderness Extremities: No edema Neurologic: Grossly intact  Laboratory Data:  Results for orders placed or performed during the hospital encounter of 05/18/23 (from the past 24 hour(s))  I-STAT, chem 8     Status: Abnormal   Collection Time: 05/18/23  6:16 AM  Result Value Ref Range   Sodium 139 135 - 145 mmol/L   Potassium 4.2 3.5 - 5.1 mmol/L   Chloride 105 98 - 111 mmol/L   BUN 19 8 - 23 mg/dL   Creatinine, Ser 1.19 0.61 - 1.24 mg/dL   Glucose, Bld 98 70 - 99 mg/dL   Calcium, Ion 1.47 8.29 - 1.40 mmol/L   TCO2 21 (L) 22 - 32 mmol/L   Hemoglobin 13.9 13.0 - 17.0 g/dL   HCT 56.2 13.0 - 86.5 %   No results found for this or any previous visit (from the past 240 hour(s)). Creatinine: Recent Labs    05/17/23 0830 05/18/23 0616  CREATININE 1.07 0.90    Impression/Assessment:  BPH, bladder stones-patient on maximal medical therapy would like to ultimately get off medications-  Plan:  I discussed with the patient the nature, potential benefits, risks and alternatives to cystolitholopaxy which can be done with a combination of mechanical or laser lithotripsy, including side effects of the  proposed treatment, the likelihood of the patient achieving the goals of the procedure, and any potential problems that might occur during the procedure or recuperation. All questions answered. Patient elects to proceed.  Discussed possible need for Foley catheter if any gross hematuria or bladder injury among other risks.  [name] 05/18/2023, 7:25 AM

## 2023-05-18 NOTE — Discharge Instr - Supplementary Instructions (Signed)
May take Tylenol after 12pm if needed for discomfort.  May take Ibuprofen after 2:30pm if needed for discomfort.

## 2023-05-18 NOTE — Discharge Instructions (Addendum)
Cystoscopy with laser of bladder stones, Care After The following information offers guidance on how to care for yourself after your procedure. Your health care provider may also give you more specific instructions. If you have problems or questions, contact your health care provider. What can I expect after the procedure? After the procedure, it is common to have: Mild pain and burning in your bladder or kidney area when you urinate. Small amounts of blood in your urine. A sudden urge to urinate. A need to urinate more often than usual. Follow these instructions at home: Medicines Take over-the-counter and prescription medicines only as told by your health care provider. If you were prescribed an antibiotic medicine, take it as told by your health care provider. Do not stop using the antibiotic even if you start to feel better. Activity Rest as told by your health care provider. If you were given a sedative during the procedure, it can affect you for several hours. Do not drive or operate machinery until your health care provider says that it is safe. Return to your normal activities as told by your health care provider. Ask your health care provider what activities are safe for you. General instructions  Take a warm bath to relieve any burning feeling around your urethra. Hold a warm, damp washcloth over the urethral area to ease pain. It is up to you to get the results of your procedure. Ask your health care provider, or the department that is doing the procedure, when your results will be ready. Keep all follow-up visits. This is important. Contact a health care provider if: Your symptoms do not improve within 24 hours, and you keep having: Burning when you urinate. More blood in your urine. Pain when you urinate. An urgent need to urinate. A need to urinate more often than usual. Get help right away if: You have a fever. You have a lot of blood in your urine. You have bright red  blood in your urine. You are passing blood clots in your urine. You have pain and cannot urinate. Summary After the procedure, it is common to have mild pain in your bladder or kidney area, mild burning with urination, and some blood. Take over-the-counter and prescription medicines only as told by your health care provider. If you were prescribed an antibiotic medicine, do not stop using the antibiotic even if you start to feel better. Rest after the procedure. Follow instructions from your health care provider for home care. Get help right away if you have a lot of blood, bright red blood, or blood clots in your urine, severe pain, or fever. This information is not intended to replace advice given to you by your health care provider. Make sure you discuss any questions you have with your health care provider. Document Revised: 07/20/2021 Document Reviewed: 07/20/2021 Elsevier Patient Education  2024 [employer]   Post Anesthesia Home Care Instructions  Activity: Get plenty of rest for the remainder of the day. A responsible adult should stay with you for 24 hours following the procedure.  For the next 24 hours, DO NOT: -Drive a car -Advertising copywriter -Drink alcoholic beverages -Take any medication unless instructed by your physician -Make any legal decisions or sign important papers.  Meals: Start with liquid foods such as gelatin or soup. Progress to regular foods as tolerated. Avoid greasy, spicy, heavy foods. If nausea and/or vomiting occur, drink only clear liquids until the nausea and/or vomiting subsides. Call your physician if vomiting continues.  Special  Instructions/Symptoms: Your throat may feel dry or sore from the anesthesia or the breathing tube placed in your throat during surgery. If this causes discomfort, gargle with warm salt water. The discomfort should disappear within 24 hours.

## 2023-05-19 ENCOUNTER — Encounter: Payer: Self-pay | Admitting: Family Medicine

## 2023-05-19 ENCOUNTER — Encounter (HOSPITAL_BASED_OUTPATIENT_CLINIC_OR_DEPARTMENT_OTHER): Payer: Self-pay | Admitting: Urology

## 2023-05-19 LAB — URINE CYTOLOGY ANCILLARY ONLY
Chlamydia: NEGATIVE
Comment: NEGATIVE
Comment: NORMAL
Neisseria Gonorrhea: NEGATIVE

## 2023-05-19 NOTE — Telephone Encounter (Signed)
CO2 level 21 is nothing to worry about.  Just call your insurer and ask them if they cover "coronary calcium score" test. If not covered by insurer, I believe the out of pocket cost is 100$ at [facility] med center [facility] but they could confirm this when they call you to set up appointment.

## 2023-05-20 LAB — DRUG MONITORING PANEL 376104, URINE
Amphetamines: NEGATIVE ng/mL (ref <500)
Barbiturates: NEGATIVE ng/mL (ref <300)
Benzodiazepines: NEGATIVE ng/mL (ref <100)
Cocaine Metabolite: NEGATIVE ng/mL (ref <150)
Desmethyltramadol: NEGATIVE ng/mL (ref <100)
Opiates: NEGATIVE ng/mL (ref <100)
Oxycodone: NEGATIVE ng/mL (ref <100)
Tramadol: NEGATIVE ng/mL (ref <100)

## 2023-05-20 LAB — DM TEMPLATE

## 2023-05-25 ENCOUNTER — Ambulatory Visit
Admission: EM | Admit: 2023-05-25 | Discharge: 2023-05-25 | Disposition: A | Discharge status: Home / Self Care | Payer: BC Managed Care – PPO | Attending: Emergency Medicine | Admitting: Emergency Medicine

## 2023-05-25 DIAGNOSIS — J029 Acute pharyngitis, unspecified: Secondary | ICD-10-CM

## 2023-05-25 DIAGNOSIS — R0982 Postnasal drip: Secondary | ICD-10-CM | POA: Diagnosis not present

## 2023-05-25 LAB — GROUP A STREP BY PCR: Group A Strep by PCR: NOT DETECTED

## 2023-05-25 NOTE — ED Triage Notes (Signed)
Sx x 4 day. Sore throat, nasal drainage.

## 2023-05-25 NOTE — ED Provider Notes (Signed)
[facility]    CSN: [id number] Arrival date & time: 05/25/23  1419      History   Chief Complaint Chief Complaint  Patient presents with   Sore Throat   post nasal drip    HPI Carlos Ellis is a 67 y.o. male.   67 year old male pt, Carlos Ellis, presents to urgent care for sore throat and post nasal discharge x 4 days. Pt states he has been using over the counter med for symptom management.   The history is provided by the patient. No language interpreter was used.    Past Medical History:  Diagnosis Date   Acne    Dr. Katrinka Blazing (was on accutane at one point)   Adult ADHD 11/2017   BPH with obstruction/lower urinary tract symptoms 2015   finasteride trial 09/2015. Tamsulosin added 05/2019 urol->Surgical options discussed at that time.   Condyloma acuminata 07/2019   responded well to imiquimod   COVID 08/2019   high fever lethargic weakness x 5 days all symptoms resolved   Diverticulosis 08/2019   Noted on screening colonoscopy   Elevated PSA 2015   Bx benign 12/2014.  04/2016 prostate MRI negative. PSA stable at 4.29 as of 02/2018.  PSA doubled 08/2018-->13.2 02/2019->rpt bx benign. PSAs followed by Janetta Hora.   Episcleritis of right eye    w/mild scleritis right eye ([facility] opht 2011); also with hx of ocular varicella at age 64.   GERD (gastroesophageal reflux disease)    Tums   History of kidney stones 25 yrs ago   Hypercholesterolemia    frhm risk= 14%   Hypertension    Hypogonadism male 2009   Nephrolithiasis    Osteoarthritis of left knee    Dr. Turner Daniels    Patient Active Problem List   Diagnosis Date Noted   Viral pharyngitis 05/25/2023   PND (post-nasal drip) 05/25/2023   Hamstring strain, right, initial encounter 08/19/2020   Primary osteoarthritis of left knee 03/10/2016   Snoring 09/27/2015   Hypersomnia 09/27/2015   Obesity 09/27/2015   Health maintenance examination 02/14/2012   Osteoarthritis    Depression    GERD (gastroesophageal reflux  disease)    Nephrolithiasis    Episcleritis of right eye    Gout    Acne    Bronchitis 10/06/2011   Hypogonadism, male 05/18/2011   Borderline hyperlipidemia     Past Surgical History:  Procedure Laterality Date   COLONOSCOPY  09/22/2019   X 2, normal (for strong FH of colon cancer.  Last in approx 2010 was normal.  Rpt 08/2019 NO POLYPS->repeat 10 yrs   CYSTOSCOPY WITH INSERTION OF UROLIFT  11/01/2020   Procedure: CYSTOSCOPY WITH INSERTION OF UROLIFT;  Surgeon: [name], MD;  Location: [facility];  Service: Urology;;   CYSTOSCOPY WITH LITHOLAPAXY N/A 11/01/2020   Procedure: CYSTOSCOPY WITH LITHOLAPAXY;  Surgeon: [name], MD;  Location: [facility];  Service: Urology;  Laterality: N/A;   CYSTOSCOPY WITH LITHOLAPAXY N/A 05/18/2023   Procedure: CYSTOSCOPY WITH LITHOLAPAXY;  Surgeon: [name], MD;  Location: [facility];  Service: Urology;  Laterality: N/A;   FRACTURE SURGERY Left 1974   wrist    KNEE ARTHROSCOPY     Left: 1990s and early 2000's.   LITHOTRIPSY     1990s   PROSTATE BIOPSY  12/2014; 02/2019   2016 benign.  02/2019 benign.   ROTATOR CUFF REPAIR Right 2004   Right   THROAT SURGERY  Infected branchial cleft cyst 2007   TOTAL KNEE ARTHROPLASTY Left 03/10/2016   Procedure: TOTAL KNEE ARTHROPLASTY;  Surgeon: Marcene Corning, MD;  Location: [facility];  Service: Orthopedics;  Laterality: Left;       Home Medications    Prior to Admission medications   Medication Sig Start Date End Date Taking? Authorizing Provider  finasteride (PROSCAR) 5 MG tablet Take 5 mg by mouth daily.   Yes [provider]  lisinopril (ZESTRIL) 10 MG tablet Take 1 tablet (10 mg total) by mouth daily. 05/17/23  Yes McGowen, Maryjean Morn, MD  methylphenidate 54 MG PO CR tablet Take 1 tablet (54 mg total) by mouth every morning. 03/25/23  Yes McGowen, Maryjean Morn, MD  omeprazole (PRILOSEC) 20 MG capsule Take 20 mg by mouth daily.    Yes [provider]  methylphenidate (RITALIN) 10 MG tablet Take 10 mg by mouth as needed.    [provider]  naproxen (NAPROSYN) 500 MG tablet Take 500 mg by mouth daily. 03/01/20   [provider]    Family History Family History  Problem Relation Age of Onset   Cancer Paternal Grandfather        either prostate, colon or something in that area - not definitive dx of cancer    Multiple myeloma Brother    Colon cancer Neg Hx    Colon polyps Neg Hx    Esophageal cancer Neg Hx    Rectal cancer Neg Hx    Stomach cancer Neg Hx     Social History Social History   Tobacco Use   Smoking status: Never   Smokeless tobacco: Never  Vaping Use   Vaping status: Never Used  Substance Use Topics   Alcohol use: Yes    Alcohol/week: 3.0 standard drinks of alcohol    Types: 3 Glasses of wine per week    Comment: few week   Drug use: No     Allergies   Amlodipine and Sulfa antibiotics   Review of Systems Review of Systems  Constitutional:  Negative for fever.  HENT:  Positive for postnasal drip and sore throat.   All other systems reviewed and are negative.    Physical Exam Triage Vital Signs ED Triage Vitals  Encounter Vitals Group     BP 05/25/23 1502 133/83     Systolic BP Percentile --      Diastolic BP Percentile --      Pulse Rate 05/25/23 1502 79     Resp 05/25/23 1502 17     Temp 05/25/23 1502 98.8 F (37.1 C)     Temp Source 05/25/23 1502 Oral     SpO2 05/25/23 1502 99 %     Weight --      Height --      Head Circumference --      Peak Flow --      Pain Score 05/25/23 1501 0     Pain Loc --      Pain Education --      Exclude from Growth Chart --    No data found.  Updated Vital Signs BP 133/83 (BP Location: Left Arm)   Pulse 79   Temp 98.8 F (37.1 C) (Oral)   Resp 17   SpO2 99%   Visual Acuity Right Eye Distance:   Left Eye Distance:   Bilateral Distance:    Right Eye Near:   Left Eye Near:    Bilateral Near:      Physical Exam Vitals  and nursing note reviewed.  Constitutional:      General: He is not in acute distress.    Appearance: He is well-developed and well-groomed.  HENT:     Head: Normocephalic and atraumatic.     Right Ear: Tympanic membrane is retracted.     Left Ear: Tympanic membrane is retracted.     Nose: Congestion present.     Mouth/Throat:     Lips: Pink.     Mouth: Mucous membranes are moist.     Pharynx: Posterior oropharyngeal erythema and postnasal drip present.  Eyes:     Conjunctiva/sclera: Conjunctivae normal.  Cardiovascular:     Rate and Rhythm: Normal rate and regular rhythm.     Pulses: Normal pulses.     Heart sounds: Normal heart sounds. No murmur heard. Pulmonary:     Effort: Pulmonary effort is normal. No respiratory distress.     Breath sounds: Normal breath sounds and air entry.  Abdominal:     Palpations: Abdomen is soft.     Tenderness: There is no abdominal tenderness.  Musculoskeletal:        General: No swelling.     Cervical back: Neck supple.  Skin:    General: Skin is warm and dry.     Capillary Refill: Capillary refill takes less than 2 seconds.  Neurological:     General: No focal deficit present.     Mental Status: He is alert and oriented to person, place, and time.     GCS: GCS eye subscore is 4. GCS verbal subscore is 5. GCS motor subscore is 6.  Psychiatric:        Attention and Perception: Attention normal.        Mood and Affect: Mood normal.        Speech: Speech normal.        Behavior: Behavior normal. Behavior is cooperative.      UC Treatments / Results  Labs (all labs ordered are listed, but only abnormal results are displayed) Labs Reviewed  GROUP A STREP BY PCR    EKG   Radiology No results found.  Procedures Procedures (including critical care time)  Medications Ordered in UC Medications - No data to display  Initial Impression / Assessment and Plan / UC Course  I have reviewed the triage vital  signs and the nursing notes.  Pertinent labs & imaging results that were available during my care of the patient were reviewed by me and considered in my medical decision making (see chart for details).     Ddx: Viral pharyngitis, PND, allergies Final Clinical Impressions(s) / UC Diagnoses   Final diagnoses:  Viral pharyngitis  PND (post-nasal drip)     Discharge Instructions      Your strep test was negative. May take otc meds for symptoms managemetn(chloraseptic, claritin D, etc). Follow up with PCP.    ED Prescriptions   None    PDMP not reviewed this encounter.   Clancy Gourd, NP 05/25/23 [phone]

## 2023-05-25 NOTE — Discharge Instructions (Signed)
Your strep test was negative. May take otc meds for symptoms managemetn(chloraseptic, claritin D, etc). Follow up with PCP.

## 2023-06-23 ENCOUNTER — Other Ambulatory Visit: Payer: Self-pay | Admitting: Urology

## 2023-06-25 ENCOUNTER — Other Ambulatory Visit: Payer: Self-pay | Admitting: Family Medicine

## 2023-06-25 MED ORDER — METHYLPHENIDATE HCL ER (OSM) 54 MG PO TBCR: 54.0000 mg | EXTENDED_RELEASE_TABLET | ORAL | 0 refills | Status: DC

## 2023-06-25 NOTE — Telephone Encounter (Signed)
Requesting: methylphenidate 54mg  PO CR tablet Contract: 05/04/22 UDS: 05/17/23 Last Visit: 05/17/23 Next Visit: 11/15/23 Last Refill: 03/25/23 (90,0)  Please Advise. Med pending

## 2023-07-13 NOTE — Patient Instructions (Addendum)
SURGICAL WAITING ROOM VISITATION Patients having surgery or a procedure may have no more than 2 support people in the waiting area - these visitors may rotate.    Children under the age of 38 must have an adult with them who is not the patient.  If the patient needs to stay at the hospital during part of their recovery, the visitor guidelines for inpatient rooms apply. Pre-op nurse will coordinate an appropriate time for 1 support person to accompany patient in pre-op.  This support person may not rotate.    Please refer to the [facility] website for the visitor guidelines for Inpatients (after your surgery is over and you are in a regular room).       Your procedure is scheduled on: 07-27-23   Report to [facility] Main Entrance    Report to admitting at 5:15 AM   Call this number if you have problems the morning of surgery [phone]   Do not eat food or drink liquids :After Midnight.           If you have questions, please contact your surgeon's office.   FOLLOW  ANY ADDITIONAL PRE OP INSTRUCTIONS YOU RECEIVED FROM YOUR SURGEON'S OFFICE!!!     Oral Hygiene is also important to reduce your risk of infection.                                    Remember - BRUSH YOUR TEETH THE MORNING OF SURGERY WITH YOUR REGULAR TOOTHPASTE   Do NOT smoke after Midnight   Take these medicines the morning of surgery with A SIP OF WATER:   Omeprazole  Tylenol if needed  Stop all vitamins and herbal supplements 7 days before surgery                              You may not have any metal on your body including jewelry, and body piercing             Do not wear lotions, powders, cologne, or deodorant              Men may shave face and neck.   Do not bring valuables to the hospital. [facility] IS NOT RESPONSIBLE   FOR VALUABLES.   Contacts, dentures or bridgework may not be worn into surgery.  DO NOT BRING YOUR HOME MEDICATIONS TO THE HOSPITAL. PHARMACY WILL DISPENSE  MEDICATIONS LISTED ON YOUR MEDICATION LIST TO YOU DURING YOUR ADMISSION IN THE HOSPITAL!    Patients discharged on the day of surgery will not be allowed to drive home.  Someone NEEDS to stay with you for the first 24 hours after anesthesia.   Special Instructions: Bring a copy of your healthcare power of attorney and living will documents the day of surgery if you haven't scanned them before.              Please read over the following fact sheets you were given: IF YOU HAVE QUESTIONS ABOUT YOUR PRE-OP INSTRUCTIONS PLEASE CALL [phone] [facility]  If you received a COVID test during your pre-op visit  it is requested that you wear a mask when out in public, stay away from anyone that may not be feeling well and notify your surgeon if you develop symptoms. If you test positive for Covid or have been in contact with anyone that has  tested positive in the last 10 days please notify you surgeon.  [facility] - Preparing for Surgery Before surgery, you can play an important role.  Because skin is not sterile, your skin needs to be as free of germs as possible.  You can reduce the number of germs on your skin by washing with CHG (chlorahexidine gluconate) soap before surgery.  CHG is an antiseptic cleaner which kills germs and bonds with the skin to continue killing germs even after washing. Please DO NOT use if you have an allergy to CHG or antibacterial soaps.  If your skin becomes reddened/irritated stop using the CHG and inform your nurse when you arrive at Short Stay. Do not shave (including legs and underarms) for at least 48 hours prior to the first CHG shower.  You may shave your face/neck.  Please follow these instructions carefully:  1.  Shower with CHG Soap the night before surgery and the  morning of surgery.  2.  If you choose to wash your hair, wash your hair first as usual with your normal  shampoo.  3.  After you shampoo, rinse your hair and body thoroughly to remove the shampoo.                              4.  Use CHG as you would any other liquid soap.  You can apply chg directly to the skin and wash.  Gently with a scrungie or clean washcloth.  5.  Apply the CHG Soap to your body ONLY FROM THE NECK DOWN.   Do   not use on face/ open                           Wound or open sores. Avoid contact with eyes, ears mouth and   genitals (private parts).                       Wash face,  Genitals (private parts) with your normal soap.             6.  Wash thoroughly, paying special attention to the area where your    surgery  will be performed.  7.  Thoroughly rinse your body with warm water from the neck down.  8.  DO NOT shower/wash with your normal soap after using and rinsing off the CHG Soap.                9.  Pat yourself dry with a clean towel.            10.  Wear clean pajamas.            11.  Place clean sheets on your bed the night of your first shower and do not  sleep with pets. Day of Surgery : Do not apply any lotions/deodorants the morning of surgery.  Please wear clean clothes to the hospital/surgery center.  FAILURE TO FOLLOW THESE INSTRUCTIONS MAY RESULT IN THE CANCELLATION OF YOUR SURGERY  PATIENT SIGNATURE_________________________________  NURSE SIGNATURE__________________________________  ________________________________________________________________________

## 2023-07-15 NOTE — Progress Notes (Addendum)
COVID Vaccine Completed:   Yes  Date of COVID positive in last 90 days:  No  PCP - [name], MD Cardiologist - N/A  Chest x-ray - N/A EKG - 07-16-23 Epic Stress Test - N/A ECHO - N/A Cardiac Cath - N/A Pacemaker/ICD device last checked: Spinal Cord Stimulator:N/A  Bowel Prep - N/A  Sleep Study - N/A CPAP -   Fasting Blood Sugar - N/A Checks Blood Sugar _____ times a day  Last dose of GLP1 agonist-  N/A GLP1 instructions:  Hold 7 days before surgery    Last dose of SGLT-2 inhibitors-  N/A SGLT-2 instructions:  Hold 3 days before surgery    Blood Thinner Instructions:  N/A Aspirin Instructions: Last Dose:  Activity level:  Can go up a flight of stairs and perform activities of daily living without stopping and without symptoms of chest pain or shortness of breath.  Anesthesia review: Stop Bang 6  Patient denies shortness of breath, fever, cough and chest pain at PAT appointment  Patient verbalized understanding of instructions that were given to them at the PAT appointment. Patient was also instructed that they will need to review over the PAT instructions again at home before surgery.

## 2023-07-16 ENCOUNTER — Other Ambulatory Visit: Payer: Self-pay

## 2023-07-16 ENCOUNTER — Encounter (HOSPITAL_COMMUNITY)
Admission: RE | Admit: 2023-07-16 | Discharge: 2023-07-16 | Disposition: A | Discharge status: Home / Self Care | Payer: BC Managed Care – PPO | Source: Ambulatory Visit | Attending: Urology | Admitting: Urology

## 2023-07-16 ENCOUNTER — Encounter (HOSPITAL_COMMUNITY): Payer: Self-pay

## 2023-07-16 VITALS — BP 155/83 | HR 72 | Temp 98.0°F | Resp 16 | Ht 72.0 in | Wt 271.8 lb

## 2023-07-16 DIAGNOSIS — Z01818 Encounter for other preprocedural examination: Secondary | ICD-10-CM | POA: Diagnosis present

## 2023-07-16 DIAGNOSIS — I10 Essential (primary) hypertension: Secondary | ICD-10-CM | POA: Diagnosis not present

## 2023-07-16 LAB — CBC
HCT: 42.9 % (ref 39.0–52.0)
Hemoglobin: 14.3 g/dL (ref 13.0–17.0)
MCH: 30 pg (ref 26.0–34.0)
MCHC: 33.3 g/dL (ref 30.0–36.0)
MCV: 90.1 fL (ref 80.0–100.0)
Platelets: 259 10*3/uL (ref 150–400)
RBC: 4.76 MIL/uL (ref 4.22–5.81)
RDW: 12.9 % (ref 11.5–15.5)
WBC: 7.1 10*3/uL (ref 4.0–10.5)
nRBC: 0 % (ref 0.0–0.2)

## 2023-07-16 LAB — BASIC METABOLIC PANEL
Anion gap: 7 (ref 5–15)
BUN: 19 mg/dL (ref 8–23)
CO2: 24 mmol/L (ref 22–32)
Calcium: 9.1 mg/dL (ref 8.9–10.3)
Chloride: 105 mmol/L (ref 98–111)
Creatinine, Ser: 0.73 mg/dL (ref 0.61–1.24)
GFR, Estimated: >60 mL/min (ref >60)
Glucose, Bld: 107 mg/dL — ABNORMAL HIGH (ref 70–99)
Potassium: 4.4 mmol/L (ref 3.5–5.1)
Sodium: 136 mmol/L (ref 135–145)

## 2023-07-16 NOTE — Progress Notes (Signed)
   07/16/23 0808  OBSTRUCTIVE SLEEP APNEA  Have you ever been diagnosed with sleep apnea through a sleep study? No  Do you snore loudly (loud enough to be heard through closed doors)?  1  Do you often feel tired, fatigued, or sleepy during the daytime (such as falling asleep during driving or talking to someone)? 0  Has anyone observed you stop breathing during your sleep? 0  Do you have, or are you being treated for high blood pressure? 1  BMI more than 35 kg/m2? 1  Age > 50 (1-yes) 1  Neck circumference greater than:Male 16 inches or larger, Male 17inches or larger? 1  Male Gender (Yes=1) 1  Obstructive Sleep Apnea Score 6  Score 5 or greater  Results sent to PCP

## 2023-08-06 NOTE — Patient Instructions (Addendum)
DUE TO COVID-19 ONLY TWO VISITORS  (aged 65 and older)  ARE ALLOWED TO COME WITH YOU AND STAY IN THE WAITING ROOM ONLY DURING PRE OP AND PROCEDURE.   **NO VISITORS ARE ALLOWED IN THE SHORT STAY AREA OR RECOVERY ROOM!!**  IF YOU WILL BE ADMITTED INTO THE HOSPITAL YOU ARE ALLOWED ONLY FOUR SUPPORT PEOPLE DURING VISITATION HOURS ONLY (7 AM -8PM)   The support person(s) must pass our screening, gel in and out, and wear a mask at all times, including in the patient's room. Patients must also wear a mask when staff or their support person are in the room. Visitors GUEST BADGE MUST BE WORN VISIBLY  One adult visitor may remain with you overnight and MUST be in the room by 8 P.M.     Your procedure is scheduled on: 08/20/23   Report to [facility] Main Entrance    Report to admitting at : 10:00 AM   Call this number if you have problems the morning of surgery [phone]   Do not eat food or drink liquids: After Midnight.          If you have questions, please contact your surgeon's office.  FOLLOW ANY ADDITIONAL PRE OP INSTRUCTIONS YOU RECEIVED FROM YOUR SURGEON'S OFFICE!!!   Oral Hygiene is also important to reduce your risk of infection.                                    Remember - BRUSH YOUR TEETH THE MORNING OF SURGERY WITH YOUR REGULAR TOOTHPASTE  DENTURES WILL BE REMOVED PRIOR TO SURGERY PLEASE DO NOT APPLY "Poly grip" OR ADHESIVES!!!   Do NOT smoke after Midnight   Take these medicines the morning of surgery with A SIP OF WATER: Omeprazole.Tylenol as needed.                              You may not have any metal on your body including hair pins, jewelry, and body piercing             Do not wear lotions, powders, perfumes/cologne, or deodorant              Men may shave face and neck.   Do not bring valuables to the hospital. [facility] IS NOT             RESPONSIBLE   FOR VALUABLES.   Contacts, glasses, or bridgework may not be worn into surgery.   Bring  small overnight bag day of surgery.   DO NOT BRING YOUR HOME MEDICATIONS TO THE HOSPITAL. PHARMACY WILL DISPENSE MEDICATIONS LISTED ON YOUR MEDICATION LIST TO YOU DURING YOUR ADMISSION IN THE HOSPITAL!    Patients discharged on the day of surgery will not be allowed to drive home.  Someone NEEDS to stay with you for the first 24 hours after anesthesia.   Special Instructions: Bring a copy of your healthcare power of attorney and living will documents         the day of surgery if you haven't scanned them before.              Please read over the following fact sheets you were given: IF YOU HAVE QUESTIONS ABOUT YOUR PRE-OP INSTRUCTIONS PLEASE CALL [phone]    [facility] - Preparing for Surgery Before surgery, you can play an important role.  Because skin is not sterile, your skin needs to be as free of germs as possible.  You can reduce the number of germs on your skin by washing with CHG (chlorahexidine gluconate) soap before surgery.  CHG is an antiseptic cleaner which kills germs and bonds with the skin to continue killing germs even after washing. Please DO NOT use if you have an allergy to CHG or antibacterial soaps.  If your skin becomes reddened/irritated stop using the CHG and inform your nurse when you arrive at Short Stay. Do not shave (including legs and underarms) for at least 48 hours prior to the first CHG shower.  You may shave your face/neck. Please follow these instructions carefully:  1.  Shower with CHG Soap the night before surgery and the  morning of Surgery.  2.  If you choose to wash your hair, wash your hair first as usual with your  normal  shampoo.  3.  After you shampoo, rinse your hair and body thoroughly to remove the  shampoo.                           4.  Use CHG as you would any other liquid soap.  You can apply chg directly  to the skin and wash                       Gently with a scrungie or clean washcloth.  5.  Apply the CHG Soap to your body ONLY FROM THE  NECK DOWN.   Do not use on face/ open                           Wound or open sores. Avoid contact with eyes, ears mouth and genitals (private parts).                       Wash face,  Genitals (private parts) with your normal soap.             6.  Wash thoroughly, paying special attention to the area where your surgery  will be performed.  7.  Thoroughly rinse your body with warm water from the neck down.  8.  DO NOT shower/wash with your normal soap after using and rinsing off  the CHG Soap.                9.  Pat yourself dry with a clean towel.            10.  Wear clean pajamas.            11.  Place clean sheets on your bed the night of your first shower and do not  sleep with pets. Day of Surgery : Do not apply any lotions/deodorants the morning of surgery.  Please wear clean clothes to the hospital/surgery center.  FAILURE TO FOLLOW THESE INSTRUCTIONS MAY RESULT IN THE CANCELLATION OF YOUR SURGERY PATIENT SIGNATURE_________________________________  NURSE SIGNATURE__________________________________  ________________________________________________________________________

## 2023-08-09 ENCOUNTER — Other Ambulatory Visit: Payer: Self-pay

## 2023-08-09 ENCOUNTER — Encounter (HOSPITAL_COMMUNITY): Payer: Self-pay

## 2023-08-09 ENCOUNTER — Encounter (HOSPITAL_COMMUNITY)
Admission: RE | Admit: 2023-08-09 | Discharge: 2023-08-09 | Disposition: A | Discharge status: Home / Self Care | Payer: BC Managed Care – PPO | Source: Ambulatory Visit | Attending: Urology | Admitting: Urology

## 2023-08-09 VITALS — BP 160/92 | HR 78 | Temp 98.4°F | Ht 72.0 in | Wt 261.0 lb

## 2023-08-09 DIAGNOSIS — Z01818 Encounter for other preprocedural examination: Secondary | ICD-10-CM

## 2023-08-09 DIAGNOSIS — Z01812 Encounter for preprocedural laboratory examination: Secondary | ICD-10-CM | POA: Diagnosis present

## 2023-08-09 HISTORY — DX: Pneumonia, unspecified organism: J18.9

## 2023-08-09 LAB — BASIC METABOLIC PANEL
Anion gap: 7 (ref 5–15)
BUN: 15 mg/dL (ref 8–23)
CO2: 25 mmol/L (ref 22–32)
Calcium: 9.2 mg/dL (ref 8.9–10.3)
Chloride: 105 mmol/L (ref 98–111)
Creatinine, Ser: 1.04 mg/dL (ref 0.61–1.24)
GFR, Estimated: >60 mL/min (ref >60)
Glucose, Bld: 100 mg/dL — ABNORMAL HIGH (ref 70–99)
Potassium: 4 mmol/L (ref 3.5–5.1)
Sodium: 137 mmol/L (ref 135–145)

## 2023-08-09 LAB — CBC
HCT: 44.2 % (ref 39.0–52.0)
Hemoglobin: 14.7 g/dL (ref 13.0–17.0)
MCH: 29.9 pg (ref 26.0–34.0)
MCHC: 33.3 g/dL (ref 30.0–36.0)
MCV: 90 fL (ref 80.0–100.0)
Platelets: 338 10*3/uL (ref 150–400)
RBC: 4.91 MIL/uL (ref 4.22–5.81)
RDW: 12.6 % (ref 11.5–15.5)
WBC: 8.7 10*3/uL (ref 4.0–10.5)
nRBC: 0 % (ref 0.0–0.2)

## 2023-08-09 NOTE — Progress Notes (Addendum)
For Anesthesia: PCP - [name], MD  Cardiologist - N/A   Bowel Prep reminder:  Chest x-ray - 08/03/23 EKG - 07/16/23 Stress Test -  ECHO -  Cardiac Cath -  Pacemaker/ICD device last checked: Pacemaker orders received: Device Rep notified:  Spinal Cord Stimulator:N/A   Sleep Study - N/A  CPAP -   Fasting Blood Sugar - N/A  Checks Blood Sugar _____ times a day Date and result of last Hgb A1c-  Last dose of GLP1 agonist- N/A  GLP1 instructions:   Last dose of SGLT-2 inhibitors- N/A  SGLT-2 instructions:   Blood Thinner Instructions:N/A  Aspirin Instructions: Last Dose:  Activity level: Can go up a flight of stairs and activities of daily living without stopping and without chest pain and/or shortness of breath   Able to exercise without chest pain and/or shortness of breath  Anesthesia review: Recent diagnose with PNA :last week of November,finish with azithromycin,almost done with amoxicillin.Pt. feels completely recover.  Patient denies shortness of breath, fever, cough and chest pain at PAT appointment   Patient verbalized understanding of instructions that were given to them at the PAT appointment. Patient was also instructed that they will need to review over the PAT instructions again at home before surgery.

## 2023-08-20 ENCOUNTER — Other Ambulatory Visit: Payer: Self-pay

## 2023-08-20 ENCOUNTER — Ambulatory Visit (HOSPITAL_COMMUNITY): Payer: BC Managed Care – PPO | Admitting: Physician Assistant

## 2023-08-20 ENCOUNTER — Encounter (HOSPITAL_COMMUNITY)
Admission: RE | Disposition: A | Discharge status: Home / Self Care | Payer: Self-pay | Source: Ambulatory Visit | Attending: Urology

## 2023-08-20 ENCOUNTER — Ambulatory Visit (HOSPITAL_COMMUNITY): Payer: BC Managed Care – PPO | Admitting: Anesthesiology

## 2023-08-20 ENCOUNTER — Observation Stay (HOSPITAL_COMMUNITY)
Admission: RE | Admit: 2023-08-20 | Discharge: 2023-08-21 | Disposition: A | Discharge status: Home / Self Care | Payer: BC Managed Care – PPO | Source: Ambulatory Visit | Attending: Urology | Admitting: Urology

## 2023-08-20 ENCOUNTER — Encounter (HOSPITAL_COMMUNITY): Payer: Self-pay | Admitting: Urology

## 2023-08-20 DIAGNOSIS — Z9079 Acquired absence of other genital organ(s): Secondary | ICD-10-CM

## 2023-08-20 DIAGNOSIS — Z8616 Personal history of COVID-19: Secondary | ICD-10-CM | POA: Diagnosis not present

## 2023-08-20 DIAGNOSIS — R3912 Poor urinary stream: Secondary | ICD-10-CM | POA: Diagnosis not present

## 2023-08-20 DIAGNOSIS — Z79899 Other long term (current) drug therapy: Secondary | ICD-10-CM | POA: Insufficient documentation

## 2023-08-20 DIAGNOSIS — I10 Essential (primary) hypertension: Secondary | ICD-10-CM | POA: Insufficient documentation

## 2023-08-20 DIAGNOSIS — N401 Enlarged prostate with lower urinary tract symptoms: Principal | ICD-10-CM | POA: Insufficient documentation

## 2023-08-20 DIAGNOSIS — Z96652 Presence of left artificial knee joint: Secondary | ICD-10-CM | POA: Insufficient documentation

## 2023-08-20 DIAGNOSIS — R350 Frequency of micturition: Secondary | ICD-10-CM | POA: Insufficient documentation

## 2023-08-20 DIAGNOSIS — N138 Other obstructive and reflux uropathy: Principal | ICD-10-CM | POA: Diagnosis present

## 2023-08-20 SURGERY — ABLATION, PROSTATE, TRANSURETHRAL, USING WATERJET
Anesthesia: General | Site: Prostate

## 2023-08-20 MED ORDER — FENTANYL CITRATE PF 50 MCG/ML IJ SOSY
PREFILLED_SYRINGE | INTRAMUSCULAR | Status: AC
  Filled 2023-08-20: qty 2

## 2023-08-20 MED ORDER — SODIUM CHLORIDE 0.9% FLUSH: 3.0000 mL | INTRAVENOUS | Status: DC | PRN

## 2023-08-20 MED ORDER — FENTANYL CITRATE (PF) 100 MCG/2ML IJ SOLN
INTRAMUSCULAR | Status: DC | PRN
  Administered 2023-08-20 (×2): 50 ug via INTRAVENOUS
  Administered 2023-08-20: 100 ug via INTRAVENOUS

## 2023-08-20 MED ORDER — SODIUM CHLORIDE 0.9 % IR SOLN
3000.0000 mL | Status: DC
  Administered 2023-08-20 – 2023-08-21 (×6): 3000 mL

## 2023-08-20 MED ORDER — OXYCODONE HCL 5 MG/5ML PO SOLN: 5.0000 mg | Freq: Once | ORAL | Status: AC | PRN

## 2023-08-20 MED ORDER — CHLORHEXIDINE GLUCONATE 0.12 % MT SOLN
15.0000 mL | Freq: Once | OROMUCOSAL | Status: AC
  Administered 2023-08-20: 15 mL via OROMUCOSAL

## 2023-08-20 MED ORDER — ONDANSETRON HCL 4 MG/2ML IJ SOLN
INTRAMUSCULAR | Status: DC | PRN
  Administered 2023-08-20: 4 mg via INTRAVENOUS

## 2023-08-20 MED ORDER — PROPOFOL 10 MG/ML IV BOLUS
INTRAVENOUS | Status: DC | PRN
  Administered 2023-08-20: 200 mg via INTRAVENOUS

## 2023-08-20 MED ORDER — PANTOPRAZOLE SODIUM 40 MG PO TBEC
40.0000 mg | DELAYED_RELEASE_TABLET | Freq: Every day | ORAL | Status: DC
  Administered 2023-08-21: 40 mg via ORAL
  Filled 2023-08-20: qty 1

## 2023-08-20 MED ORDER — OXYCODONE HCL 5 MG PO TABS: 5.0000 mg | ORAL_TABLET | ORAL | Status: DC | PRN

## 2023-08-20 MED ORDER — MEPERIDINE HCL 50 MG/ML IJ SOLN: 6.2500 mg | INTRAMUSCULAR | Status: DC | PRN

## 2023-08-20 MED ORDER — DIPHENHYDRAMINE HCL 50 MG/ML IJ SOLN: 12.5000 mg | Freq: Four times a day (QID) | INTRAMUSCULAR | Status: DC | PRN

## 2023-08-20 MED ORDER — ACETAMINOPHEN 500 MG PO TABS
1000.0000 mg | ORAL_TABLET | Freq: Once | ORAL | Status: AC
  Administered 2023-08-20: 1000 mg via ORAL
  Filled 2023-08-20: qty 2

## 2023-08-20 MED ORDER — 0.9 % SODIUM CHLORIDE (POUR BTL) OPTIME
TOPICAL | Status: DC | PRN
  Administered 2023-08-20: 1000 mL

## 2023-08-20 MED ORDER — SODIUM CHLORIDE 0.9 % IV SOLN: 250.0000 mL | INTRAVENOUS | Status: DC | PRN

## 2023-08-20 MED ORDER — LISINOPRIL 10 MG PO TABS
10.0000 mg | ORAL_TABLET | Freq: Every day | ORAL | Status: DC
  Administered 2023-08-20 – 2023-08-21 (×2): 10 mg via ORAL
  Filled 2023-08-20 (×2): qty 1

## 2023-08-20 MED ORDER — ONDANSETRON HCL 4 MG/2ML IJ SOLN: 4.0000 mg | INTRAMUSCULAR | Status: DC | PRN

## 2023-08-20 MED ORDER — MIDAZOLAM HCL 2 MG/2ML IJ SOLN: 0.5000 mg | Freq: Once | INTRAMUSCULAR | Status: DC | PRN

## 2023-08-20 MED ORDER — FENTANYL CITRATE PF 50 MCG/ML IJ SOSY
25.0000 ug | PREFILLED_SYRINGE | INTRAMUSCULAR | Status: DC | PRN
  Administered 2023-08-20: 50 ug via INTRAVENOUS

## 2023-08-20 MED ORDER — OXYCODONE HCL 5 MG PO TABS
ORAL_TABLET | ORAL | Status: AC
  Administered 2023-08-20: 5 mg via ORAL
  Filled 2023-08-20: qty 1

## 2023-08-20 MED ORDER — FENTANYL CITRATE (PF) 100 MCG/2ML IJ SOLN
INTRAMUSCULAR | Status: AC
  Filled 2023-08-20: qty 2

## 2023-08-20 MED ORDER — SUGAMMADEX SODIUM 200 MG/2ML IV SOLN
INTRAVENOUS | Status: DC | PRN
  Administered 2023-08-20: 200 mg via INTRAVENOUS

## 2023-08-20 MED ORDER — TAMSULOSIN HCL 0.4 MG PO CAPS: 0.4000 mg | ORAL_CAPSULE | Freq: Every day | ORAL | 0 refills | Status: AC

## 2023-08-20 MED ORDER — MIDAZOLAM HCL 5 MG/5ML IJ SOLN
INTRAMUSCULAR | Status: DC | PRN
  Administered 2023-08-20: 2 mg via INTRAVENOUS

## 2023-08-20 MED ORDER — SENNOSIDES-DOCUSATE SODIUM 8.6-50 MG PO TABS
2.0000 | ORAL_TABLET | Freq: Every day | ORAL | Status: DC
  Administered 2023-08-20: 2 via ORAL
  Filled 2023-08-20: qty 2

## 2023-08-20 MED ORDER — LIDOCAINE HCL (CARDIAC) PF 100 MG/5ML IV SOSY
PREFILLED_SYRINGE | INTRAVENOUS | Status: DC | PRN
  Administered 2023-08-20: 40 mg via INTRAVENOUS

## 2023-08-20 MED ORDER — SODIUM CHLORIDE 0.9% FLUSH
3.0000 mL | Freq: Two times a day (BID) | INTRAVENOUS | Status: DC
  Administered 2023-08-20 – 2023-08-21 (×2): 3 mL via INTRAVENOUS

## 2023-08-20 MED ORDER — KETOROLAC TROMETHAMINE 15 MG/ML IJ SOLN: INTRAMUSCULAR | Status: DC | PRN

## 2023-08-20 MED ORDER — SODIUM CHLORIDE 0.9 % IR SOLN
Status: DC | PRN
  Administered 2023-08-20 (×3): 3000 mL

## 2023-08-20 MED ORDER — LACTATED RINGERS IV SOLN: INTRAVENOUS | Status: DC

## 2023-08-20 MED ORDER — MIDAZOLAM HCL 2 MG/2ML IJ SOLN
INTRAMUSCULAR | Status: AC
Start: 2023-08-20 — End: ?
  Filled 2023-08-20: qty 2

## 2023-08-20 MED ORDER — STERILE WATER FOR IRRIGATION IR SOLN
Status: DC | PRN
  Administered 2023-08-20: 250 mL

## 2023-08-20 MED ORDER — DEXAMETHASONE SODIUM PHOSPHATE 10 MG/ML IJ SOLN
INTRAMUSCULAR | Status: DC | PRN
  Administered 2023-08-20: 8 mg via INTRAVENOUS

## 2023-08-20 MED ORDER — ROCURONIUM BROMIDE 100 MG/10ML IV SOLN
INTRAVENOUS | Status: DC | PRN
  Administered 2023-08-20: 60 mg via INTRAVENOUS
  Administered 2023-08-20: 10 mg via INTRAVENOUS

## 2023-08-20 MED ORDER — OXYCODONE HCL 5 MG PO TABS: 5.0000 mg | ORAL_TABLET | Freq: Once | ORAL | Status: AC | PRN

## 2023-08-20 MED ORDER — ACETAMINOPHEN 325 MG PO TABS
650.0000 mg | ORAL_TABLET | ORAL | Status: DC | PRN
Start: 2023-08-20 — End: 2023-08-21
  Administered 2023-08-20 – 2023-08-21 (×3): 650 mg via ORAL
  Filled 2023-08-20 (×3): qty 2

## 2023-08-20 MED ORDER — TRIPLE ANTIBIOTIC 3.5-400-5000 EX OINT: 1.0000 | TOPICAL_OINTMENT | Freq: Three times a day (TID) | CUTANEOUS | Status: DC | PRN

## 2023-08-20 MED ORDER — TRANEXAMIC ACID-NACL 1000-0.7 MG/100ML-% IV SOLN
1000.0000 mg | INTRAVENOUS | Status: AC
  Administered 2023-08-20: 1000 mg via INTRAVENOUS
  Filled 2023-08-20: qty 100

## 2023-08-20 MED ORDER — SODIUM CHLORIDE 0.9 % IV SOLN
2.0000 g | INTRAVENOUS | Status: AC
  Administered 2023-08-20: 2 g via INTRAVENOUS
  Filled 2023-08-20: qty 20

## 2023-08-20 MED ORDER — ORAL CARE MOUTH RINSE: 15.0000 mL | Freq: Once | OROMUCOSAL | Status: AC

## 2023-08-20 MED ORDER — NITROFURANTOIN MONOHYD MACRO 100 MG PO CAPS: 100.0000 mg | ORAL_CAPSULE | Freq: Every day | ORAL | 0 refills | Status: DC

## 2023-08-20 MED ORDER — DIPHENHYDRAMINE HCL 12.5 MG/5ML PO ELIX: 12.5000 mg | ORAL_SOLUTION | Freq: Four times a day (QID) | ORAL | Status: DC | PRN

## 2023-08-20 MED ORDER — MORPHINE SULFATE (PF) 2 MG/ML IV SOLN: 2.0000 mg | INTRAVENOUS | Status: DC | PRN

## 2023-08-20 SURGICAL SUPPLY — 28 items
BAG URINE DRAIN 2000ML AR STRL (UROLOGICAL SUPPLIES) ×1 IMPLANT
BAND RUBBER #18 3X1/16 STRL (MISCELLANEOUS) IMPLANT
CANISTER SUCT 3000ML PPV (MISCELLANEOUS) ×1 IMPLANT
CATH HEMA 3WAY 30CC 22FR COUDE (CATHETERS) IMPLANT
CATH HEMA 3WAY 30CC 24FR COUDE (CATHETERS) IMPLANT
COVER MAYO STAND STRL (DRAPES) ×1 IMPLANT
DRAPE FOOT SWITCH (DRAPES) ×1 IMPLANT
DRAPE SURG IRRIG POUCH 19X23 (DRAPES) IMPLANT
GEL ULTRASOUND 8.5O AQUASONIC (MISCELLANEOUS) ×1 IMPLANT
GLOVE SURG LX STRL 7.5 STRW (GLOVE) ×1 IMPLANT
GOWN STRL REUS W/ TWL XL LVL3 (GOWN DISPOSABLE) ×1 IMPLANT
HANDPIECE AQUABEAM (MISCELLANEOUS) ×1 IMPLANT
HOLDER FOLEY CATH W/STRAP (MISCELLANEOUS) IMPLANT
KIT TURNOVER KIT A (KITS) IMPLANT
LOOP CUT BIPOLAR 24F LRG (ELECTROSURGICAL) IMPLANT
MANIFOLD NEPTUNE II (INSTRUMENTS) ×1 IMPLANT
MAT ABSORB FLUID 56X50 GRAY (MISCELLANEOUS) ×1 IMPLANT
PACK CYSTO (CUSTOM PROCEDURE TRAY) ×1 IMPLANT
PACK DRAPE AQUABEAM (MISCELLANEOUS) ×1 IMPLANT
PAD PREP 24X48 CUFFED NSTRL (MISCELLANEOUS) ×1 IMPLANT
PIN SAFETY STERILE (MISCELLANEOUS) IMPLANT
SYR 30ML LL (SYRINGE) ×1 IMPLANT
SYR TOOMEY IRRIG 70ML (MISCELLANEOUS) ×1
SYRINGE TOOMEY IRRIG 70ML (MISCELLANEOUS) ×2 IMPLANT
TOWEL OR 17X26 10 PK STRL BLUE (TOWEL DISPOSABLE) ×1 IMPLANT
TUBING CONNECTING 10 (TUBING) ×2 IMPLANT
TUBING UROLOGY SET (TUBING) ×1 IMPLANT
UNDERPAD 30X36 HEAVY ABSORB (UNDERPADS AND DIAPERS) ×1 IMPLANT

## 2023-08-20 NOTE — Anesthesia Procedure Notes (Signed)
Procedure Name: Intubation Date/Time: 08/20/2023 12:57 PM  Performed by: Randa Evens, CRNAPre-anesthesia Checklist: Patient identified, Emergency Drugs available, Suction available and Patient being monitored Patient Re-evaluated:Patient Re-evaluated prior to induction Oxygen Delivery Method: Circle System Utilized Preoxygenation: Pre-oxygenation with 100% oxygen Induction Type: IV induction Ventilation: Mask ventilation without difficulty Laryngoscope Size: Glidescope and 4 Grade View: Grade I Tube type: Oral Tube size: 7.5 mm Number of attempts: 1 Airway Equipment and Method: Stylet and Oral airway Placement Confirmation: ETT inserted through vocal cords under direct vision, positive ETCO2 and breath sounds checked- equal and bilateral Secured at: 22 cm Tube secured with: Tape Dental Injury: Teeth and Oropharynx as per pre-operative assessment

## 2023-08-20 NOTE — Progress Notes (Signed)
Dr. Alvester Morin spoke to nurse in PACU and Carlos Ellis was transferred to the 4th floor for admission. It turns out, Carlos Ellis continued to have grade 3 hematuria on traction and a slow CBI and therefore he will be admitted overnight.  He was seen and examined on rounds this evening.  He is alert and oriented and looks well.  His abdomen is soft is nontender.  Foley catheter in place and still on a light rubber band traction.  A slow CBI is going and urine is grade 1-3. Flashes on and off. Some slight oozing around the catheter per meatus as expected.   Discussed plan with patient and wife.

## 2023-08-20 NOTE — Discharge Instructions (Signed)
Transurethral Robotic Waterjet Ablation of Prostate, Care After The following information offers guidance on how to care for yourself after your procedure. Your health care provider may also give you more specific instructions. If you have problems or questions, contact your health care provider. What can I expect after the procedure? After the procedure, it is common to have: Mild pain in your lower abdomen. Soreness or mild discomfort in your penis or when you urinate. This is from having the catheter inserted during the procedure. A sudden urge to urinate (urgency). A need to urinate often. A small amount of blood in your urine. You may notice some small blood clots in your urine. These are normal. Follow these instructions at home: Medicines Take over-the-counter and prescription medicines only as told by your health care provider. If you were prescribed an antibiotic medicine, take it as told by your health care provider. Do not stop taking the antibiotic even if you start to feel better. Activity  Rest as told by your health care provider. Avoid sitting for a long time without moving. Get up to take short walks every 1-2 hours. This is important to improve blood flow and breathing. Ask for help if you feel weak or unsteady. You may increase your physical activity gradually as you start to feel better. Do not drive or operate machinery until your health care provider says that it is safe. Do not ride in a car for long periods of time, or as told by your health care provider. Avoid intense physical activity for as long as told by your health care provider. Do not lift anything that is heavier than 10 lb (4.5 kg), or the limit that you are told, until your health care provider says that it is safe. Do not have sex until your health care provider approves. Return to your normal activities as told by your health care provider. Ask your health care provider what activities are safe for  you. Preventing constipation  You may need to take these actions to prevent or treat constipation: Drink enough fluid to keep your urine pale yellow. Take over-the-counter or prescription medicines. Eat foods that are high in fiber, such as beans, whole grains, and fresh fruits and vegetables. Limit foods that are high in fat and processed sugars, such as fried or sweet foods.   General instructions Do not strain when you have a bowel movement. Straining may lead to bleeding from the prostate. This may cause blood clots and trouble urinating. Do not use any products that contain nicotine or tobacco. These products include cigarettes, chewing tobacco, and vaping devices, such as e-cigarettes. If you need help quitting, ask your health care provider. If you go home with a tube draining your urine (urinary catheter), care for the catheter as told by your health care provider. Wear compression stockings as told by your health care provider. These stockings help to prevent blood clots and reduce swelling in your legs. Keep all follow-up visits. This is important. Contact a health care provider if: You have signs of infection, such as: Fever or chills. Urine that smells very bad. Swelling around your urethra that is getting worse. Swelling in your penis or testicles. You have difficulty urinating. You have pain that gets worse or does not improve with medicine. You have blood in your urine that does not go away after 1 week of resting and drinking more fluids. You have trouble having a bowel movement. You have trouble having or keeping an erection. No semen  comes out during orgasm (dry ejaculation). You have a urinary catheter in place, and you have: Spasms or pain. Problems with your catheter or your catheter is blocked. Get help right away if: You are unable to urinate. You are having more blood clots in your urine instead of fewer. You have: Large blood clots. A lot of blood in your  urine. Pain in your back or lower abdomen. You have difficulty breathing or shortness of breath. You develop swelling or pain in your leg. These symptoms may be an emergency. Get help right away. Call 911. Do not wait to see if the symptoms will go away. Do not drive yourself to the hospital. Summary After the procedure, it is common to have a small amount of blood in your urine. Follow restrictions about lifting and sexual activity as told by your health care provider. Ask what activities are safe for you. Keep all follow-up visits. This is important. This information is not intended to replace advice given to you by your health care provider. Make sure you discuss any questions you have with your health care provider. Document Revised: 05/06/2021 Document Reviewed: 05/06/2021 Elsevier Patient Education  2024 [employer].

## 2023-08-20 NOTE — Anesthesia Postprocedure Evaluation (Signed)
Anesthesia Post Note  Patient: Carlos Ellis  Procedure(s) Performed: TRANSURETHRAL WATERJET ABLATION OF PROSTATE (Prostate)     Patient location during evaluation: PACU Anesthesia Type: General Level of consciousness: awake and alert, patient cooperative and oriented Pain management: pain level controlled Vital Signs Assessment: post-procedure vital signs reviewed and stable Respiratory status: spontaneous breathing, nonlabored ventilation and respiratory function stable Cardiovascular status: blood pressure returned to baseline and stable Postop Assessment: no apparent nausea or vomiting Anesthetic complications: no   No notable events documented.  Last Vitals:  Vitals:   08/20/23 1417 08/20/23 1430  BP: (!) 146/82 132/79  Pulse: 76 66  Resp: 13 14  Temp:    SpO2: 96% 96%    Last Pain:  Vitals:   08/20/23 1430  TempSrc:   PainSc: 7                  Reyes Fifield,E. Jalen Oberry

## 2023-08-20 NOTE — Anesthesia Preprocedure Evaluation (Addendum)
Anesthesia Evaluation  Patient identified by MRN, date of birth, ID band Patient awake    Reviewed: Allergy & Precautions, NPO status , Patient's Chart, lab work & pertinent test results  History of Anesthesia Complications Negative for: history of anesthetic complications  Airway Mallampati: II  TM Distance: >3 FB Neck ROM: Full    Dental  (+) Dental Advisory Given, Teeth Intact   Pulmonary neg pulmonary ROS   breath sounds clear to auscultation       Cardiovascular hypertension, Pt. on medications (-) angina  Rhythm:Regular Rate:Normal     Neuro/Psych    Depression    negative neurological ROS     GI/Hepatic Neg liver ROS,GERD  Medicated and Controlled,,  Endo/Other  BMI 35  Renal/GU H/o stones     Musculoskeletal  (+) Arthritis ,    Abdominal   Peds  Hematology negative hematology ROS (+)   Anesthesia Other Findings   Reproductive/Obstetrics                             Anesthesia Physical Anesthesia Plan  ASA: 2  Anesthesia Plan: General   Post-op Pain Management: Tylenol PO (pre-op)* and Toradol IV (intra-op)*   Induction: Intravenous  PONV Risk Score and Plan: 2 and Ondansetron and Dexamethasone  Airway Management Planned: LMA  Additional Equipment: None  Intra-op Plan:   Post-operative Plan:   Informed Consent: I have reviewed the patients History and Physical, chart, labs and discussed the procedure including the risks, benefits and alternatives for the proposed anesthesia with the patient or authorized representative who has indicated his/her understanding and acceptance.     Dental advisory given  Plan Discussed with: CRNA and Surgeon  Anesthesia Plan Comments:         Anesthesia Quick Evaluation

## 2023-08-20 NOTE — Transfer of Care (Signed)
Immediate Anesthesia Transfer of Care Note  Patient: Carlos Ellis  Procedure(s) Performed: TRANSURETHRAL WATERJET ABLATION OF PROSTATE (Prostate)  Patient Location: PACU  Anesthesia Type:General  Level of Consciousness: drowsy and patient cooperative  Airway & Oxygen Therapy: Patient Spontanous Breathing  Post-op Assessment: Report given to RN  Post vital signs: Reviewed and stable  Last Vitals:  Vitals Value Taken Time  BP 146/82 08/20/23 1417  Temp    Pulse 71 08/20/23 1420  Resp 12 08/20/23 1420  SpO2 95 % 08/20/23 1420  Vitals shown include unfiled device data.  Last Pain:  Vitals:   08/20/23 1033  TempSrc:   PainSc: 0-No pain         Complications: No notable events documented.

## 2023-08-20 NOTE — H&P (Signed)
H&P  Chief Complaint: BPH, weak stream  History of Present Illness: Carlos Ellis is a 67 year old male with a history of BPH.  He has been treated with tamsulosin and finasteride.  He underwent an prostatic urethral lift and a laser cystolitholopaxy in 2022.  At that point he was able to stop his medicines.  He does have a history of PSA elevation and in November 2023 prostate MRI was benign with 110 g prostate.  He has developed increase in lower urinary tract symptoms with a weak stream.  He restarted finasteride.  Cystoscopy August 2024 revealed continued lateral and median lobe hypertrophy with obstruction.  He presents today for robotic water jet ablation of the prostate.  He has been well without dysuria or gross hematuria.  No congestion but he's had some mild "post nasal drip". No fever.   Past Medical History:  Diagnosis Date   Acne    Dr. Katrinka Blazing (was on accutane at one point)   Adult ADHD 11/2017   BPH with obstruction/lower urinary tract symptoms 2015   finasteride trial 09/2015. Tamsulosin added 05/2019 urol->Surgical options discussed at that time.   Condyloma acuminata 07/2019   responded well to imiquimod   COVID 08/2019   high fever lethargic weakness x 5 days all symptoms resolved   Diverticulosis 08/2019   Noted on screening colonoscopy   Elevated PSA 2015   Bx benign 12/2014.  04/2016 prostate MRI negative. PSA stable at 4.29 as of 02/2018.  PSA doubled 08/2018-->13.2 02/2019->rpt bx benign. PSAs followed by Janetta Hora.   Episcleritis of right eye    w/mild scleritis right eye ([facility] opht 2011); also with hx of ocular varicella at age 71.   GERD (gastroesophageal reflux disease)    Tums   History of kidney stones 25 yrs ago   Hypercholesterolemia    frhm risk= 14%   Hypertension    Hypogonadism male 2009   Nephrolithiasis    Osteoarthritis of left knee    Dr. Turner Daniels   Pneumonia    Past Surgical History:  Procedure Laterality Date   COLONOSCOPY  09/22/2019   X 2, normal (for  strong FH of colon cancer.  Last in approx 2010 was normal.  Rpt 08/2019 NO POLYPS->repeat 10 yrs   CYSTOSCOPY WITH INSERTION OF UROLIFT  11/01/2020   Procedure: CYSTOSCOPY WITH INSERTION OF UROLIFT;  Surgeon: [name], MD;  Location: [facility];  Service: Urology;;   CYSTOSCOPY WITH LITHOLAPAXY N/A 11/01/2020   Procedure: CYSTOSCOPY WITH LITHOLAPAXY;  Surgeon: [name], MD;  Location: [facility];  Service: Urology;  Laterality: N/A;   CYSTOSCOPY WITH LITHOLAPAXY N/A 05/18/2023   Procedure: CYSTOSCOPY WITH LITHOLAPAXY;  Surgeon: [name], MD;  Location: [facility];  Service: Urology;  Laterality: N/A;   FRACTURE SURGERY Left 1974   wrist    KNEE ARTHROSCOPY     Left: 1990s and early 2000's.   LITHOTRIPSY     1990s   PROSTATE BIOPSY  12/2014; 02/2019   2016 benign.  02/2019 benign.   ROTATOR CUFF REPAIR Right 2004   Right   THROAT SURGERY     Infected branchial cleft cyst 2007   TOTAL KNEE ARTHROPLASTY Left 03/10/2016   Procedure: TOTAL KNEE ARTHROPLASTY;  Surgeon: Marcene Corning, MD;  Location: [facility];  Service: Orthopedics;  Laterality: Left;    Home Medications:  Medications Prior to Admission  Medication Sig Dispense Refill Last Dose/Taking   acetaminophen (TYLENOL) 500 MG tablet Take 1,000 mg by mouth every  6 (six) hours as needed for moderate pain (pain score 4-6).   Taking As Needed   lisinopril (ZESTRIL) 10 MG tablet Take 1 tablet (10 mg total) by mouth daily. 90 tablet 3 Taking   methylphenidate (RITALIN) 10 MG tablet Take 10 mg by mouth daily as needed (extra focus).   Taking As Needed   methylphenidate 54 MG PO CR tablet Take 1 tablet (54 mg total) by mouth every morning. 90 tablet 0 Taking   naproxen (NAPROSYN) 500 MG tablet Take 500 mg by mouth daily.   Taking   omeprazole (PRILOSEC) 20 MG capsule Take 20 mg by mouth daily.   Taking   amoxicillin-clavulanate (AUGMENTIN) 875-125 MG tablet Take 1 tablet by  mouth 2 (two) times daily.      azithromycin (ZITHROMAX) 250 MG tablet Take 250 mg by mouth daily.      Allergies:  Allergies  Allergen Reactions   Amlodipine Other (See Comments)    disequilibrium   Sulfa Antibiotics Rash    Family History  Problem Relation Age of Onset   Cancer Paternal Grandfather        either prostate, colon or something in that area - not definitive dx of cancer    Multiple myeloma Brother    Colon cancer Neg Hx    Colon polyps Neg Hx    Esophageal cancer Neg Hx    Rectal cancer Neg Hx    Stomach cancer Neg Hx    Social History:  reports that he has never smoked. He has never used smokeless tobacco. He reports current alcohol use of about 3.0 standard drinks of alcohol per week. He reports that he does not use drugs.  ROS: A complete review of systems was performed.  All systems are negative except for pertinent findings as noted. ROS   Physical Exam:  Vital signs in last 24 hours: Temp:  [97.8 F (36.6 C)] 97.8 F (36.6 C) (12/27 1024) Pulse Rate:  [70] 70 (12/27 1024) Resp:  [18] 18 (12/27 1024) BP: (156)/(87) 156/87 (12/27 1024) SpO2:  [97 %] 97 % (12/27 1024) General:  Alert and oriented, No acute distress HEENT: Normocephalic, atraumatic Cardiovascular: Regular rate and rhythm Lungs: Regular rate and effort Abdomen: Soft, nontender, nondistended, no abdominal masses Back: No CVA tenderness Extremities: No edema Neurologic: Grossly intact  Laboratory Data:  No results found for this or any previous visit (from the past 24 hours). No results found for this or any previous visit (from the past 240 hours). Creatinine: No results for input(s): "CREATININE" in the last 168 hours.  Impression/Assessment:  BPH, weak stream-  Plan:  I discussed with the patient the nature, potential benefits, risks and alternatives to , including side effects of the proposed treatment, the likelihood of the patient achieving the goals of the procedure, and  any potential problems that might occur during the procedure or recuperation.  Again discussed expectations for flow symptoms and storage symptoms.  Also possible need for staged procedure.  All questions answered. Patient elects to proceed.    [name] 08/20/2023

## 2023-08-20 NOTE — Op Note (Signed)
Preoperative diagnosis: BPH with lower urinary tract symptoms, weak stream, frequency  Postoperative diagnosis: Same   Procedure: Robotic water jet ablation of the prostate   Surgeon: Sobia Karger   Anesthesia: General   Indication for procedure:  67 year old male with a history of BPH and lower urinary tract symptoms.   Findings:  EUA revealed normal glans and meatus.  No lesion.  On DRE prostate was about 50 g and smooth without hard area or nodule.  Cystoscopy revealed obstruction from lateral lobe hypertrophy with a high bladder neck.  Long prostatic urethra.  Description of procedure:  He was brought to the operating room and placed supine on the operating table.  After adequate anesthesia he was placed lithotomy position. Timeout was performed to confirm the patient and procedure. The TRUS Stepper was mounted to the Articulating Arm and secured to OR bed. The ultrasound probe was attached to the stepper. Exam under anesthesia was performed and the TRUS was inserted per rectum.  There was no resistance. The ultrasound probe was aligned, and confirmation made that the prostate is centered and aligned using both transverse and sagittal views. The bladder neck, verumontanum and the central/transition zones were identified.  Genitalia were prepped and draped in the usual sterile fashion. The 31F AQUABEAM Handpiece is inserted into the prostatic urethra and a complete cystoscopic evaluation was performed by inspecting the prostate, bladder, and identifying the location of the verumontanum/external sphincter. The AQUABEAM Handpiece was secured to the Handpiece Articulating Arm. Confirmed alignment of AQUABEAM Handpiece and TRUS Probe to be parallel and colinear. Confirmation that AQUABEAM nozzle is centered and anterior of the bladder neck or the median lobe. The cystoscope was then retracted to visualize the verumontanum and external sphincter and the cystoscope tip was positioned just proximal to the  external sphincter. Reconfirmed alignment of the TRUS probe with the AQUABEAM Handpiece and compression applied with TRUS probe. Horizontal alignment of the Handpiece waterjet nozzle was performed. The Aquablation treatment zones were planned utilizing real-time TRUS to visualize the contour of the prostate and the depth and radial angles of resection were defined in the transverse view. In the sagittal view, the AQUABEAM nozzle is identified and position registered with software. The treatment contours were then adjusted to conform to the intended resection margins. The median lobe, bladder neck and verumontanum were marked and confirmed in the treatment contour. The Aquablation Treatment was then started following the resection contour confirmed under ultrasound guidance.  TOTAL AQUABLATION RESECTION TIME: 4 min, 30 sec x 2 for 9 min total  Once Aquablation resection was complete the 24 French aqua beam handpiece was carefully removed.  The continuous-flow sheath with the visual obturator was passed and then the loop and handle.  The trigone and the ureteral orifices were identified.  Resection of some of the residual bladder neck tissue was done.  The bladder neck was identified at 6:00 and this was taken up to 12:00 with fulguration of the bladder neck and prostate for hemostasis.  Slight amount of anterior tissue was resected.  Similarly from 6:00 up to 12:00 on the left side of the bladder neck was identified by resecting some of the ablated tissue to identify the bladder neck and cauterize any bleeding.  Some anterior tissue on the left was resected.  This created excellent hemostasis.  All the chips were evacuated.  Ureteral orifices again identified and noted to be normal without injury.  The scope was backed out and a 22 [place] hematuria catheter was placed with 30  cc in the balloon.  The balloon was seated at the bladder neck and it was irrigated on light traction and noted to be clear to pink.  He  was hooked up to CBI.  He was cleaned up and placed supine.  Catheter was placed on traction.  He was awakened and taken to the cover room in stable condition.  Complications: None  Blood loss: 50 mL  Specimens: None  Drains: 22 French three-way hematuria catheter with 30 cc in the balloon  Disposition: Patient stable to PACU

## 2023-08-21 DIAGNOSIS — N401 Enlarged prostate with lower urinary tract symptoms: Secondary | ICD-10-CM | POA: Diagnosis not present

## 2023-08-21 MED ORDER — POLYETHYLENE GLYCOL 3350 17 G PO PACK: 17.0000 g | PACK | Freq: Every day | ORAL | 0 refills | Status: AC

## 2023-08-21 MED ORDER — CHLORHEXIDINE GLUCONATE CLOTH 2 % EX PADS: 6.0000 | MEDICATED_PAD | Freq: Every day | CUTANEOUS | Status: DC

## 2023-08-21 NOTE — Progress Notes (Signed)
AVS reviewed w/ pt and s/o - foley teaching done- additional leg bag and drainage bag given to pt. Pt verbalized an understanding- PIV removed as noted - pt dressing for d/c to home

## 2023-08-21 NOTE — Plan of Care (Signed)

## 2023-08-21 NOTE — Plan of Care (Signed)
  Problem: Education: Goal: Knowledge of General Education information will improve Description: Including pain rating scale, medication(s)/side effects and non-pharmacologic comfort measures Outcome: Completed/Met   Problem: Health Behavior/Discharge Planning: Goal: Ability to manage health-related needs will improve Outcome: Completed/Met   Problem: Clinical Measurements: Goal: Ability to maintain clinical measurements within normal limits will improve Outcome: Completed/Met Goal: Will remain free from infection Outcome: Completed/Met Goal: Diagnostic test results will improve Outcome: Completed/Met Goal: Respiratory complications will improve Outcome: Completed/Met Goal: Cardiovascular complication will be avoided Outcome: Completed/Met   Problem: Activity: Goal: Risk for activity intolerance will decrease Outcome: Completed/Met   Problem: Nutrition: Goal: Adequate nutrition will be maintained Outcome: Completed/Met   Problem: Coping: Goal: Level of anxiety will decrease Outcome: Completed/Met   Problem: Elimination: Goal: Will not experience complications related to bowel motility Outcome: Completed/Met Goal: Will not experience complications related to urinary retention Outcome: Completed/Met   Problem: Pain Management: Goal: General experience of comfort will improve Outcome: Completed/Met   Problem: Safety: Goal: Ability to remain free from injury will improve Outcome: Completed/Met   Problem: Skin Integrity: Goal: Risk for impaired skin integrity will decrease Outcome: Completed/Met   Problem: Education: Goal: Knowledge of the prescribed therapeutic regimen will improve Outcome: Completed/Met   Problem: Bowel/Gastric: Goal: Gastrointestinal status for postoperative course will improve Outcome: Completed/Met   Problem: Health Behavior/Discharge Planning: Goal: Identification of resources available to assist in meeting health care needs will  improve Outcome: Completed/Met   Problem: Skin Integrity: Goal: Demonstration of wound healing without infection will improve Outcome: Completed/Met   Problem: Urinary Elimination: Goal: Ability to avoid or minimize complications of infection will improve Outcome: Completed/Met

## 2023-08-21 NOTE — Progress Notes (Signed)
Pt had concerns about the drainage at the foley site. Bloody drainage noted - foley drainage red w/o clots - draining well. Foley/peri care done by pt. Foley emptied for 400cc. Pt dressing for d/c to home

## 2023-08-21 NOTE — Discharge Summary (Signed)
Date of admission: 08/20/2023  Date of discharge: 08/21/2023  Admission diagnosis: BPH  Discharge diagnosis: BPH  Secondary diagnoses:  Patient Active Problem List   Diagnosis Date Noted   S/P transurethral resection of prostate 08/20/2023   BPH with obstruction/lower urinary tract symptoms 08/20/2023   Viral pharyngitis 05/25/2023   PND (post-nasal drip) 05/25/2023   Hamstring strain, right, initial encounter 08/19/2020   Primary osteoarthritis of left knee 03/10/2016   Snoring 09/27/2015   Hypersomnia 09/27/2015   Obesity 09/27/2015   Health maintenance examination 02/14/2012   Osteoarthritis    Depression    GERD (gastroesophageal reflux disease)    Nephrolithiasis    Episcleritis of right eye    Gout    Acne    Bronchitis 10/06/2011   Hypogonadism, male 05/18/2011   Borderline hyperlipidemia     Procedures performed: Procedure(s): TRANSURETHRAL WATERJET ABLATION OF PROSTATE  History and Physical: For full details, please see admission history and physical. Briefly, Carlos Ellis is a 67 y.o. year old patient with BPH s/p Aquablation with Dr. [name].Marland Kitchen   Hospital Course: Patient tolerated the procedure well.  He was then transferred to the floor after an uneventful PACU stay.  His hospital course was uncomplicated.  On POD#1 he had met discharge criteria: was eating a regular diet, was up and ambulating independently,  pain was well controlled, catheter was in place with coolaid colored urine.   Physical exam:  Gen: NAD GU: 3 way catheter in place with coolaid colored urine no clots draining well.   Laboratory values:  No results for input(s): "WBC", "HGB", "HCT" in the last 72 hours. No results for input(s): "NA", "K", "CL", "CO2", "GLUCOSE", "BUN", "CREATININE", "CALCIUM" in the last 72 hours. No results for input(s): "LABPT", "INR" in the last 72 hours. No results for input(s): "LABURIN" in the last 72 hours. Results for orders placed or performed during  the hospital encounter of 05/25/23  Group A Strep by PCR     Status: None   Collection Time: 05/25/23  3:05 PM   Specimen: Throat; Sterile Swab  Result Value Ref Range Status   Group A Strep by PCR NOT DETECTED NOT DETECTED Final    Comment: Performed at [facility], [address], [place], [place] [address]    Disposition: Home  Discharge instruction: The patient was instructed to be ambulatory but told to refrain from heavy lifting, strenuous activity, or driving.   Discharge medications:  Allergies as of 08/21/2023       Reactions   Amlodipine Other (See Comments)   disequilibrium   Sulfa Antibiotics Rash        Medication List     TAKE these medications    acetaminophen 500 MG tablet Commonly known as: TYLENOL Take 1,000 mg by mouth every 6 (six) hours as needed for moderate pain (pain score 4-6).   amoxicillin-clavulanate 875-125 MG tablet Commonly known as: AUGMENTIN Take 1 tablet by mouth 2 (two) times daily.   azithromycin 250 MG tablet Commonly known as: ZITHROMAX Take 250 mg by mouth daily.   lisinopril 10 MG tablet Commonly known as: ZESTRIL Take 1 tablet (10 mg total) by mouth daily.   methylphenidate 10 MG tablet Commonly known as: RITALIN Take 10 mg by mouth daily as needed (extra focus).   methylphenidate 54 MG CR tablet Commonly known as: CONCERTA Take 1 tablet (54 mg total) by mouth every morning.   naproxen 500 MG tablet Commonly known as: NAPROSYN Take 500 mg by  mouth daily.   nitrofurantoin (macrocrystal-monohydrate) 100 MG capsule Commonly known as: MACROBID Take 1 capsule (100 mg total) by mouth at bedtime.   omeprazole 20 MG capsule Commonly known as: PRILOSEC Take 20 mg by mouth daily.   polyethylene glycol 17 g packet Commonly known as: MiraLax Take 17 g by mouth daily.   tamsulosin 0.4 MG Caps capsule Commonly known as: FLOMAX Take 1 capsule (0.4 mg total) by mouth daily after supper.         Followup:   Follow-up Information     [name], MD Follow up on 08/27/2023.   Specialty: Urology Why: at 8:30 AM with NP Hollice Espy for catheter removal Contact information: [address] [place] [address] [phone]

## 2023-09-23 ENCOUNTER — Encounter (HOSPITAL_COMMUNITY): Payer: Self-pay | Admitting: Urology

## 2023-09-23 ENCOUNTER — Inpatient Hospital Stay (HOSPITAL_COMMUNITY): Payer: BC Managed Care – PPO | Admitting: Anesthesiology

## 2023-09-23 ENCOUNTER — Ambulatory Visit (HOSPITAL_COMMUNITY)
Admission: AD | Admit: 2023-09-23 | Discharge: 2023-09-23 | Disposition: A | Discharge status: Home / Self Care | Payer: BC Managed Care – PPO | Source: Ambulatory Visit | Attending: Urology | Admitting: Urology

## 2023-09-23 ENCOUNTER — Other Ambulatory Visit: Payer: Self-pay

## 2023-09-23 ENCOUNTER — Other Ambulatory Visit: Payer: Self-pay | Admitting: Urology

## 2023-09-23 ENCOUNTER — Encounter (HOSPITAL_COMMUNITY)
Admission: AD | Disposition: A | Discharge status: Home / Self Care | Payer: Self-pay | Source: Ambulatory Visit | Attending: Urology

## 2023-09-23 DIAGNOSIS — N3289 Other specified disorders of bladder: Secondary | ICD-10-CM | POA: Insufficient documentation

## 2023-09-23 DIAGNOSIS — K219 Gastro-esophageal reflux disease without esophagitis: Secondary | ICD-10-CM | POA: Diagnosis not present

## 2023-09-23 DIAGNOSIS — N401 Enlarged prostate with lower urinary tract symptoms: Secondary | ICD-10-CM | POA: Insufficient documentation

## 2023-09-23 DIAGNOSIS — R310 Gross hematuria: Secondary | ICD-10-CM | POA: Diagnosis present

## 2023-09-23 DIAGNOSIS — Z79899 Other long term (current) drug therapy: Secondary | ICD-10-CM | POA: Diagnosis not present

## 2023-09-23 DIAGNOSIS — I10 Essential (primary) hypertension: Secondary | ICD-10-CM | POA: Diagnosis not present

## 2023-09-23 DIAGNOSIS — Z01818 Encounter for other preprocedural examination: Principal | ICD-10-CM

## 2023-09-23 HISTORY — PX: CYSTOSCOPY WITH FULGERATION: SHX6638

## 2023-09-23 LAB — BASIC METABOLIC PANEL
Anion gap: 11 (ref 5–15)
BUN: 19 mg/dL (ref 8–23)
CO2: 22 mmol/L (ref 22–32)
Calcium: 9.4 mg/dL (ref 8.9–10.3)
Chloride: 106 mmol/L (ref 98–111)
Creatinine, Ser: 1.11 mg/dL (ref 0.61–1.24)
GFR, Estimated: >60 mL/min (ref >60)
Glucose, Bld: 98 mg/dL (ref 70–99)
Potassium: 4.3 mmol/L (ref 3.5–5.1)
Sodium: 139 mmol/L (ref 135–145)

## 2023-09-23 LAB — CBC
HCT: 42.7 % (ref 39.0–52.0)
Hemoglobin: 13.9 g/dL (ref 13.0–17.0)
MCH: 28.8 pg (ref 26.0–34.0)
MCHC: 32.6 g/dL (ref 30.0–36.0)
MCV: 88.4 fL (ref 80.0–100.0)
Platelets: 333 10*3/uL (ref 150–400)
RBC: 4.83 MIL/uL (ref 4.22–5.81)
RDW: 13 % (ref 11.5–15.5)
WBC: 9.7 10*3/uL (ref 4.0–10.5)
nRBC: 0 % (ref 0.0–0.2)

## 2023-09-23 SURGERY — CYSTOSCOPY, WITH BLADDER FULGURATION
Anesthesia: General

## 2023-09-23 MED ORDER — DEXAMETHASONE SODIUM PHOSPHATE 10 MG/ML IJ SOLN
INTRAMUSCULAR | Status: AC
  Filled 2023-09-23: qty 1

## 2023-09-23 MED ORDER — FENTANYL CITRATE (PF) 100 MCG/2ML IJ SOLN
INTRAMUSCULAR | Status: AC
  Filled 2023-09-23: qty 2

## 2023-09-23 MED ORDER — ONDANSETRON HCL 4 MG/2ML IJ SOLN: 4.0000 mg | Freq: Once | INTRAMUSCULAR | Status: DC | PRN

## 2023-09-23 MED ORDER — ACETAMINOPHEN 10 MG/ML IV SOLN
INTRAVENOUS | Status: AC
  Filled 2023-09-23: qty 100

## 2023-09-23 MED ORDER — LACTATED RINGERS IV SOLN: INTRAVENOUS | Status: DC | PRN

## 2023-09-23 MED ORDER — PROPOFOL 10 MG/ML IV BOLUS
INTRAVENOUS | Status: DC | PRN
  Administered 2023-09-23: 200 mg via INTRAVENOUS

## 2023-09-23 MED ORDER — AMISULPRIDE (ANTIEMETIC) 5 MG/2ML IV SOLN: 10.0000 mg | Freq: Once | INTRAVENOUS | Status: DC | PRN

## 2023-09-23 MED ORDER — FENTANYL CITRATE PF 50 MCG/ML IJ SOSY
25.0000 ug | PREFILLED_SYRINGE | INTRAMUSCULAR | Status: DC | PRN
  Administered 2023-09-23 (×2): 50 ug via INTRAVENOUS

## 2023-09-23 MED ORDER — PHENAZOPYRIDINE HCL 200 MG PO TABS: 200.0000 mg | ORAL_TABLET | Freq: Three times a day (TID) | ORAL | 0 refills | Status: AC | PRN

## 2023-09-23 MED ORDER — MIDAZOLAM HCL 5 MG/5ML IJ SOLN
INTRAMUSCULAR | Status: DC | PRN
  Administered 2023-09-23: 2 mg via INTRAVENOUS

## 2023-09-23 MED ORDER — ONDANSETRON HCL 4 MG/2ML IJ SOLN
INTRAMUSCULAR | Status: AC
  Filled 2023-09-23: qty 2

## 2023-09-23 MED ORDER — SODIUM CHLORIDE 0.9 % IR SOLN
Status: DC | PRN
  Administered 2023-09-23: 3000 mL

## 2023-09-23 MED ORDER — LACTATED RINGERS IV SOLN
INTRAVENOUS | Status: DC
Start: 2023-09-23 — End: 2023-09-24

## 2023-09-23 MED ORDER — OXYBUTYNIN CHLORIDE 5 MG PO TABS: 5.0000 mg | ORAL_TABLET | Freq: Three times a day (TID) | ORAL | 1 refills | Status: AC | PRN

## 2023-09-23 MED ORDER — FENTANYL CITRATE (PF) 100 MCG/2ML IJ SOLN
INTRAMUSCULAR | Status: DC | PRN
  Administered 2023-09-23: 50 ug via INTRAVENOUS
  Administered 2023-09-23 (×2): 25 ug via INTRAVENOUS

## 2023-09-23 MED ORDER — ORAL CARE MOUTH RINSE: 15.0000 mL | Freq: Once | OROMUCOSAL | Status: AC

## 2023-09-23 MED ORDER — KETOROLAC TROMETHAMINE 15 MG/ML IJ SOLN
INTRAMUSCULAR | Status: AC
  Filled 2023-09-23: qty 1

## 2023-09-23 MED ORDER — FENTANYL CITRATE PF 50 MCG/ML IJ SOSY
PREFILLED_SYRINGE | INTRAMUSCULAR | Status: AC
  Filled 2023-09-23: qty 2

## 2023-09-23 MED ORDER — CIPROFLOXACIN IN D5W 400 MG/200ML IV SOLN
400.0000 mg | INTRAVENOUS | Status: AC
  Administered 2023-09-23: 400 mg via INTRAVENOUS
  Filled 2023-09-23: qty 200

## 2023-09-23 MED ORDER — CIPROFLOXACIN HCL 500 MG PO TABS
500.0000 mg | ORAL_TABLET | Freq: Two times a day (BID) | ORAL | 0 refills | Status: AC
Start: 2023-09-23 — End: 2023-09-28

## 2023-09-23 MED ORDER — ACETAMINOPHEN 10 MG/ML IV SOLN
1000.0000 mg | Freq: Once | INTRAVENOUS | Status: DC | PRN
  Administered 2023-09-23: 1000 mg via INTRAVENOUS

## 2023-09-23 MED ORDER — CHLORHEXIDINE GLUCONATE 0.12 % MT SOLN
15.0000 mL | Freq: Once | OROMUCOSAL | Status: AC
  Administered 2023-09-23: 15 mL via OROMUCOSAL

## 2023-09-23 MED ORDER — ONDANSETRON HCL 4 MG/2ML IJ SOLN
INTRAMUSCULAR | Status: DC | PRN
  Administered 2023-09-23: 4 mg via INTRAVENOUS

## 2023-09-23 MED ORDER — MIDAZOLAM HCL 2 MG/2ML IJ SOLN
INTRAMUSCULAR | Status: AC
  Filled 2023-09-23: qty 2

## 2023-09-23 MED ORDER — DEXAMETHASONE SODIUM PHOSPHATE 10 MG/ML IJ SOLN
INTRAMUSCULAR | Status: DC | PRN
  Administered 2023-09-23: 5 mg via INTRAVENOUS

## 2023-09-23 MED ORDER — KETOROLAC TROMETHAMINE 15 MG/ML IJ SOLN
15.0000 mg | Freq: Once | INTRAMUSCULAR | Status: AC | PRN
  Administered 2023-09-23: 15 mg via INTRAVENOUS

## 2023-09-23 MED ORDER — PROPOFOL 10 MG/ML IV BOLUS
INTRAVENOUS | Status: AC
  Filled 2023-09-23: qty 20

## 2023-09-23 SURGICAL SUPPLY — 17 items
BAG URINE DRAIN 2000ML AR STRL (UROLOGICAL SUPPLIES) IMPLANT
BAG URO CATCHER STRL LF (MISCELLANEOUS) ×1 IMPLANT
CATH FOLEY 3WAY 30CC 22FR (CATHETERS) IMPLANT
DRAPE FOOT SWITCH (DRAPES) ×1 IMPLANT
ELECT REM PT RETURN 15FT ADLT (MISCELLANEOUS) ×1 IMPLANT
GLOVE SURG LX STRL 7.5 STRW (GLOVE) ×1 IMPLANT
GOWN STRL SURGICAL XL XLNG (GOWN DISPOSABLE) ×1 IMPLANT
GUIDEWIRE ZIPWRE .038 STRAIGHT (WIRE) IMPLANT
KIT TURNOVER KIT A (KITS) IMPLANT
LOOP CUT BIPOLAR 24F LRG (ELECTROSURGICAL) IMPLANT
MANIFOLD NEPTUNE II (INSTRUMENTS) ×1 IMPLANT
PACK CYSTO (CUSTOM PROCEDURE TRAY) ×1 IMPLANT
PLUG CATH AND CAP STRL 200 (CATHETERS) IMPLANT
SYR TOOMEY IRRIG 70ML (MISCELLANEOUS)
SYRINGE TOOMEY IRRIG 70ML (MISCELLANEOUS) IMPLANT
TUBING CONNECTING 10 (TUBING) ×1 IMPLANT
TUBING UROLOGY SET (TUBING) ×1 IMPLANT

## 2023-09-23 NOTE — Anesthesia Postprocedure Evaluation (Signed)
Anesthesia Post Note  Patient: Carlos Ellis  Procedure(s) Performed: Domenic Schwab WITH FULGERATION     Patient location during evaluation: PACU Anesthesia Type: General Level of consciousness: awake Pain management: pain level controlled Vital Signs Assessment: post-procedure vital signs reviewed and stable Respiratory status: spontaneous breathing, nonlabored ventilation and respiratory function stable Cardiovascular status: blood pressure returned to baseline and stable Postop Assessment: no apparent nausea or vomiting Anesthetic complications: no   No notable events documented.  Last Vitals:  Vitals:   09/23/23 1730 09/23/23 1745  BP: 111/80 (!) 142/85  Pulse: 84 91  Resp: 15 17  Temp: (!) 36.3 C 36.8 C  SpO2: 96% 95%    Last Pain:  Vitals:   09/23/23 1745  PainSc: 5                  Kayden Amend P Bonni Neuser

## 2023-09-23 NOTE — Op Note (Signed)
Operative Note  Preoperative diagnosis:  1.  Gross hematuria 2.  BPH with LUTS  Postoperative diagnosis: Same  Procedure(s): 1.  Cystoscopy with clot evacuation and fulguration  Surgeon: [name], MD  Assistants:  None  Anesthesia:  General  Complications:  None  EBL: 150 mL of well-formed clot was evacuated from the lumen of the bladder  Specimens: 1.  None  Drains/Catheters: 1.  22 French three-way Foley catheter with 20 mL of sterile water in the balloon  Intraoperative findings:   Large volume is residual prostate tissue, specifically at the anterior lobe and the apices (photographs taken) Generalized bleeding around the bladder neck 150 mL of clot was evacuated from the lumen of the bladder.  No other intravesical abnormalities were seen  Indication:  Carlos Ellis is a 68 y.o. male with a history of BPH with LUTS, s/p aqua ablation with Dr. [name] on 08/20/2023.  The patient presents to the office this morning with a 24-hour history of worsening gross hematuria with large clot passage along with worsening LUTS.  Multiple attempts were made to place a Foley catheter over a wire in the office, but were unsuccessful due to patient discomfort.  He has been consented for the above procedures, voices understanding and wishes to proceed.  Description of procedure:  After informed consent was obtained, the patient was brought to the operating room and general LMA anesthesia was administered. The patient was then placed in the dorsolithotomy position and prepped and draped in the usual sterile fashion. A timeout was performed.  A 26 French resectoscope was then inserted into the urethra and advanced into the bladder.  There was a large volume of residual anterior and apical prostate tissue following his aqua ablation.  There was generalized bleeding around the bladder neck along with sloughing prostatic tissue.  There was approximately 150 mL of well-formed clot  within the bladder that was evacuated through the sheath of the resectoscope.  The bipolar loop working element was then used to extensively fulgurate the bladder neck, achieving excellent hemostasis.  No other intravesical abnormalities were seen.  A Glidewire was then advanced through the sheath of the resectoscope.  A 22 French three-way Foley catheter was then advanced over the Glidewire and into the bladder with return of clear irrigant.  His Foley catheter was extensively hand irrigated with return of no residual hematuria or clot material.  His Foley catheter was then placed to gravity drainage.  He tolerated the procedure well and was transferred to the postanesthesia in stable condition.  Plan: Follow-up on 09/29/2023 for a voiding trial in the office

## 2023-09-23 NOTE — H&P (Signed)
Office Visit Report     09/23/2023   --------------------------------------------------------------------------------   Carlos Ellis. [name]  MRN: [id number]  DOB: 1955-09-26, 68 year old Male  PRIMARY CARE:  [name], MD  PRIMARY CARE FAX:  [phone]  REFERRING:  [name], MD  PROVIDER:  [name], M.D.  TREATING:  [name], M.D.  LOCATION:  [facility] [phone]     --------------------------------------------------------------------------------   CC/HPI: [name] hematuriaF/u -   1) BPH - s/p PUL x 6 and cystolithopaxy March 2022. Tried tams and finasteride. H/o weak stream and frequency. Finasteride Feb 2017. His Jul 2017 - retention after knee surgery with coude placement. Passed VT.   He has some initial weak stream in AM. Occasional noc x 1. His PVR was 28 ml. AUASS = 4-6, pleased. Prostate 57 g on MRI Aug 2019 and 80 g on [place] bx 2020. He was on finasteride. Added tamsulosin 10/20 and he stopped finasteride 03/21.   Bladder stones up to 1.5 cm noted on CT. Restarted finasteride 11/21 and prostate down to 73 g on [place]. S/p PUL and laser CLP Mar 2022. Stopped 5ari and tamsulosin. AUASS = 4. AUASS = 5.   2) PSA elevation - on and off 5ari. No FH PCa. PSA range 3.6 - 6.5 on 5ari but up to PSA up to 13 on 5ari. He has had a prostate biopsy done 2016 and 2020 at PSA 13 (26) on 5ari. He stopped 5ari 03/21 with still a low PSA 7.27 with Dr. Milinda Cave 09/21. He restarted 5ari 11/21 and a 02/22 PSA 8.3 (16.6). Off 5ari 03/22. Current PSA stable off 5ari at  Aug 2022 11.2 and Mar 2023 11.7. His Mar 2023 DRE was benign. His Sep 2023 PSA was 12.   Biopsy:  -May 2016 prostate bx Atypia: right apex 2; HGPIN: right apex 1, left base x 1, prostate 91 grams  -Jul 2020 PSA 13.2 (26.4) - biopsy - benign - 80 g , inflammation   Staging/MRI:  -Sept 2017 - prostate MRI - negative   3) gross hematuria - noted moving June- July and lifting boxes. Noted  gross hematuria that cleared (red urine). MH on UA, 11/21. 07/26/2020 CT - shows a 90 g prostate, 4 bladder stones (largest 15 mm), borderline right pelvic nodes (12 mm). Gallstones. Consider rescan in 3-6 mo. He's had some back pain after helping his father in law with a fall in Dec. Discomfort radiates to right leg. No dysuria or gross hematuria. Cysto benign.   4) ED - since 2017 - took sildenafil 100 mg - HA and flushing. Now sildenafil 20 mg x 3 tabs. Still some side effects.   Today, seen for the above. AUASS = 5. No gross hematuria. Tadalfil worked well and less side effects. His November 2023 prostate MRI was benign and a 110 g prostate. His April 2024 PSA is 17. He was as high as 13 on finasteride. AUASS = 9. Voided better right after PUL and wants to consider another procedure. His July 2024 PSA was 13.3. Cystoscopy today - Aug 2024 - with lateral and median lobe hypertrophy and multiple small bladder stones.   They head to [place] soon. His son is a Holiday representative at [employer].   05/25/2023: Pt with above noted hx. returns today for follow-up exam 1 week after undergoing cystolitholopaxy with Dr. [name]. Now on finasteride. The patient and his urologist had previously discussed proceeding with aqua ablation for definitive management of his  underlying lower urinary tract symptoms later this year. Overall doing well and recovering appropriately. Still voiding with a weak stream but expected findings of increased frequency, dysuria and gross hematuria following the recent procedure are resolving. Denies any postprocedure fevers or chills, nausea/vomiting. He is now taking finasteride and tolerating it well.   08/27/2023: Patient returns following awk ablation on 27 December with Dr. [name]. Foley catheter draining appropriately with some intermittent but improving hematuria. He had some mildly bloody catheter tubing at the meatus as well. He has not had significant painful or leaking  urgency. Denies constipation. No postoperative fevers or chills, nausea/vomiting. He remains on tamsulosin.   09/23/23: Above history noted. The patient presents today with a large volume of with clots for the past 24 to 36 hours associated with urinary hesitancy and sensation of incomplete bladder emptying.     ALLERGIES: Sulfa Drugs    MEDICATIONS: Lisinopril  Tamsulosin Hcl 0.4 mg capsule TAKE 1 CAPSULE BY MOUTH EVERY DAY AFTER SUPPER  Methylphenidate  Naproxen 500 mg tablet Oral  Sildenafil Citrate 20 mg tablet 1 tablet PO prn Take 1-5 prn  Tadalafil 5 mg tablet 1 tablet PO PRN     GU PSH: Cysto Bladder Stone <2.5cm - 2022 Cystoscopy - 03/26/2023 ESWL - 2016 Locm 300-399Mg /Ml Iodine,1Ml - 2021 Prostate Needle Biopsy - 2020 UROLIFT - 2022       PSH Notes: Oral Surgery Tooth Extraction, Wrist Surgery, Knee Arthroscopy, Lithotripsy, Knee Arthroscopy   NON-GU PSH: Dental Surgery Procedure - 2017 Surgical Pathology, Gross And Microscopic Examination For Prostate Needle - 2020 Total Knee Replacement, Left Visit Complexity (formerly GPC1X) - 12/04/2022     GU PMH: BPH w/LUTS - 08/27/2023, - 05/25/2023, He's got a large prostate which is obstructing and recurrent bladder stones. We went over the nature risk and benefits of continued medical treatment/surveillance, laser enucleation, embolization, robotic simple or aqua ablation. All questions answered. He will proceed with cystolitholopaxy and we discussed rare need for catheter. Then we will stage and schedule aqua ablation. Will start finasteride for 90 days., - 03/26/2023, - 12/04/2022, - 05/08/2022, stable voiding , - 10/29/2021, Doing well off meds. UA clear. , - 2022, - 2022, - 2022, - 2022, On alpha and 5ari. Nature r/b/a to PUL and will add that to cystolithopaxy. , - 2022, disc nature r/b of alpha blocker, 5ari- he will cont tams monotherapy. , - 2021, - 2020, - 2020, - 2020, - 2020, - 2019, - 2018, - 2018, - 2017 (Stable), brief episode of  retention after knee surgery Jul 2017., - 2017, Benign non-nodular prostatic hyperplasia with lower urinary tract symptoms, - 2017 Weak Urinary Stream - 08/27/2023, - 05/25/2023, - 03/26/2023, - 12/04/2022, - 05/08/2022, - 10/29/2021, Now off 5ari s/p PUL and cystolithopaxy. Check PSA in 2 mo. , - 2022, - 2022, - 2022, - 2021, Weak urinary stream, - 2016 Bladder Stone - 05/25/2023, - 03/26/2023, - 2022, DIsc nature r/b/a to cystolithopaxy and he will proceed. , - 2022 ED due to arterial insufficiency, Tadalafil refilled - 03/26/2023, tadalafil rx filled. , - 05/08/2022, - 2019, - 2017 Elevated PSA, PSA stable - 03/26/2023, Overall PSA stable considering on the off 5ari. PSAD normal. , - 12/04/2022, PSA up slightly but stable compared back to historical levels. Repeat MRI and monitor. , - 05/08/2022, PSA stable - exam benign. , - 10/29/2021, - 2022, PSA was resent today - on 5ari since 11/21 (~3 mo). Could consider adding prostate bx. , - 2022, PSA stable and  DRE nl. , - 2021, - 2020, - 2020, - 2020, - 2020, - 2020, - 2019, - 2018, - 2018, - 2017 (Worsening), possibly related to retention/catheter last month but needs MRI and then repeat bx. Will allow knee to heal a few more weeks. , - 2017, PSA elevation, - 2017 Gross hematuria - 2022, - 2022, complete cyst in OR , - 2022, - 2021, check CT scan and cysto - disc nature of hematuria. , - 2021 History of urolithiasis, History of renal calculi - 2016    NON-GU PMH: Encounter for general adult medical examination without abnormal findings, Encounter for preventive health examination - 2017 Personal history of other diseases of the musculoskeletal system and connective tissue, History of arthritis - 2016 Personal history of COVID-19    FAMILY HISTORY: cardiac disorder - Runs In Family Hypertension - Runs In Family Kidney Cancer - Runs In Family Prostate Cancer - Runs In Family   SOCIAL HISTORY: Marital Status: Married Preferred Language: English; Ethnicity: Not Hispanic  Or Latino; Race: White Current Smoking Status: Patient has never smoked.  Drinks 1 caffeinated drink per day.    REVIEW OF SYSTEMS:    GU Review Male:   Patient denies frequent urination, hard to postpone urination, burning/ pain with urination, get up at night to urinate, leakage of urine, stream starts and stops, trouble starting your stream, have to strain to urinate , erection problems, and penile pain.  Gastrointestinal (Upper):   Patient denies nausea, vomiting, and indigestion/ heartburn.  Gastrointestinal (Lower):   Patient denies diarrhea and constipation.  Constitutional:   Patient denies fever, night sweats, weight loss, and fatigue.  Skin:   Patient denies skin rash/ lesion and itching.  Eyes:   Patient denies blurred vision and double vision.  Ears/ Nose/ Throat:   Patient denies sore throat and sinus problems.  Hematologic/Lymphatic:   Patient denies swollen glands and easy bruising.  Cardiovascular:   Patient denies leg swelling and chest pains.  Respiratory:   Patient denies cough and shortness of breath.  Endocrine:   Patient denies excessive thirst.  Musculoskeletal:   Patient denies back pain and joint pain.  Neurological:   Patient denies headaches and dizziness.  Psychologic:   Patient denies anxiety and depression.   VITAL SIGNS: None   Complexity of Data:  Records Review:   Previous Hospital Records, Previous Patient Records   03/19/23 11/27/22 05/01/22 10/24/21 04/04/21 09/27/20 02/28/19 01/02/19  PSA  Total PSA 13.30 ng/mL 17.10 ng/mL 12.00 ng/mL 11.70 ng/mL 11.20 ng/mL 8.32 ng/mL 13.20 ng/mL 8.97 ng/mL  Free PSA        1.45 ng/mL  % Free PSA        16 % PSA    PROCEDURES:         Flexible Cystoscopy - 52000  Risks, benefits, and some of the potential complications of the procedure were discussed at length with the patient including infection, bleeding, voiding discomfort, urinary retention, fever, chills, sepsis, and others. All questions were answered.  Informed consent was obtained. Antibiotic prophylaxis was given. Sterile technique and intraurethral analgesia were used.  Meatus:  Normal size. Normal location. Normal condition.  Urethra:  No strictures.  External Sphincter:  Normal.  Verumontanum:  Normal.  Prostate:  Obstructing. Minimally resected prostate fossa. No hyperplasia.  Bladder Neck:  Obstructing  Ureteral Orifices:  Normal location. Normal size. Normal shape. Effluxed clear urine.  Bladder:  Bladder mucosal evaluation was impeded by large clot burden within the bladder  The lower urinary tract was carefully examined. The procedure was well-tolerated and without complications. Antibiotic instructions were given. Instructions were given to call the office immediately for bloody urine, difficulty urinating, urinary retention, painful or frequent urination, fever, chills, nausea, vomiting or other illness. The patient stated that he understood these instructions and would comply with them.        PVR Ultrasound - 40981  Scanned Volume: 85 cc         Urinalysis w/Scope Dipstick Dipstick Cont'd Micro  Color: Red Bilirubin: Invalid mg/dL WBC/hpf: NS (Not Seen)  Appearance: Turbid Ketones: Invalid mg/dL RBC/hpf: Packed/hpf  Specific Gravity: Invalid Blood: Invalid ery/uL Bacteria: NS (Not Seen)  pH: Invalid Protein: Invalid mg/dL Cystals: NS (Not Seen)  Glucose: Invalid mg/dL Urobilinogen: Invalid mg/dL Casts: NS (Not Seen)    Nitrites: Invalid Trichomonas: Not Present    Leukocyte Esterase: Invalid leu/uL Mucous: Present      Epithelial Cells: NS (Not Seen)      Yeast: NS (Not Seen)      Sperm: Not Present    Notes: microscopic exam completed on unspun specimen due to cellular turbidity sample is grossly bloody with many large clots present    ASSESSMENT:      ICD-10 Details  1 GU:   Gross hematuria - R31.0   2   Urinary Hesitancy - R39.11   3   Straining on Urination - R39.16      PLAN:            Schedule Return Visit/Planned Activity: ASAP - Schedule Surgery          Document Letter(s):  Created for Patient: Clinical Summary         Notes:    -Despite multiple attempts to place a 20 French Foley catheter over a wire, met resistance in the prostatic urethra. Cystoscopy revealed a significant amount of clot burden within the bladder as well as a significant amount of residual prostatic tissue that is likely the source of his ongoing bleeding following aqua ablation. Plan for cystoscopy with clot evacuation fulguration in the operating room. Risk, benefits and alternatives discussed.

## 2023-09-23 NOTE — Anesthesia Procedure Notes (Signed)
Procedure Name: LMA Insertion Date/Time: 09/23/2023 4:20 PM  Performed by: Deri Fuelling, CRNAPre-anesthesia Checklist: Patient identified, Emergency Drugs available, Suction available and Patient being monitored Patient Re-evaluated:Patient Re-evaluated prior to induction Oxygen Delivery Method: Circle system utilized Preoxygenation: Pre-oxygenation with 100% oxygen Induction Type: IV induction Ventilation: Mask ventilation without difficulty LMA: LMA with gastric port inserted LMA Size: 5.0 Number of attempts: 1 Airway Equipment and Method: Stylet and Oral airway Placement Confirmation: ETT inserted through vocal cords under direct vision, positive ETCO2 and breath sounds checked- equal and bilateral Tube secured with: Tape Dental Injury: Teeth and Oropharynx as per pre-operative assessment

## 2023-09-23 NOTE — Transfer of Care (Signed)
Immediate Anesthesia Transfer of Care Note  Patient: Carlos Ellis  Procedure(s) Performed: Domenic Schwab WITH FULGERATION  Patient Location: PACU  Anesthesia Type:General  Level of Consciousness: awake and alert   Airway & Oxygen Therapy: Patient Spontanous Breathing and Patient connected to face mask oxygen  Post-op Assessment: Report given to RN and Post -op Vital signs reviewed and stable  Post vital signs: Reviewed and stable  Last Vitals:  Vitals Value Taken Time  BP 128/70 09/23/23 1647  Temp    Pulse 85 09/23/23 1650  Resp 16 09/23/23 1650  SpO2 100 % 09/23/23 1650  Vitals shown include unfiled device data.  Last Pain:  Vitals:   09/23/23 1516  PainSc: 0-No pain         Complications: No notable events documented.

## 2023-09-23 NOTE — Anesthesia Preprocedure Evaluation (Addendum)
Anesthesia Evaluation  Patient identified by MRN, date of birth, ID band Patient awake    Reviewed: Allergy & Precautions, NPO status , Patient's Chart, lab work & pertinent test results  Airway Mallampati: II  TM Distance: >3 FB Neck ROM: Full    Dental no notable dental hx.    Pulmonary neg pulmonary ROS   Pulmonary exam normal        Cardiovascular hypertension, Pt. on medications Normal cardiovascular exam     Neuro/Psych  PSYCHIATRIC DISORDERS  Depression    negative neurological ROS     GI/Hepatic Neg liver ROS,GERD  Medicated and Controlled,,  Endo/Other  negative endocrine ROS    Renal/GU Renal disease     Musculoskeletal  (+) Arthritis ,    Abdominal  (+) + obese  Peds  (+) ADHD Hematology negative hematology ROS (+)   Anesthesia Other Findings GROSS HEMATURIA  Reproductive/Obstetrics                             Anesthesia Physical Anesthesia Plan  ASA: 2  Anesthesia Plan: General   Post-op Pain Management:    Induction: Intravenous  PONV Risk Score and Plan: 2 and Ondansetron, Dexamethasone, Midazolam and Treatment may vary due to age or medical condition  Airway Management Planned: LMA  Additional Equipment:   Intra-op Plan:   Post-operative Plan: Extubation in OR  Informed Consent: I have reviewed the patients History and Physical, chart, labs and discussed the procedure including the risks, benefits and alternatives for the proposed anesthesia with the patient or authorized representative who has indicated his/her understanding and acceptance.     Dental advisory given  Plan Discussed with: CRNA  Anesthesia Plan Comments:        Anesthesia Quick Evaluation

## 2023-09-24 ENCOUNTER — Encounter (HOSPITAL_COMMUNITY): Payer: Self-pay | Admitting: Urology

## 2023-11-12 ENCOUNTER — Other Ambulatory Visit: Payer: Self-pay | Admitting: Family Medicine

## 2023-11-12 MED ORDER — METHYLPHENIDATE HCL ER (OSM) 54 MG PO TBCR: 54.0000 mg | EXTENDED_RELEASE_TABLET | ORAL | 0 refills | Status: DC

## 2023-11-12 NOTE — Telephone Encounter (Signed)
 Requesting: methylphenidate 54mg  Contract: 05/04/21 UDS: 05/17/23 Last Visit: 05/17/23 Next Visit: 11/15/23 Last Refill: 06/25/23 (90,0)  Please Advise. Med pending

## 2023-11-15 ENCOUNTER — Ambulatory Visit: Payer: BC Managed Care – PPO | Admitting: Family Medicine

## 2023-11-24 NOTE — Patient Instructions (Signed)

## 2023-11-26 ENCOUNTER — Encounter: Payer: Self-pay | Admitting: Family Medicine

## 2023-11-26 ENCOUNTER — Ambulatory Visit (INDEPENDENT_AMBULATORY_CARE_PROVIDER_SITE_OTHER): Admitting: Family Medicine

## 2023-11-26 VITALS — BP 129/72 | HR 104 | Ht 72.0 in | Wt 266.0 lb

## 2023-11-26 DIAGNOSIS — I10 Essential (primary) hypertension: Secondary | ICD-10-CM | POA: Diagnosis not present

## 2023-11-26 DIAGNOSIS — F988 Other specified behavioral and emotional disorders with onset usually occurring in childhood and adolescence: Secondary | ICD-10-CM | POA: Diagnosis not present

## 2023-11-26 MED ORDER — NAPROXEN 500 MG PO TABS: ORAL_TABLET | ORAL | 1 refills | Status: AC

## 2023-11-26 NOTE — Progress Notes (Signed)
 OFFICE VISIT  11/26/2023  CC:  Chief Complaint  Patient presents with   Medical Management of Chronic Issues    Patient is a 68 y.o. male who presents for 75-month follow-up hypertension and adult ADD. A/P as of last visit: "1 excessive daytime sleepiness, snoring, suspected OSA. Refer to sleep MD today.   2.  Adult ADD, doing well long-term on methylphenidate CR 54 mg every morning.  He also uses Ritalin 10 mg q. afternoon as needed. Urine tox screen today.   3. hypertension, well-controlled on lisinopril 10 mg a day.   4. bladder stones.  Patient had procedure tomorrow to have these extracted."  INTERIM HX: Carlos Ellis is doing good.     Pt states all is going well with the med at current dosing: much improved focus, concentration, task completion.  Less frustration, better multitasking, less impulsivity and restlessness.  Mood is stable. No side effects from the medication.  PMP AWARE reviewed today: most recent rx for methylphenidate ER 54 mg was filled 11/12/2023, # 90, rx by me. No red flags.  Past Medical History:  Diagnosis Date   Acne    Dr. Katrinka Blazing (was on accutane at one point)   Adult ADHD 11/2017   BPH with obstruction/lower urinary tract symptoms 2015   finasteride trial 09/2015. Tamsulosin added 05/2019 urol->Surgical options discussed at that time.   Condyloma acuminata 07/2019   responded well to imiquimod   COVID 08/2019   high fever lethargic weakness x 5 days all symptoms resolved   Diverticulosis 08/2019   Noted on screening colonoscopy   Elevated PSA 2015   Bx benign 12/2014.  04/2016 prostate MRI negative. PSA stable at 4.29 as of 02/2018.  PSA doubled 08/2018-->13.2 02/2019->rpt bx benign. PSAs followed by Janetta Hora.   Episcleritis of right eye    w/mild scleritis right eye ([facility] opht 2011); also with hx of ocular varicella at age 24.   GERD (gastroesophageal reflux disease)    Tums   History of kidney stones 25 yrs ago   Hypercholesterolemia    frhm risk= 14%    Hypertension    Hypogonadism male 2009   Nephrolithiasis    Osteoarthritis of left knee    Dr. Turner Daniels   Pneumonia     Past Surgical History:  Procedure Laterality Date   COLONOSCOPY  09/22/2019   X 2, normal (for strong FH of colon cancer.  Last in approx 2010 was normal.  Rpt 08/2019 NO POLYPS->repeat 10 yrs   CYSTOSCOPY WITH FULGERATION N/A 09/23/2023   Procedure: CYSTOSCOPY, CLOT EVACTUATION WITH FULGERATION;  Surgeon: Rene Paci, MD;  Location: [facility];  Service: Urology;  Laterality: N/A;   CYSTOSCOPY WITH INSERTION OF UROLIFT  11/01/2020   Procedure: CYSTOSCOPY WITH INSERTION OF UROLIFT;  Surgeon: [name], MD;  Location: [facility];  Service: Urology;;   CYSTOSCOPY WITH LITHOLAPAXY N/A 11/01/2020   Procedure: CYSTOSCOPY WITH LITHOLAPAXY;  Surgeon: [name], MD;  Location: [facility];  Service: Urology;  Laterality: N/A;   CYSTOSCOPY WITH LITHOLAPAXY N/A 05/18/2023   Procedure: CYSTOSCOPY WITH LITHOLAPAXY;  Surgeon: [name], MD;  Location[facility];  Service: Urology;  Laterality: N/A;   FRACTURE SURGERY Left 1974   wrist    KNEE ARTHROSCOPY     Left: 1990s and early 2000's.   LITHOTRIPSY     1990s   PROSTATE BIOPSY  12/2014; 02/2019   2016 benign.  02/2019 benign.   ROTATOR CUFF REPAIR Right 2004  Right   THROAT SURGERY     Infected branchial cleft cyst 2007   TOTAL KNEE ARTHROPLASTY Left 03/10/2016   Procedure: TOTAL KNEE ARTHROPLASTY;  Surgeon: Marcene Corning, MD;  Location: [facility];  Service: Orthopedics;  Laterality: Left;    Outpatient Medications Prior to Visit  Medication Sig Dispense Refill   acetaminophen (TYLENOL) 500 MG tablet Take 1,000 mg by mouth every 6 (six) hours as needed for moderate pain (pain score 4-6).     lisinopril (ZESTRIL) 10 MG tablet Take 1 tablet (10 mg total) by mouth daily. 90 tablet 3   methylphenidate 54 MG PO CR tablet Take 1 tablet (54 mg total) by  mouth every morning. 90 tablet 0   omeprazole (PRILOSEC) 20 MG capsule Take 20 mg by mouth daily.     oxybutynin (DITROPAN) 5 MG tablet Take 1 tablet (5 mg total) by mouth every 8 (eight) hours as needed for bladder spasms. 30 tablet 1   phenazopyridine (PYRIDIUM) 200 MG tablet Take 1 tablet (200 mg total) by mouth 3 (three) times daily as needed (for pain with urination). 30 tablet 0   polyethylene glycol (MIRALAX) 17 g packet Take 17 g by mouth daily. 14 each 0   tamsulosin (FLOMAX) 0.4 MG CAPS capsule Take 1 capsule (0.4 mg total) by mouth daily after supper. 45 capsule 0   methylphenidate (RITALIN) 10 MG tablet Take 10 mg by mouth daily as needed (extra focus).     naproxen (NAPROSYN) 500 MG tablet Take 500 mg by mouth daily.     No facility-administered medications prior to visit.    Allergies  Allergen Reactions   Amlodipine Other (See Comments)    disequilibrium   Sulfa Antibiotics Rash    Review of Systems As per HPI  PE:    11/26/2023    3:48 PM 09/23/2023    6:45 PM 09/23/2023    5:45 PM  Vitals with BMI  Height 6\' 0"     Weight 266 lbs    BMI 36.07    Systolic 129 159 657  Diastolic 72 93 85  Pulse 104 83 91     Physical Exam  Gen: Alert, well appearing.  Patient is oriented to person, place, time, and situation. AFFECT: pleasant, lucid thought and speech. No further exam today  LABS:  Last CBC Lab Results  Component Value Date   WBC 9.7 09/23/2023   HGB 13.9 09/23/2023   HCT 42.7 09/23/2023   MCV 88.4 09/23/2023   MCH 28.8 09/23/2023   RDW 13.0 09/23/2023   PLT 333 09/23/2023   Last metabolic panel Lab Results  Component Value Date   GLUCOSE 98 09/23/2023   NA 139 09/23/2023   K 4.3 09/23/2023   CL 106 09/23/2023   CO2 22 09/23/2023   BUN 19 09/23/2023   CREATININE 1.11 09/23/2023   GFRNONAA >60 09/23/2023   CALCIUM 9.4 09/23/2023   PROT 7.2 05/17/2023   ALBUMIN 4.7 05/17/2023   BILITOT 0.6 05/17/2023   ALKPHOS 74 05/17/2023   AST 28  05/17/2023   ALT 30 05/17/2023   ANIONGAP 11 09/23/2023   Last lipids Lab Results  Component Value Date   CHOL 193 05/17/2023   HDL 56.30 05/17/2023   LDLCALC 112 (H) 05/17/2023   TRIG 122.0 05/17/2023   CHOLHDL 3 05/17/2023   Last thyroid functions Lab Results  Component Value Date   TSH 3.37 05/16/2021   IMPRESSION AND PLAN:  #1 adult ADD, doing well long-term on Concerta 54 mg  a day.  He no longer takes any of the methylphenidate 10 mg tabs.  Will take these off of his medication list.  #2 hypertension, well-controlled on lisinopril 10 mg a day.  #3 musculoskeletal pain.  He has some right knee osteoarthritis pain as well as some bilateral hip pains that bother him intermittently.  Naproxen has helped very well in the past.  I renewed this prescription today.  An After Visit Summary was printed and given to the patient.  FOLLOW UP: No follow-ups on file. Next CPE 04/01/2024 Signed:  Santiago Bumpers, MD           11/26/2023

## 2024-02-14 ENCOUNTER — Other Ambulatory Visit: Payer: Self-pay | Admitting: Family Medicine

## 2024-02-14 MED ORDER — METHYLPHENIDATE HCL ER (OSM) 54 MG PO TBCR: 54.0000 mg | EXTENDED_RELEASE_TABLET | ORAL | 0 refills | Status: DC

## 2024-02-14 NOTE — Telephone Encounter (Signed)
 Copied from CRM [id number]. Topic: Clinical - Medication Refill >> Feb 14, 2024  9:59 AM Aisha D wrote: Medication: methylphenidate  54 MG PO CR tablet  Has the patient contacted their pharmacy? No (Agent: If no, request that the patient contact the pharmacy for the refill. If patient does not wish to contact the pharmacy document the reason why and proceed with request.) (Agent: If yes, when and what did the pharmacy advise?)  This is the patient's preferred pharmacy:   [facility] [phone] [place], [place] - [address] [place] [address] Phone: [phone] Fax: [phone]  Is this the correct pharmacy for this prescription? Yes If no, delete pharmacy and type the correct one.   Has the prescription been filled recently? No  Is the patient out of the medication? No  Has the patient been seen for an appointment in the last year OR does the patient have an upcoming appointment? Yes  Can we respond through MyChart? Yes  Agent: Please be advised that Rx refills may take up to 3 business days. We ask that you follow-up with your pharmacy.

## 2024-02-18 ENCOUNTER — Telehealth: Payer: Self-pay

## 2024-02-18 ENCOUNTER — Other Ambulatory Visit (HOSPITAL_COMMUNITY): Payer: Self-pay

## 2024-02-18 NOTE — Telephone Encounter (Signed)
 Pharmacy Patient Advocate Encounter  Received notification from [employer] [facility] that Prior Authorization for Methylphenidate  HCl ER TBCR 54MG  tablets  has been APPROVED from 01/19/2024 to 02/17/2025 SEE OUTCOME BELOW Approved today by [employer] 2017 CaseId:[id number];Status:Approved;Review Type:Prior Auth;Coverage Start Date:01/19/2024;Coverage End Date:02/17/2025; Effective Date: 01/19/2024 Authorization Expiration Date: 02/17/2025   PA #/Case ID/Reference #: [id number]

## 2024-02-18 NOTE — Telephone Encounter (Signed)
 PLEASE BE ADVISED Clinical questions have been answered and PA submitted.TO PLAN. PA currently Pending.

## 2024-02-18 NOTE — Telephone Encounter (Signed)
 Pharmacy Patient Advocate Encounter   Received notification from CoverMyMeds that prior authorization for Methylphenidate  HCl ER TBCR 54MG  tablets  is required/requested.   Insurance verification completed.   The patient is insured through [employer] .   Per test claim: PA required; PA started via CoverMyMeds. KEY [id number] . Waiting for clinical questions to populate.

## 2024-05-01 ENCOUNTER — Encounter: Admitting: Family Medicine

## 2024-05-05 ENCOUNTER — Ambulatory Visit (INDEPENDENT_AMBULATORY_CARE_PROVIDER_SITE_OTHER): Admitting: Family Medicine

## 2024-05-05 ENCOUNTER — Encounter: Payer: Self-pay | Admitting: Family Medicine

## 2024-05-05 ENCOUNTER — Other Ambulatory Visit (HOSPITAL_COMMUNITY)
Admission: RE | Admit: 2024-05-05 | Discharge: 2024-05-05 | Disposition: A | Discharge status: Home / Self Care | Source: Ambulatory Visit | Attending: Family Medicine | Admitting: Family Medicine

## 2024-05-05 VITALS — BP 145/70 | HR 66 | Temp 97.6°F | Ht 72.5 in | Wt 278.8 lb

## 2024-05-05 DIAGNOSIS — Z0001 Encounter for general adult medical examination with abnormal findings: Secondary | ICD-10-CM

## 2024-05-05 DIAGNOSIS — Z113 Encounter for screening for infections with a predominantly sexual mode of transmission: Secondary | ICD-10-CM | POA: Insufficient documentation

## 2024-05-05 DIAGNOSIS — Z0000 Encounter for general adult medical examination without abnormal findings: Secondary | ICD-10-CM

## 2024-05-05 DIAGNOSIS — R7301 Impaired fasting glucose: Secondary | ICD-10-CM | POA: Diagnosis not present

## 2024-05-05 DIAGNOSIS — Z79899 Other long term (current) drug therapy: Secondary | ICD-10-CM | POA: Diagnosis not present

## 2024-05-05 DIAGNOSIS — F988 Other specified behavioral and emotional disorders with onset usually occurring in childhood and adolescence: Secondary | ICD-10-CM | POA: Diagnosis not present

## 2024-05-05 DIAGNOSIS — Z23 Encounter for immunization: Secondary | ICD-10-CM

## 2024-05-05 DIAGNOSIS — I10 Essential (primary) hypertension: Secondary | ICD-10-CM

## 2024-05-05 MED ORDER — METHYLPHENIDATE HCL ER (OSM) 54 MG PO TBCR: 54.0000 mg | EXTENDED_RELEASE_TABLET | ORAL | 0 refills | Status: DC

## 2024-05-05 MED ORDER — LISINOPRIL 20 MG PO TABS: 20.0000 mg | ORAL_TABLET | Freq: Every day | ORAL | 3 refills | Status: DC

## 2024-05-05 NOTE — Addendum Note (Signed)
 Addended by: FLETA CARE D on: 05/05/2024 02:55 PM   Modules accepted: Orders

## 2024-05-05 NOTE — Progress Notes (Signed)
 Office Note 05/05/2024  CC:  Chief Complaint  Patient presents with   Annual Exam   Patient is a 68 y.o. male who is here for annual health maintenance exam and 1-month follow-up hypertension and adult ADD. A/P as of last visit: 1 adult ADD, doing well long-term on Concerta  54 mg a day.  He no longer takes any of the methylphenidate  10 mg tabs.  Will take these off of his medication list.   #2 hypertension, well-controlled on lisinopril  10 mg a day.   #3 musculoskeletal pain.  He has some right knee osteoarthritis pain as well as some bilateral hip pains that bother him intermittently.  Naproxen  has helped very well in the past.  I renewed this prescription today.  INTERIM HX: Jeromey feels well. Occasionally checks home blood pressure and it is never higher than 145 per his recollection.    Pt states all is going well with the med at current dosing (methylphenidate  CR 54 mg daily): much improved focus, concentration, task completion.  Less frustration, better multitasking, less impulsivity and restlessness.  Mood is stable. No side effects from the medication.  PMP AWARE reviewed today: most recent rx for methylphenidate  ER 54 milligram was filled 02/20/2024, # 90, rx by me. No red flags.   Past Medical History:  Diagnosis Date   Acne    Dr. Claudene (was on accutane at one point)   Adult ADHD 11/2017   BPH with obstruction/lower urinary tract symptoms 2015   finasteride  trial 09/2015. Tamsulosin  added 05/2019 urol->Surgical options discussed at that time.   Condyloma acuminata 07/2019   responded well to imiquimod    COVID 08/2019   high fever lethargic weakness x 5 days all symptoms resolved   Diverticulosis 08/2019   Noted on screening colonoscopy   Elevated PSA 2015   Bx benign 12/2014.  04/2016 prostate MRI negative. PSA stable at 4.29 as of 02/2018.  PSA doubled 08/2018-->13.2 02/2019->rpt bx benign. PSAs followed by [facility].   Episcleritis of right eye    w/mild scleritis  right eye ([facility] opht 2011); also with hx of ocular varicella at age 68.   GERD (gastroesophageal reflux disease)    Tums   History of kidney stones 25 yrs ago   Hypercholesterolemia    frhm risk= 14%   Hypertension    Hypogonadism male 2009   Nephrolithiasis    Osteoarthritis of left knee    Dr. Liam   Pneumonia     Past Surgical History:  Procedure Laterality Date   COLONOSCOPY  09/22/2019   X 2, normal (for strong FH of colon cancer.  Last in approx 2010 was normal.  Rpt 08/2019 NO POLYPS->repeat 10 yrs   CYSTOSCOPY WITH FULGERATION N/A 09/23/2023   Procedure: CYSTOSCOPY, CLOT EVACTUATION WITH FULGERATION;  Surgeon: Devere Lonni Righter, MD;  Location: [facility];  Service: Urology;  Laterality: N/A;   CYSTOSCOPY WITH INSERTION OF UROLIFT  11/01/2020   Procedure: CYSTOSCOPY WITH INSERTION OF UROLIFT;  Surgeon: Nieves Cough, MD;  Location[facility];  Service: Urology;;   CYSTOSCOPY WITH LITHOLAPAXY N/A 11/01/2020   Procedure: CYSTOSCOPY WITH LITHOLAPAXY;  Surgeon: Nieves Cough, MD;  Location: [facility];  Service: Urology;  Laterality: N/A;   CYSTOSCOPY WITH LITHOLAPAXY N/A 05/18/2023   Procedure: CYSTOSCOPY WITH LITHOLAPAXY;  Surgeon: Nieves Cough, MD;  Location: [facility];  Service: Urology;  Laterality: N/A;   FRACTURE SURGERY Left 1974   wrist    KNEE ARTHROSCOPY     Left:  1990s and early 2000's.   LITHOTRIPSY     1990s   PROSTATE BIOPSY  12/2014; 02/2019   2016 benign.  02/2019 benign.   ROTATOR CUFF REPAIR Right 2004   Right   THROAT SURGERY     Infected branchial cleft cyst 2007   TOTAL KNEE ARTHROPLASTY Left 03/10/2016   Procedure: TOTAL KNEE ARTHROPLASTY;  Surgeon: Maude Herald, MD;  Location: [facility];  Service: Orthopedics;  Laterality: Left;    Family History  Problem Relation Age of Onset   Cancer Paternal Grandfather        either prostate, colon or something in that area - not definitive dx of  cancer    Multiple myeloma Brother    Colon cancer Neg Hx    Colon polyps Neg Hx    Esophageal cancer Neg Hx    Rectal cancer Neg Hx    Stomach cancer Neg Hx     Social History   Socioeconomic History   Marital status: Married    Spouse name: Not on file   Number of children: Not on file   Years of education: Not on file   Highest education level: Not on file  Occupational History   Not on file  Tobacco Use   Smoking status: Never   Smokeless tobacco: Never  Vaping Use   Vaping status: Never Used  Substance and Sexual Activity   Alcohol use: Yes    Alcohol/week: 3.0 standard drinks of alcohol    Types: 3 Glasses of wine per week    Comment: few week   Drug use: No   Sexual activity: Not on file  Other Topics Concern   Not on file  Social History Narrative   Married, 2 teenage children.   Relocated to [place] from [place]  2007.   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   No tobacco.  Drinks 2-3 glasses of wine per week.  No drugs.   Walks his dog about 1 mile per day.   Social Drivers of Corporate investment banker Strain: Low Risk  (03/07/2024)   Received from [facility]   Overall Financial Resource Strain (CARDIA)    Difficulty of Paying Living Expenses: Not hard at all  Food Insecurity: No Food Insecurity (03/07/2024)   Received from [facility]   Hunger Vital Sign    Within the past 12 months, you worried that your food would run out before you got the money to buy more.: Never true    Within the past 12 months, the food you bought just didn't last and you didn't have money to get more.: Never true  Transportation Needs: No Transportation Needs (03/07/2024)   Received from [facility] - Transportation    In the past 12 months, has lack of transportation kept you from medical appointments or from getting medications?: No    Lack of Transportation  (Non-Medical): No  Physical Activity: Inactive (12/02/2023)   Received from [facility]   Exercise Vital Sign    On average, how many days per week do you engage in moderate to strenuous exercise (like a brisk walk)?: 0 days    On average, how many minutes do you engage in exercise at this level?: 0 min  Stress: Not on file  Social Connections: Not on file  Intimate Partner Violence: Not At Risk (08/20/2023)   Humiliation, Afraid, Rape, and Kick  questionnaire    Fear of Current or Ex-Partner: No    Emotionally Abused: No    Physically Abused: No    Sexually Abused: No    Outpatient Medications Prior to Visit  Medication Sig Dispense Refill   acetaminophen  (TYLENOL ) 500 MG tablet Take 1,000 mg by mouth every 6 (six) hours as needed for moderate pain (pain score 4-6).     naproxen  (NAPROSYN ) 500 MG tablet 1 tab p.o. bid as needed for pain 60 tablet 1   omeprazole (PRILOSEC) 20 MG capsule Take 20 mg by mouth daily.     polyethylene glycol (MIRALAX ) 17 g packet Take 17 g by mouth daily. 14 each 0   tamsulosin  (FLOMAX ) 0.4 MG CAPS capsule Take 1 capsule (0.4 mg total) by mouth daily after supper. 45 capsule 0   lisinopril  (ZESTRIL ) 10 MG tablet Take 1 tablet (10 mg total) by mouth daily. 90 tablet 3   oxybutynin  (DITROPAN ) 5 MG tablet Take 1 tablet (5 mg total) by mouth every 8 (eight) hours as needed for bladder spasms. (Patient not taking: Reported on 05/05/2024) 30 tablet 1   phenazopyridine  (PYRIDIUM ) 200 MG tablet Take 1 tablet (200 mg total) by mouth 3 (three) times daily as needed (for pain with urination). (Patient not taking: Reported on 05/05/2024) 30 tablet 0   methylphenidate  54 MG PO CR tablet Take 1 tablet (54 mg total) by mouth every morning. 90 tablet 0   No facility-administered medications prior to visit.    Allergies  Allergen Reactions   Amlodipine  Other (See Comments)    disequilibrium   Sulfa Antibiotics Rash    Review of Systems  Constitutional:   Negative for appetite change, chills, fatigue and fever.  HENT:  Negative for congestion, dental problem, ear pain and sore throat.   Eyes:  Negative for discharge, redness and visual disturbance.  Respiratory:  Negative for cough, chest tightness, shortness of breath and wheezing.   Cardiovascular:  Negative for chest pain, palpitations and leg swelling.  Gastrointestinal:  Negative for abdominal pain, blood in stool, diarrhea, nausea and vomiting.  Genitourinary:  Negative for difficulty urinating, dysuria, flank pain, frequency, hematuria and urgency.  Musculoskeletal:  Negative for arthralgias, back pain, joint swelling, myalgias and neck stiffness.  Skin:  Negative for pallor and rash.  Neurological:  Negative for dizziness, speech difficulty, weakness and headaches.  Hematological:  Negative for adenopathy. Does not bruise/bleed easily.  Psychiatric/Behavioral:  Negative for confusion and sleep disturbance. The patient is not nervous/anxious.     PE;    05/05/2024    2:16 PM 05/05/2024    2:06 PM 11/26/2023    3:48 PM  Vitals with BMI  Height  6' 0.5 6' 0  Weight  278 lbs 13 oz 266 lbs  BMI  37.27 36.07  Systolic 145 173 870  Diastolic 70 75 72  Pulse  66 104     Gen: Alert, well appearing.  Patient is oriented to person, place, time, and situation. AFFECT: pleasant, lucid thought and speech. ENT: Ears: EACs clear, normal epithelium.  TMs with good light reflex and landmarks bilaterally.  Eyes: no injection, icteris, swelling, or exudate.  EOMI, PERRLA. Nose: no drainage or turbinate edema/swelling.  No injection or focal lesion.  Mouth: lips without lesion/swelling.  Oral mucosa pink and moist.  Dentition intact and without obvious caries or gingival swelling.  Oropharynx without erythema, exudate, or swelling.  Neck: supple/nontender.  No LAD, mass, or TM.  Carotid pulses 2+ bilaterally, without bruits.  CV: RRR, no m/r/g.   LUNGS: CTA bilat, nonlabored resps, good aeration  in all lung fields. ABD: soft, NT, ND, BS normal.  No hepatospenomegaly or mass.  No bruits. EXT: no clubbing, cyanosis, or edema.  Musculoskeletal: no joint swelling, erythema, warmth, or tenderness.  ROM of all joints intact. Skin - no sores or suspicious lesions or rashes or color changes  Pertinent labs:  Lab Results  Component Value Date   TSH 3.37 05/16/2021   Lab Results  Component Value Date   WBC 9.7 09/23/2023   HGB 13.9 09/23/2023   HCT 42.7 09/23/2023   MCV 88.4 09/23/2023   PLT 333 09/23/2023   Lab Results  Component Value Date   CREATININE 1.11 09/23/2023   BUN 19 09/23/2023   NA 139 09/23/2023   K 4.3 09/23/2023   CL 106 09/23/2023   CO2 22 09/23/2023   Lab Results  Component Value Date   ALT 30 05/17/2023   AST 28 05/17/2023   ALKPHOS 74 05/17/2023   BILITOT 0.6 05/17/2023   Lab Results  Component Value Date   CHOL 193 05/17/2023   Lab Results  Component Value Date   HDL 56.30 05/17/2023   Lab Results  Component Value Date   LDLCALC 112 (H) 05/17/2023   Lab Results  Component Value Date   TRIG 122.0 05/17/2023   Lab Results  Component Value Date   CHOLHDL 3 05/17/2023   Lab Results  Component Value Date   PSA 13.30 03/19/2023   PSA 13.3 03/19/2023   PSA 17.10 11/27/2022   ASSESSMENT AND PLAN:   No problem-specific Assessment & Plan notes found for this encounter.   #1 health maintenance exam: Reviewed age and gender appropriate health maintenance issues (prudent diet, regular exercise, health risks of tobacco and excessive alcohol, use of seatbelts, fire alarms in home, use of sunscreen).  Also reviewed age and gender appropriate health screening as well as vaccine recommendations. Vaccines: Flu->given today.   Otherwise UTD. Labs: cbc,cmet, lipids,  Prostate ca screening: History of elevated PSAs.  Followed by urology. Colon ca screening: recall 2031.  #2 hypertension, not well-controlled.  Increase lisinopril  to 20 mg a  day. Electrolytes and creatinine today. Repeat these at follow-up in 2 weeks.  3.  Adult ADD, doing well long-term on Concerta  54 mg a day. Renewed 90-day prescription today. Controlled substance contract updated. UDS today.  An After Visit Summary was printed and given to the patient.  FOLLOW UP:  Return in about 2 weeks (around 05/19/2024) for f/u blood pressure and will do BMET.  Signed:  Gerlene Hockey, MD           05/05/2024

## 2024-05-06 LAB — COMPREHENSIVE METABOLIC PANEL WITH GFR
AG Ratio: 2.1 (calc) (ref 1.0–2.5)
ALT: 37 U/L (ref 9–46)
AST: 26 U/L (ref 10–35)
Albumin: 4.9 g/dL (ref 3.6–5.1)
Alkaline phosphatase (APISO): 71 U/L (ref 35–144)
BUN: 16 mg/dL (ref 7–25)
CO2: 28 mmol/L (ref 20–32)
Calcium: 9.7 mg/dL (ref 8.6–10.3)
Chloride: 103 mmol/L (ref 98–110)
Creat: 0.86 mg/dL (ref 0.70–1.35)
Globulin: 2.3 g/dL (ref 1.9–3.7)
Glucose, Bld: 87 mg/dL (ref 65–99)
Potassium: 4.4 mmol/L (ref 3.5–5.3)
Sodium: 140 mmol/L (ref 135–146)
Total Bilirubin: 0.6 mg/dL (ref 0.2–1.2)
Total Protein: 7.2 g/dL (ref 6.1–8.1)
eGFR: 95 mL/min/1.73m2 (ref >=60)

## 2024-05-06 LAB — CBC WITH DIFFERENTIAL/PLATELET
Absolute Lymphocytes: 1911 {cells}/uL (ref 850–3900)
Absolute Monocytes: 725 {cells}/uL (ref 200–950)
Basophils Absolute: 42 {cells}/uL (ref 0–200)
Basophils Relative: 0.4 %
Eosinophils Absolute: 116 {cells}/uL (ref 15–500)
Eosinophils Relative: 1.1 %
HCT: 43.2 % (ref 38.5–50.0)
Hemoglobin: 14.5 g/dL (ref 13.2–17.1)
MCH: 29.3 pg (ref 27.0–33.0)
MCHC: 33.6 g/dL (ref 32.0–36.0)
MCV: 87.3 fL (ref 80.0–100.0)
MPV: 9.7 fL (ref 7.5–12.5)
Monocytes Relative: 6.9 %
Neutro Abs: 7707 {cells}/uL (ref 1500–7800)
Neutrophils Relative %: 73.4 %
Platelets: 300 Thousand/uL (ref 140–400)
RBC: 4.95 Million/uL (ref 4.20–5.80)
RDW: 13.3 % (ref 11.0–15.0)
Total Lymphocyte: 18.2 %
WBC: 10.5 Thousand/uL (ref 3.8–10.8)

## 2024-05-06 LAB — DRUG MONITOR, PANEL 1, SCREEN, URINE
Amphetamines: NEGATIVE ng/mL (ref <500)
Barbiturates: NEGATIVE ng/mL (ref <300)
Benzodiazepines: NEGATIVE ng/mL (ref <100)
Cocaine Metabolite: NEGATIVE ng/mL (ref <150)
Creatinine: 70.3 mg/dL (ref >=20.0)
Marijuana Metabolite: NEGATIVE ng/mL (ref <20)
Methadone Metabolite: NEGATIVE ng/mL (ref <100)
Opiates: NEGATIVE ng/mL (ref <100)
Oxidant: NEGATIVE ug/mL (ref <200)
Oxycodone: NEGATIVE ng/mL (ref <100)
Phencyclidine: NEGATIVE ng/mL (ref <25)
pH: 5.6 (ref 4.5–9.0)

## 2024-05-06 LAB — LIPID PANEL
Cholesterol: 178 mg/dL (ref <200)
HDL: 52 mg/dL (ref >=40)
LDL Cholesterol (Calc): 103 mg/dL — ABNORMAL HIGH
Non-HDL Cholesterol (Calc): 126 mg/dL (ref <130)
Total CHOL/HDL Ratio: 3.4 (calc) (ref <5.0)
Triglycerides: 130 mg/dL (ref <150)

## 2024-05-06 LAB — HIV ANTIBODY (ROUTINE TESTING W REFLEX)
HIV 1&2 Ab, 4th Generation: NONREACTIVE
HIV FINAL INTERPRETATION: NEGATIVE

## 2024-05-06 LAB — HEMOGLOBIN A1C
Hgb A1c MFr Bld: 5.6 % (ref <5.7)
Mean Plasma Glucose: 114 mg/dL
eAG (mmol/L): 6.3 mmol/L

## 2024-05-06 LAB — RPR: RPR Ser Ql: NONREACTIVE

## 2024-05-06 LAB — DM TEMPLATE

## 2024-05-09 ENCOUNTER — Ambulatory Visit: Payer: Self-pay | Admitting: Family Medicine

## 2024-05-09 LAB — URINE CYTOLOGY ANCILLARY ONLY
Chlamydia: NEGATIVE
Comment: NEGATIVE
Comment: NORMAL
Neisseria Gonorrhea: NEGATIVE

## 2024-05-09 NOTE — Progress Notes (Signed)
 Clifford thanks

## 2024-05-19 ENCOUNTER — Encounter: Payer: Self-pay | Admitting: Family Medicine

## 2024-05-19 ENCOUNTER — Ambulatory Visit: Admitting: Family Medicine

## 2024-05-26 ENCOUNTER — Encounter: Payer: Self-pay | Admitting: Family Medicine

## 2024-05-26 ENCOUNTER — Ambulatory Visit: Admitting: Family Medicine

## 2024-05-26 VITALS — BP 140/80 | HR 77 | Temp 97.6°F | Ht 72.5 in | Wt 278.8 lb

## 2024-05-26 DIAGNOSIS — I10 Essential (primary) hypertension: Secondary | ICD-10-CM

## 2024-05-26 DIAGNOSIS — Z125 Encounter for screening for malignant neoplasm of prostate: Secondary | ICD-10-CM

## 2024-05-26 MED ORDER — LISINOPRIL 20 MG PO TABS: ORAL_TABLET | ORAL | Status: AC

## 2024-05-26 NOTE — Progress Notes (Signed)
 OFFICE VISIT  05/26/2024  CC:  Chief Complaint  Patient presents with   Medical Management of Chronic Issues    Patient is a 68 y.o. male who presents for 3-week follow-up uncontrolled hypertension. Last visit I increased his lisinopril  to 20 mg a day.  INTERIM HX: After last visit he finished his 10 mg tabs by taking 2 daily.  He picked up his 20 mg tab prescription and continued to take 2 tabs daily. Home blood pressures still typically 145-150 systolic over around 75-85 diastolic. He feels well.  Past Medical History:  Diagnosis Date   Acne    Dr. Claudene (was on accutane at one point)   Adult ADHD 11/2017   BPH with obstruction/lower urinary tract symptoms 2015   finasteride  trial 09/2015. Tamsulosin  added 05/2019 urol->Surgical options discussed at that time.   Condyloma acuminata 07/2019   responded well to imiquimod    COVID 08/2019   high fever lethargic weakness x 5 days all symptoms resolved   Diverticulosis 08/2019   Noted on screening colonoscopy   Elevated PSA 2015   Bx benign 12/2014.  04/2016 prostate MRI negative. PSA stable at 4.29 as of 02/2018.  PSA doubled 08/2018-->13.2 02/2019->rpt bx benign. PSAs followed by [facility].   Episcleritis of right eye    w/mild scleritis right eye ([facility] opht 2011); also with hx of ocular varicella at age 56.   GERD (gastroesophageal reflux disease)    Tums   History of kidney stones 25 yrs ago   Hypercholesterolemia    frhm risk= 14%   Hypertension    Hypogonadism male 2009   Nephrolithiasis    Osteoarthritis of left knee    Dr. Liam   Pneumonia     Past Surgical History:  Procedure Laterality Date   COLONOSCOPY  09/22/2019   X 2, normal (for strong FH of colon cancer.  Last in approx 2010 was normal.  Rpt 08/2019 NO POLYPS->repeat 10 yrs   CYSTOSCOPY WITH FULGERATION N/A 09/23/2023   Procedure: CYSTOSCOPY, CLOT EVACTUATION WITH FULGERATION;  Surgeon: Devere Lonni Righter, MD;  Location: [facility];  Service: Urology;   Laterality: N/A;   CYSTOSCOPY WITH INSERTION OF UROLIFT  11/01/2020   Procedure: CYSTOSCOPY WITH INSERTION OF UROLIFT;  Surgeon: Nieves Cough, MD;  Location: [facility];  Service: Urology;;   CYSTOSCOPY WITH LITHOLAPAXY N/A 11/01/2020   Procedure: CYSTOSCOPY WITH LITHOLAPAXY;  Surgeon: Nieves Cough, MD;  [facility];  Service: Urology;  Laterality: N/A;   CYSTOSCOPY WITH LITHOLAPAXY N/A 05/18/2023   Procedure: CYSTOSCOPY WITH LITHOLAPAXY;  Surgeon: Nieves Cough, MD;  Location: [facility];  Service: Urology;  Laterality: N/A;   FRACTURE SURGERY Left 1974   wrist    KNEE ARTHROSCOPY     Left: 1990s and early 2000's.   LITHOTRIPSY     1990s   PROSTATE BIOPSY  12/2014; 02/2019   2016 benign.  02/2019 benign.   ROTATOR CUFF REPAIR Right 2004   Right   THROAT SURGERY     Infected branchial cleft cyst 2007   TOTAL KNEE ARTHROPLASTY Left 03/10/2016   Procedure: TOTAL KNEE ARTHROPLASTY;  Surgeon: Maude Herald, MD;  Location: [facility];  Service: Orthopedics;  Laterality: Left;    Outpatient Medications Prior to Visit  Medication Sig Dispense Refill   acetaminophen  (TYLENOL ) 500 MG tablet Take 1,000 mg by mouth every 6 (six) hours as needed for moderate pain (pain score 4-6).     methylphenidate  54 MG PO CR tablet Take  1 tablet (54 mg total) by mouth every morning. 90 tablet 0   naproxen  (NAPROSYN ) 500 MG tablet 1 tab p.o. bid as needed for pain 60 tablet 1   omeprazole (PRILOSEC) 20 MG capsule Take 20 mg by mouth daily.     oxybutynin  (DITROPAN ) 5 MG tablet Take 1 tablet (5 mg total) by mouth every 8 (eight) hours as needed for bladder spasms. (Patient not taking: Reported on 05/05/2024) 30 tablet 1   phenazopyridine  (PYRIDIUM ) 200 MG tablet Take 1 tablet (200 mg total) by mouth 3 (three) times daily as needed (for pain with urination). (Patient not taking: Reported on 05/05/2024) 30 tablet 0   polyethylene glycol (MIRALAX ) 17 g  packet Take 17 g by mouth daily. 14 each 0   tamsulosin  (FLOMAX ) 0.4 MG CAPS capsule Take 1 capsule (0.4 mg total) by mouth daily after supper. 45 capsule 0   lisinopril  (ZESTRIL ) 20 MG tablet Take 1 tablet (20 mg total) by mouth daily. 90 tablet 3   No facility-administered medications prior to visit.    Allergies  Allergen Reactions   Amlodipine  Other (See Comments)    disequilibrium   Sulfa Antibiotics Rash    Review of Systems As per HPI  PE:    05/26/2024    3:50 PM 05/26/2024    3:29 PM 05/05/2024    2:16 PM  Vitals with BMI  Height  6' 0.5   Weight  278 lbs 13 oz   BMI  37.27   Systolic 140 159 854  Diastolic 80 80 70  Pulse  77      Physical Exam  Gen: Alert, well appearing.  Patient is oriented to person, place, time, and situation. AFFECT: pleasant, lucid thought and speech. No further exam today.  LABS:  Last CBC Lab Results  Component Value Date   WBC 10.5 05/05/2024   HGB 14.5 05/05/2024   HCT 43.2 05/05/2024   MCV 87.3 05/05/2024   MCH 29.3 05/05/2024   RDW 13.3 05/05/2024   PLT 300 05/05/2024   Last metabolic panel Lab Results  Component Value Date   GLUCOSE 87 05/05/2024   NA 140 05/05/2024   K 4.4 05/05/2024   CL 103 05/05/2024   CO2 28 05/05/2024   BUN 16 05/05/2024   CREATININE 0.86 05/05/2024   EGFR 95 05/05/2024   CALCIUM 9.7 05/05/2024   PROT 7.2 05/05/2024   ALBUMIN 4.7 05/17/2023   BILITOT 0.6 05/05/2024   ALKPHOS 74 05/17/2023   AST 26 05/05/2024   ALT 37 05/05/2024   ANIONGAP 11 09/23/2023   Last lipids Lab Results  Component Value Date   CHOL 178 05/05/2024   HDL 52 05/05/2024   LDLCALC 103 (H) 05/05/2024   TRIG 130 05/05/2024   CHOLHDL 3.4 05/05/2024   Lab Results  Component Value Date   HGBA1C 5.6 05/05/2024   IMPRESSION AND PLAN:  #1 uncontrolled hypertension. He is currently taking 2 of the 20 mg lisinopril  tabs daily.  We discussed adding a second medication but he prefers to hold off for now.  He  will continue home blood pressure monitoring and work on improving diet and exercise habits. Basic metabolic panel ordered and he [facility] [place] [facility] his home.  2.  Prostate cancer screening. Medicare PSA must be done through harvest but our pickup for harvest on Friday afternoon comes before 2:00.  I have ordered this test today as future and he can get blood drawn  at the [facility] location when he gets his basic metabolic panel.  An After Visit Summary was printed and given to the patient.  FOLLOW UP: Return in about 3 months (around 08/26/2024) for routine chronic illness f/u.  Signed:  Gerlene Hockey, MD           05/26/2024

## 2024-05-26 NOTE — Patient Instructions (Addendum)
    [facility] Location: [address] Lab hours: Monday-Friday, 8 a.m. - 5 p.m.  Continue to take 2 of the lisinopril  20 mg tabs daily until finished with the current bottle. When you are a few days from running out call our office or send MyChart message requesting a prescription for the 40 mg tab.

## 2024-06-26 ENCOUNTER — Other Ambulatory Visit: Payer: Self-pay | Admitting: Family Medicine

## 2024-08-28 ENCOUNTER — Other Ambulatory Visit: Payer: Self-pay | Admitting: Family Medicine

## 2024-08-28 MED ORDER — METHYLPHENIDATE HCL ER (OSM) 54 MG PO TBCR: 54.0000 mg | EXTENDED_RELEASE_TABLET | ORAL | 0 refills | Status: AC

## 2024-08-28 NOTE — Telephone Encounter (Signed)
 Copied from CRM #[id number]. Topic: Clinical - Medication Refill >> Aug 28, 2024 10:32 AM China J wrote: Medication: methylphenidate  54 MG PO CR tablet  Has the patient contacted their pharmacy? Yes (Agent: If no, request that the patient contact the pharmacy for the refill. If patient does not wish to contact the pharmacy document the reason why and proceed with request.) (Agent: If yes, when and what did the pharmacy advise?) Will not take request due to no refills on the script.  This is the patient's preferred pharmacy:   [phone] [place] [place] - [address] [place] [address] Phone: [phone] Fax: [phone]  Is this the correct pharmacy for this prescription? Yes If no, delete pharmacy and type the correct one.   Has the prescription been filled recently? No  Is the patient out of the medication? Yes  Has the patient been seen for an appointment in the last year OR does the patient have an upcoming appointment? Yes  Can we respond through MyChart? Yes  Agent: Please be advised that Rx refills may take up to 3 business days. We ask that you follow-up with your pharmacy.

## 2024-09-01 ENCOUNTER — Ambulatory Visit: Admitting: Family Medicine

## 2024-09-01 NOTE — Progress Notes (Unsigned)
 OFFICE VISIT  09/01/2024  CC: No chief complaint on file.   Patient is a 69 y.o. male who presents for 58-month follow-up hypertension and adult ADD. A/P as of last visit: #1 uncontrolled hypertension. He is currently taking 2 of the 20 mg lisinopril  tabs daily.  We discussed adding a second medication but he prefers to hold off for now.  He will continue home blood pressure monitoring and work on improving diet and exercise habits. Basic metabolic panel ordered and [facility] will [facility] [place] [facility] his [facility].   2.  Prostate cancer screening. Medicare PSA must be done through harvest but our pickup for harvest on Friday afternoon comes before 2:00.  I have ordered this test today as future and he can get blood drawn at the [facility] location when he gets his basic metabolic panel.  INTERIM HX: He did not get his basic metabolic panel or PSA check after last visit. ***   PMP AWARE reviewed today: most recent rx for methylphenidate  ER 54 mg was filled 08/28/2024, # 90, rx by me. No red flags.  Past Medical History:  Diagnosis Date   Acne    Dr. Claudene (was on accutane at one point)   Adult ADHD 11/2017   BPH with obstruction/lower urinary tract symptoms 2015   finasteride  trial 09/2015. Tamsulosin  added 05/2019 urol->Surgical options discussed at that time.   Condyloma acuminata 07/2019   responded well to imiquimod    COVID 08/2019   high fever lethargic weakness x 5 days all symptoms resolved   Diverticulosis 08/2019   Noted on screening colonoscopy   Elevated PSA 2015   Bx benign 12/2014.  04/2016 prostate MRI negative. PSA stable at 4.29 as of 02/2018.  PSA doubled 08/2018-->13.2 02/2019->rpt bx benign. PSAs followed by [facility].   Episcleritis of right eye    w/mild scleritis right eye ([facility] opht 2011); also with hx of ocular varicella at age 34.   GERD (gastroesophageal reflux disease)    Tums   History of kidney stones 25 yrs ago   Hypercholesterolemia     frhm risk= 14%   Hypertension    Hypogonadism male 2009   Nephrolithiasis    Osteoarthritis of left knee    Dr. Liam   Pneumonia     Past Surgical History:  Procedure Laterality Date   COLONOSCOPY  09/22/2019   X 2, normal (for strong FH of colon cancer.  Last in approx 2010 was normal.  Rpt 08/2019 NO POLYPS->repeat 10 yrs   CYSTOSCOPY WITH FULGERATION N/A 09/23/2023   Procedure: CYSTOSCOPY, CLOT EVACTUATION WITH FULGERATION;  Surgeon: Devere Lonni Righter, MD;  Location: [facility];  Service: Urology;  Laterality: N/A;   CYSTOSCOPY WITH INSERTION OF UROLIFT  11/01/2020   Procedure: CYSTOSCOPY WITH INSERTION OF UROLIFT;  Surgeon: Nieves Cough, MD;  Location: [facility];  Service: Urology;;   CYSTOSCOPY WITH LITHOLAPAXY N/A 11/01/2020   Procedure: CYSTOSCOPY WITH LITHOLAPAXY;  Surgeon: Nieves Cough, MD;  Location: [facility];  Service: Urology;  Laterality: N/A;   CYSTOSCOPY WITH LITHOLAPAXY N/A 05/18/2023   Procedure: CYSTOSCOPY WITH LITHOLAPAXY;  Surgeon: Nieves Cough, MD;  Location: [facility];  Service: Urology;  Laterality: N/A;   FRACTURE SURGERY Left 1974   wrist    KNEE ARTHROSCOPY     Left: 1990s and early 2000's.   LITHOTRIPSY     1990s   PROSTATE BIOPSY  12/2014; 02/2019   2016 benign.  02/2019  benign.   ROTATOR CUFF REPAIR Right 2004   Right   THROAT SURGERY     Infected branchial cleft cyst 2007   TOTAL KNEE ARTHROPLASTY Left 03/10/2016   Procedure: TOTAL KNEE ARTHROPLASTY;  Surgeon: Maude Herald, MD;  Location: [facility];  Service: Orthopedics;  Laterality: Left;    Outpatient Medications Prior to Visit  Medication Sig Dispense Refill   acetaminophen  (TYLENOL ) 500 MG tablet Take 1,000 mg by mouth every 6 (six) hours as needed for moderate pain (pain score 4-6).     lisinopril  (ZESTRIL ) 20 MG tablet 2 tabs po qd     methylphenidate  54 MG PO CR tablet Take 1 tablet (54 mg total) by mouth every morning. 90  tablet 0   naproxen  (NAPROSYN ) 500 MG tablet 1 tab p.o. bid as needed for pain 60 tablet 1   omeprazole (PRILOSEC) 20 MG capsule Take 20 mg by mouth daily.     oxybutynin  (DITROPAN ) 5 MG tablet Take 1 tablet (5 mg total) by mouth every 8 (eight) hours as needed for bladder spasms. (Patient not taking: Reported on 05/05/2024) 30 tablet 1   phenazopyridine  (PYRIDIUM ) 200 MG tablet Take 1 tablet (200 mg total) by mouth 3 (three) times daily as needed (for pain with urination). (Patient not taking: Reported on 05/05/2024) 30 tablet 0   polyethylene glycol (MIRALAX ) 17 g packet Take 17 g by mouth daily. 14 each 0   tamsulosin  (FLOMAX ) 0.4 MG CAPS capsule Take 1 capsule (0.4 mg total) by mouth daily after supper. 45 capsule 0   No facility-administered medications prior to visit.    Allergies[1]  Review of Systems As per HPI  PE:    05/26/2024    3:50 PM 05/26/2024    3:29 PM 05/05/2024    2:16 PM  Vitals with BMI  Height  6' 0.5   Weight  278 lbs 13 oz   BMI  37.27   Systolic 140 159 854  Diastolic 80 80 70  Pulse  77      Physical Exam  ***  LABS:  Last metabolic panel Lab Results  Component Value Date   GLUCOSE 87 05/05/2024   NA 140 05/05/2024   K 4.4 05/05/2024   CL 103 05/05/2024   CO2 28 05/05/2024   BUN 16 05/05/2024   CREATININE 0.86 05/05/2024   EGFR 95 05/05/2024   CALCIUM 9.7 05/05/2024   PROT 7.2 05/05/2024   ALBUMIN 4.7 05/17/2023   BILITOT 0.6 05/05/2024   ALKPHOS 74 05/17/2023   AST 26 05/05/2024   ALT 37 05/05/2024   ANIONGAP 11 09/23/2023   Lab Results  Component Value Date   PSA 13.30 03/19/2023   PSA 13.3 03/19/2023   PSA 17.10 11/27/2022   IMPRESSION AND PLAN:  No problem-specific Assessment & Plan notes found for this encounter.   An After Visit Summary was printed and given to the patient.  FOLLOW UP: No follow-ups on file. Next CPE September 2026 Signed:  Gerlene Hockey, MD           09/01/2024      [1]  Allergies Allergen  Reactions   Amlodipine  Other (See Comments)    disequilibrium   Sulfa Antibiotics Rash
